# Patient Record
Sex: Female | Born: 1950
Health system: Southern US, Community
[De-identification: ages and names within clinical notes are randomized; demographics above are authoritative.]

## PROBLEM LIST (undated history)

## (undated) ENCOUNTER — Inpatient Hospital Stay (AMBULATORY_SURGERY_CENTER): Payer: Medicare Other | Admitting: Podiatry

## (undated) DIAGNOSIS — M1990 Unspecified osteoarthritis, unspecified site: Secondary | ICD-10-CM

## (undated) DIAGNOSIS — I499 Cardiac arrhythmia, unspecified: Secondary | ICD-10-CM

## (undated) DIAGNOSIS — G4700 Insomnia, unspecified: Secondary | ICD-10-CM

## (undated) DIAGNOSIS — F32A Depression, unspecified: Secondary | ICD-10-CM

## (undated) DIAGNOSIS — F419 Anxiety disorder, unspecified: Secondary | ICD-10-CM

## (undated) DIAGNOSIS — E039 Hypothyroidism, unspecified: Secondary | ICD-10-CM

## (undated) DIAGNOSIS — R51 Headache: Secondary | ICD-10-CM

## (undated) DIAGNOSIS — F329 Major depressive disorder, single episode, unspecified: Secondary | ICD-10-CM

## (undated) DIAGNOSIS — M549 Dorsalgia, unspecified: Secondary | ICD-10-CM

## (undated) DIAGNOSIS — R519 Headache, unspecified: Secondary | ICD-10-CM

## (undated) HISTORY — PX: TONSILLECTOMY: SUR1361

## (undated) HISTORY — DX: Headache: R51

## (undated) HISTORY — PX: KNEE ARTHROSCOPY: SUR90

## (undated) HISTORY — DX: Major depressive disorder, single episode, unspecified: F32.9

## (undated) HISTORY — DX: Headache, unspecified: R51.9

## (undated) HISTORY — PX: BUNIONECTOMY: SHX129

## (undated) HISTORY — DX: Dorsalgia, unspecified: M54.9

## (undated) HISTORY — DX: Hypothyroidism, unspecified: E03.9

## (undated) HISTORY — PX: CATARACT EXTRACTION, BILATERAL: SHX1313

## (undated) HISTORY — PX: TOE SURGERY: SHX1073

## (undated) HISTORY — DX: Depression, unspecified: F32.A

## (undated) HISTORY — PX: BLADDER SURGERY: SHX569

## (undated) HISTORY — DX: Anxiety disorder, unspecified: F41.9

## (undated) HISTORY — DX: Insomnia, unspecified: G47.00

---

## 2006-03-11 HISTORY — PX: REPLACEMENT TOTAL KNEE: SUR1224

## 2010-03-11 HISTORY — PX: REPLACEMENT TOTAL KNEE: SUR1224

## 2015-07-20 ENCOUNTER — Ambulatory Visit (INDEPENDENT_AMBULATORY_CARE_PROVIDER_SITE_OTHER)
Admission: RE | Admit: 2015-07-20 | Discharge: 2015-07-20 | Disposition: A | Discharge status: Home / Self Care | Payer: BLUE CROSS/BLUE SHIELD | Source: Ambulatory Visit | Attending: Pulmonary Disease | Admitting: Pulmonary Disease

## 2015-07-20 ENCOUNTER — Encounter: Payer: Self-pay | Admitting: Pulmonary Disease

## 2015-07-20 ENCOUNTER — Ambulatory Visit (INDEPENDENT_AMBULATORY_CARE_PROVIDER_SITE_OTHER): Payer: BLUE CROSS/BLUE SHIELD | Admitting: Pulmonary Disease

## 2015-07-20 VITALS — BP 118/72 | HR 90 | Ht 62.0 in | Wt 178.0 lb

## 2015-07-20 DIAGNOSIS — R0600 Dyspnea, unspecified: Secondary | ICD-10-CM

## 2015-07-20 DIAGNOSIS — R05 Cough: Secondary | ICD-10-CM | POA: Diagnosis not present

## 2015-07-20 DIAGNOSIS — R0609 Other forms of dyspnea: Secondary | ICD-10-CM | POA: Diagnosis not present

## 2015-07-20 DIAGNOSIS — R059 Cough, unspecified: Secondary | ICD-10-CM

## 2015-07-20 MED ORDER — RANITIDINE HCL 150 MG PO TABS: 150.0000 mg | ORAL_TABLET | Freq: Two times a day (BID) | ORAL | Status: DC

## 2015-07-20 MED ORDER — FLUTICASONE PROPIONATE 50 MCG/ACT NA SUSP: 2.0000 | Freq: Every day | NASAL | Status: DC

## 2015-07-20 MED ORDER — MONTELUKAST SODIUM 10 MG PO TABS: 10.0000 mg | ORAL_TABLET | Freq: Every day | ORAL | Status: DC

## 2015-07-20 NOTE — Progress Notes (Signed)
Subjective:    Patient ID: Debbie FlakeKathryn Berkowitz, female    DOB: 11/14/1950, 65 y.o.   MRN: [id number]  HPI   This is the case of Debbie Schultz, 65 y.o. Female, who was referred by Dr. Christia Readingwight Bates in consultation regarding chronic cough.   As you very well know, patient has had the cough since 2009.  She was fine until cough slowly crept up on her. Over all, cough has been the same since 2009. What's different is the cough the last 1-1.5 yrs sometimes causes her to have HA which gets better when she lays down.  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 Last 1.5 yrs, cough causes her to have HA. Lying down helps with HA.  She does not know exact cause or trigger for cough. Sometimes associated with nasal congestion/drip, but not all the time.  Not necessarily worse cough at night. She was treated with flonase for yrs -- not really sure if cough got better. Was also treated for GERD with PPI for yrs -- not really better.   [place] [facility] [place] and drove cross country, going to [place] and [place] then finally making it here in [place] in Dec 2016. She has had cats forever, even before the cough.  She is now staying with her sister. House is old, relatively clean house, has carpets. (-) Molds.  Cough is NOT worse here compared to [facility], not worse at her current house compared [place] [facility].   Saw ENT and was given neurontin  100 mg TID.  Neurontin mad her really sleepy.   She was in a lot of stress at work last year but it is better now.  Cough was not worse with stress.   Non smoker. Denies asthma or copd.   Review of Systems  Constitutional: Negative.  Negative for fever and unexpected weight change.       Gained weight x 20 lbs x 1.5 yrs.   HENT: Negative for congestion, dental problem, ear pain, nosebleeds, postnasal drip, rhinorrhea, sinus pressure, sneezing, sore throat and trouble swallowing.   Eyes: Negative.  Negative for redness and itching.   Respiratory: Positive for cough. Negative for chest tightness, shortness of breath and wheezing.   Cardiovascular: Negative.  Negative for palpitations and leg swelling.  Gastrointestinal: Negative.  Negative for nausea and vomiting.  Endocrine: Negative.   Genitourinary: Negative.  Negative for dysuria.  Musculoskeletal: Positive for joint swelling and arthralgias.  Skin: Negative.  Negative for rash.  Allergic/Immunologic: Negative.   Neurological: Positive for headaches.  Hematological: Negative.  Does not bruise/bleed easily.  Psychiatric/Behavioral: Negative.  Negative for dysphoric mood. The patient is not nervous/anxious.   All other systems reviewed and are negative.      No past medical history on file.  (-) CA, DVT. Has depression/anxiety.  GERD Arthritis Hypothyroidism  No family history on file.  Mother has osteoporosis. Sister is healthy. Has asthma.   No past surgical history on file.  Status post cataract surgery, bilateral knee surgery with replacement, tonsillectomy, wisdom tooth extraction.  Social History   Social History  . Marital Status: Unknown    Spouse Name: N/A  . Number of Children: N/A  . Years of Education: N/A   Occupational History  . Not on file.   Social History Main Topics  . Smoking status: Never Smoker   . Smokeless tobacco: Not on file  . Alcohol Use: Not on file  .  Drug Use: Not on file  . Sexual Activity: Not on file   Other Topics Concern  . Not on file   Social History Narrative  . No narrative on file     Allergies  Allergen Reactions  . Bupropion Rash  . Sulfamethoxazole Rash   Single, no children. 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 Lived in [place] from 1970s until 1996. She went to [place] in [phone]. Was in [place], [place] from [place] until 2016.  No outpatient prescriptions prior to visit.   No facility-administered medications prior to visit.   Meds ordered this encounter  Medications  .  busPIRone (BUSPAR) 5 MG tablet    Sig: Take 1 tablet by mouth 2 (two) times daily.    Refill:  0  . gabapentin (NEURONTIN) 100 MG capsule    Sig: Take 1 capsule by mouth 3 (three) times daily.    Refill:  0  . temazepam (RESTORIL) 15 MG capsule    Sig: Take 15 mg by mouth at bedtime.    Refill:  0  . traZODone (DESYREL) 100 MG tablet    Sig: Take 300 mg by mouth at bedtime.    Refill:  0  . venlafaxine XR (EFFEXOR-XR) 150 MG 24 hr capsule    Sig: Take 450 mg by mouth at bedtime.    Refill:  0  . Omega-3 Fatty Acids (OMEGA 3 PO)    Sig: Take by mouth.  . Zinc Sulfate (ZINC 15 PO)    Sig: Take by mouth.  Marland Kitchen PROPRANOLOL HCL PO    Sig: Take 10 mg by mouth 3 (three) times daily.  . fluticasone (FLONASE) 50 MCG/ACT nasal spray    Sig: Place 2 sprays into both nostrils daily.    Dispense:  16 g    Refill:  5  . montelukast (SINGULAIR) 10 MG tablet    Sig: Take 1 tablet (10 mg total) by mouth at bedtime.    Dispense:  30 tablet    Refill:  5  . ranitidine (ZANTAC) 150 MG tablet    Sig: Take 1 tablet (150 mg total) by mouth 2 (two) times daily.    Dispense:  60 tablet    Refill:  5       Objective:   Physical Exam  Vitals:  Filed Vitals:   07/20/15 1347  BP: 118/72  Pulse: 90  Height: 5\' 2"  (1.575 m)  Weight: 178 lb (80.74 kg)  SpO2: 97%    Constitutional/General:  Pleasant, well-nourished, well-developed, not in any distress,  Comfortably seating.  Well kempt  Body mass index is 32.55 kg/(m^2). Wt Readings from Last 3 Encounters:  07/20/15 178 lb (80.74 kg)    HEENT: Pupils equal and reactive to light and accommodation. Anicteric sclerae. Normal nasal mucosa.   No oral  lesions,  mouth clear,  oropharynx clear, no postnasal drip. (-) Oral thrush. No dental caries.  Airway - Mallampati class III  Neck: No masses. Midline trachea. No JVD, (-) LAD. (-) bruits appreciated.  Respiratory/Chest: Grossly normal chest. (-) deformity. (-) Accessory muscle use.    Symmetric expansion. (-) Tenderness on palpation.  Resonant on percussion.  Diminished BS on both lower lung zones. (-) wheezing, crackles, rhonchi (-) egophony  Cardiovascular: Regular rate and  rhythm, heart sounds normal, no murmur or gallops, no peripheral edema  Gastrointestinal:  Normal bowel sounds. Soft, non-tender. No hepatosplenomegaly.  (-) masses.   Musculoskeletal:  Normal muscle tone. Normal gait.   Extremities: Grossly  normal. (-) clubbing, cyanosis.  (-) edema  Skin: (-) rash,lesions seen.   Neurological/Psychiatric : alert, oriented to time, place, person. Normal mood and affect            Assessment & Plan:  Cough Pt is being seen for cough. Cough since 2009, not affected by environment. No meds implicated. Moved from [place] to [place], moved houses, cough still the same. Was in  A lot of stress last yr > cough the same.  Has occasional sinus issues/throat clearing.  Drinking carbonated drinks makes her cough better.  Cough, etiology: Likely still multifactorial 1. UACS. Had frequent throat clearing during interview. Re start flonase 2 sq at hs. Start singulair 10 mg hs. Start Delsym 10 mls BID.  2. GERD. Try zantac 150 BID. Zantac will aslo work on allergies.  Diet changes.  3. Possible hyperreactive airway dse. May try symbicort or spiriva if not better.   Plan to hold off on neurontin as it makes her sleepy.  Plan for CXR.   Exertional dyspnea Recent SOB likely 2/2 obesity. Check CXR. May need PFT/ABG.      Thank you very much for letting me participate in this patient's care. Please do not hesitate to give me a call if you have any questions or concerns regarding the treatment plan.   Patient will follow up with me in 2-3 nus    J. Alexis Frock, MD 07/20/2015   10:14 PM Pulmonary and Critical Care Medicine [facility] Pager: [phone] Office: [phone], Fax: [phone]

## 2015-07-20 NOTE — Assessment & Plan Note (Signed)
Recent SOB likely 2/2 obesity. Check CXR. May need PFT/ABG.

## 2015-07-20 NOTE — Patient Instructions (Signed)
1. Start Flonase, 2 squirts per nostril at bedtime. 2. Start Singulair, 10 mg per tablet, 1 tablet at bedtime. 3. Start Zantac, 150 mg per tablet, 1 tablet twice a day. 4. Start Delsym, 10 ML's, twice a day. 5. We will get a chest x-ray today.  Return to clinic in 6 weeks.

## 2015-07-20 NOTE — Assessment & Plan Note (Signed)
Pt is being seen for cough. Cough since 2009, not affected by environment. No meds implicated. Moved from [place] to [place], moved houses, cough still the same. Was in  A lot of stress last yr > cough the same.  Has occasional sinus issues/throat clearing.  Drinking carbonated drinks makes her cough better.  Cough, etiology: Likely still multifactorial 1. UACS. Had frequent throat clearing during interview. Re start flonase 2 sq at hs. Start singulair 10 mg hs. Start Delsym 10 mls BID.  2. GERD. Try zantac 150 BID. Zantac will aslo work on allergies.  Diet changes.  3. Possible hyperreactive airway dse. May try symbicort or spiriva if not better.   Plan to hold off on neurontin as it makes her sleepy.  Plan for CXR.

## 2015-07-25 ENCOUNTER — Telehealth: Payer: Self-pay | Admitting: Pulmonary Disease

## 2015-07-25 NOTE — Telephone Encounter (Signed)
Spoke with pt and gave cxr results. Nothing further needed.  

## 2015-08-21 ENCOUNTER — Ambulatory Visit: Payer: BLUE CROSS/BLUE SHIELD | Admitting: Pulmonary Disease

## 2015-08-24 ENCOUNTER — Encounter (INDEPENDENT_AMBULATORY_CARE_PROVIDER_SITE_OTHER): Payer: Self-pay

## 2015-08-24 ENCOUNTER — Encounter: Payer: Self-pay | Admitting: Acute Care

## 2015-08-24 ENCOUNTER — Ambulatory Visit (INDEPENDENT_AMBULATORY_CARE_PROVIDER_SITE_OTHER): Payer: BLUE CROSS/BLUE SHIELD | Admitting: Acute Care

## 2015-08-24 DIAGNOSIS — R059 Cough, unspecified: Secondary | ICD-10-CM

## 2015-08-24 DIAGNOSIS — R05 Cough: Secondary | ICD-10-CM

## 2015-08-24 LAB — NITRIC OXIDE: Nitric Oxide: 13

## 2015-08-24 MED ORDER — PREDNISONE 10 MG PO TABS: 10.0000 mg | ORAL_TABLET | Freq: Every day | ORAL | Status: DC

## 2015-08-24 NOTE — Patient Instructions (Addendum)
We will prescribe a Prednisone taper; 10 mg tablets: 4 tabs x 2 days, 3 tabs x 2 days, 2 tabs x 2 days 1 tab x 2 days then stop. We will do a Nitrous Oxide Test in the office today. This was normal. Please use sugar free jolly rancher hard candy to soothe your throat. Avoid mint menthol and chocolate. See if cough is better off Fish oil. Use sips of water instead of throat clearing. Continue the zantac, Flonase,and Singulair. Follow up in 3 weeks  Please contact office for sooner follow up if symptoms do not improve or worsen or seek emergency care

## 2015-08-24 NOTE — Assessment & Plan Note (Signed)
Cough since 2009 FeNO today was 13 Compliant with Flonase and Singulair and Zantac Non-compliant with Delsym No Improvement in symptoms with this plan. Plan: Prednisone taper; 10 mg tablets: 4 tabs x 2 days, 3 tabs x 2 days, 2 tabs x 2 days 1 tab x 2 days then stop. Please use sugar free jolly rancher hard candy to soothe your throat. Avoid mint menthol and chocolate. See if cough is better off Fish oil. Use sips of water instead of throat clearing. Continue the zantac, Flonase,and Singulair. Follow up in 3 weeks  Please contact office for sooner follow up if symptoms do not improve or worsen or seek emergency care   Consider adding Symbicort after trial if improvement on pred. Consider PFT's

## 2015-08-24 NOTE — Progress Notes (Signed)
History of Present Illness Debbie FlakeKathryn Revell is a 65 y.o. female with chronic cough since 2009. She is followed by Dr. Christene Slatese Dios.    6/15/2017Follow up Dr. Christene Slatese Dios 07/20/2015.   In May Dr. Christene Slatese Dios prescribed Zantac and Singulair and Flonase for patient's chronic cough..He had her stop the Neurontin she was taking at that time.Patient states that she has had no relief or improvement in her cough.She was sleepy using the zantac, but has started taking it at night and this has been better. She has been compliant with the Flonase and Singulair. She is not using the Delsym because it made her too sleepy.She is currently taking fish oil and commented on using Eucalypts oil. She denies fever, chest pain, productive cough, leg or calf pain.She has been throat clearing throughout the visit.  Tests Nitrous Oxide : 13  CXR done 07/20/2015:  No active cardiopulmonary disease.   Past medical hx No past medical history on file.   Past surgical hx, Family hx, Social hx all reviewed.  Current Outpatient Prescriptions on File Prior to Visit  Medication Sig  . busPIRone (BUSPAR) 5 MG tablet Take 1 tablet by mouth 2 (two) times daily.  . fluticasone (FLONASE) 50 MCG/ACT nasal spray Place 2 sprays into both nostrils daily.  . montelukast (SINGULAIR) 10 MG tablet Take 1 tablet (10 mg total) by mouth at bedtime.  . Omega-3 Fatty Acids (OMEGA 3 PO) Take by mouth.  Marland Kitchen. PROPRANOLOL HCL PO Take 10 mg by mouth 3 (three) times daily.  . temazepam (RESTORIL) 15 MG capsule Take 15 mg by mouth at bedtime.  . traZODone (DESYREL) 100 MG tablet Take 300 mg by mouth at bedtime.  Marland Kitchen. venlafaxine XR (EFFEXOR-XR) 150 MG 24 hr capsule Take 450 mg by mouth at bedtime.  . Zinc Sulfate (ZINC 15 PO) Take by mouth.   No current facility-administered medications on file prior to visit.     Allergies  Allergen Reactions  . Bupropion Rash  . Sulfamethoxazole Rash    Review Of Systems:  Constitutional:   No  weight loss,  night sweats,  Fevers, chills, fatigue, or  lassitude.  HEENT:   +headaches,  No Difficulty swallowing,  Tooth/dental problems, or  Sore throat,                No sneezing, itching, ear ache, nasal congestion, +post nasal drip,   CV:  No chest pain,  Orthopnea, PND, swelling in lower extremities, anasarca, dizziness, palpitations, syncope.   GI  No heartburn, indigestion, abdominal pain, nausea, vomiting, diarrhea, change in bowel habits, loss of appetite, bloody stools.   Resp: No shortness of breath with exertion or at rest.  No excess mucus, no productive cough,  + non-productive cough,  No coughing up of blood.  No change in color of mucus.  No wheezing.  No chest wall deformity  Skin: no rash or lesions.  GU: no dysuria, change in color of urine, no urgency or frequency.  No flank pain, no hematuria   MS:  No joint pain or swelling.  No decreased range of motion.  No back pain.  Psych:  No change in mood or affect. No depression or anxiety.  No memory loss.   Vital Signs BP 106/70 mmHg  Pulse 79  Temp(Src) 98.8 F (37.1 C) (Oral)  Ht 5\' 2"  (1.575 m)  Wt 174 lb 6.4 oz (79.107 kg)  BMI 31.89 kg/m2  SpO2 96%   Physical Exam:  General- No distress,  A&Ox3, pleasant ENT: No sinus tenderness, TM clear, pale nasal mucosa, no oral exudate,no post nasal drip, no LAN Cardiac: S1, S2, regular rate and rhythm, no murmur Chest: No wheeze/ rales/ dullness; no accessory muscle use, no nasal flaring, no sternal retractions Abd.: Soft Non-tender Ext: No clubbing cyanosis, edema Neuro:  normal strength Skin: No rashes, warm and dry Psych: normal mood and behavior   Assessment/Plan  Cough Cough since 2009 FeNO today was 13 Compliant with Flonase and Singulair and Zantac Non-compliant with Delsym No Improvement in symptoms with this plan. Plan: Prednisone taper; 10 mg tablets: 4 tabs x 2 days, 3 tabs x 2 days, 2 tabs x 2 days 1 tab x 2 days then stop. Please use sugar free  jolly rancher hard candy to soothe your throat. Avoid mint menthol and chocolate. See if cough is better off Fish oil. Use sips of water instead of throat clearing. Continue the zantac, Flonase,and Singulair. Follow up in 3 weeks  Please contact office for sooner follow up if symptoms do not improve or worsen or seek emergency care   Consider adding Symbicort after trial if improvement on pred. Consider PFT's     Bevelyn Ngo, NP 08/24/2015  10:14 AM

## 2015-08-31 ENCOUNTER — Ambulatory Visit: Payer: BLUE CROSS/BLUE SHIELD | Admitting: Acute Care

## 2015-09-14 ENCOUNTER — Encounter: Payer: Self-pay | Admitting: Acute Care

## 2015-09-14 ENCOUNTER — Ambulatory Visit (INDEPENDENT_AMBULATORY_CARE_PROVIDER_SITE_OTHER): Payer: BLUE CROSS/BLUE SHIELD | Admitting: Acute Care

## 2015-09-14 VITALS — BP 118/86 | HR 92 | Ht 62.0 in | Wt 172.6 lb

## 2015-09-14 DIAGNOSIS — R05 Cough: Secondary | ICD-10-CM | POA: Diagnosis not present

## 2015-09-14 DIAGNOSIS — R059 Cough, unspecified: Secondary | ICD-10-CM

## 2015-09-14 MED ORDER — BUDESONIDE-FORMOTEROL FUMARATE 80-4.5 MCG/ACT IN AERO: 2.0000 | INHALATION_SPRAY | Freq: Two times a day (BID) | RESPIRATORY_TRACT | Status: DC

## 2015-09-14 NOTE — Assessment & Plan Note (Signed)
   Complete resolution of cough of with prednisone taper. Plan: We will add Symbicort 2 puffs twice daily. as maintenance treatment for your cough as it responded so well to prednisone. We will schedule you for Pulmonary Function Tests to check for asthma. Add Culturette Probiotics once daily. Please use sugar free jolly rancher hard candy to soothe your throat. Avoid mint menthol and chocolate. Continue to avoid fish oil if possible. Use sips of water instead of throat clearing. Continue the zantac, Flonase,and Singulair. Follow up in 3 months after PFT's Please contact office for sooner follow up if symptoms do not improve or worsen or seek emergency care

## 2015-09-14 NOTE — Patient Instructions (Addendum)
It is great to see you today.  I am so glad you are feeling better. We will add Symbicort 2 puffs twice daily. as maintenance treatment for your cough as it responded so well to prednisone. We will schedule you for Pulmonary Function Tests to check for asthma. Add Culturette Probiotics once daily. Please use sugar free jolly rancher hard candy to soothe your throat. Avoid mint menthol and chocolate. Continue to avoid fish oil if possible. Use sips of water instead of throat clearing. Continue the zantac, Flonase,and Singulair. Follow up in 3 months after PFT's Please contact office for sooner follow up if symptoms do not improve or worsen or seek emergency care

## 2015-09-14 NOTE — Progress Notes (Signed)
History of Present Illness Debbie Schultz is a 65 y.o. female with chronic cough since 2009. She is followed by Dr. Christene Slatese Dios.   7/6/2017Follow up appointment for cough Pt. Presents to the office today with complete resolution of cough after treatment with prednisone. She states she rarely coughs. Headache is completely resolved. She has continued treatment with Zantac, Singulair and Flonase.She is implementing sips of water instead of throat clearing, she is avoiding mint , chocolate and menthol, and stopped fish oil. She is interested in starting Symbicort inhalers as the cough was so responsive to prednisone.She denies fever, chest pain, orthopnea or hemoptysis.  Tests CXR 07/20/2015  FINDINGS: No active infiltrate or effusion is seen. Mediastinal and hilar contours are unremarkable. The heart is within normal limits in size. There are degenerative changes throughout the thoracic spine.  IMPRESSION: No active cardiopulmonary disease  Past medical hx No past medical history on file.   Past surgical hx, Family hx, Social hx all reviewed.  Current Outpatient Prescriptions on File Prior to Visit  Medication Sig  . busPIRone (BUSPAR) 5 MG tablet Take 1 tablet by mouth 2 (two) times daily.  . fluticasone (FLONASE) 50 MCG/ACT nasal spray Place 2 sprays into both nostrils daily.  . montelukast (SINGULAIR) 10 MG tablet Take 1 tablet (10 mg total) by mouth at bedtime.  . Multiple Vitamin (MULTIVITAMIN) capsule Take 1 capsule by mouth daily.  . Omega-3 Fatty Acids (OMEGA 3 PO) Take by mouth.  Marland Kitchen. PROPRANOLOL HCL PO Take 10 mg by mouth 3 (three) times daily.  . ranitidine (ZANTAC) 150 MG tablet Take 150 mg by mouth at bedtime.  . temazepam (RESTORIL) 15 MG capsule Take 15 mg by mouth at bedtime.  . traZODone (DESYREL) 100 MG tablet Take 300 mg by mouth at bedtime.  Marland Kitchen. venlafaxine XR (EFFEXOR-XR) 150 MG 24 hr capsule Take 450 mg by mouth at bedtime.  . Zinc Sulfate (ZINC 15 PO) Take by  mouth.   No current facility-administered medications on file prior to visit.     Allergies  Allergen Reactions  . Bupropion Rash  . Sulfamethoxazole Rash    Review Of Systems:  Constitutional:   No  weight loss, night sweats,  Fevers, chills, fatigue, or  lassitude.  HEENT:   No headaches,  Difficulty swallowing,  Tooth/dental problems, or  Sore throat,                No sneezing, itching, ear ache, nasal congestion, post nasal drip,   CV:  No chest pain,  Orthopnea, PND, swelling in lower extremities, anasarca, dizziness, palpitations, syncope.   GI  No heartburn, indigestion, abdominal pain, nausea, vomiting, diarrhea, change in bowel habits, loss of appetite, bloody stools.   Resp: No shortness of breath with exertion or at rest.  No excess mucus, no productive cough,  No non-productive cough,  No coughing up of blood.  No change in color of mucus.  No wheezing.  No chest wall deformity  Skin: no rash or lesions.  GU: no dysuria, change in color of urine, no urgency or frequency.  No flank pain, no hematuria   MS:  No joint pain or swelling.  No decreased range of motion.  No back pain.  Psych:  No change in mood or affect. No depression or anxiety.  No memory loss.   Vital Signs BP 118/86 mmHg  Pulse 92  Ht 5\' 2"  (1.575 m)  Wt 172 lb 9.6 oz (78.291 kg)  BMI 31.56 kg/m2  SpO2 95%   Physical Exam:  General- No distress,  A&Ox3, pleasant ENT: No sinus tenderness, TM clear, pale nasal mucosa, no oral exudate,no post nasal drip, no LAN Cardiac: S1, S2, regular rate and rhythm, no murmur Chest: No wheeze/ rales/ dullness; no accessory muscle use, no nasal flaring, no sternal retractions Abd.: Soft Non-tender Ext: No clubbing cyanosis, edema Neuro:  normal strength Skin: No rashes, warm and dry Psych: normal mood and behavior   Assessment/Plan  Cough   Complete resolution of cough of with prednisone taper. Plan: We will add Symbicort 2 puffs twice daily. as  maintenance treatment for your cough as it responded so well to prednisone. We will schedule you for Pulmonary Function Tests to check for asthma. Add Culturette Probiotics once daily. Please use sugar free jolly rancher hard candy to soothe your throat. Avoid mint menthol and chocolate. Continue to avoid fish oil if possible. Use sips of water instead of throat clearing. Continue the zantac, Flonase,and Singulair. Follow up in 3 months after PFT's Please contact office for sooner follow up if symptoms do not improve or worsen or seek emergency care       Debbie NgoSarah F Groce, NP 09/14/2015  10:12 AM

## 2015-09-14 NOTE — Addendum Note (Signed)
Addended by: Maisie FusGREEN, Jaryd Drew M on: 09/14/2015 10:23 AM   Modules accepted: Orders

## 2015-09-14 NOTE — Addendum Note (Signed)
Addended by: Maisie FusGREEN, Hilman Kissling M on: 09/14/2015 10:22 AM   Modules accepted: Orders, Medications

## 2015-09-22 ENCOUNTER — Telehealth: Payer: Self-pay | Admitting: Acute Care

## 2015-09-22 MED ORDER — FLUTICASONE-SALMETEROL 115-21 MCG/ACT IN AERO: 2.0000 | INHALATION_SPRAY | Freq: Two times a day (BID) | RESPIRATORY_TRACT | Status: DC

## 2015-09-22 NOTE — Telephone Encounter (Signed)
Spoke with pt, states that symbicort is making her nauseous and worsening her cough.  I've advised pt to stop taking medication.  Pt is requesting alternative inhaler to be sent to below verified pharmacy.    SG please advise.  Thanks!

## 2015-09-22 NOTE — Telephone Encounter (Signed)
Pt aware of recs.  rx sent to pharmacy.  Nothing further needed.  

## 2015-09-22 NOTE — Telephone Encounter (Signed)
Please send in a prescription for Advair HFA 115/21 with instructions to inhale 2 puffs twice daily. Please tell her to let us know if this problem persists on the new inhaler so that we can see her in the office if necessary. Thanks

## 2015-09-28 ENCOUNTER — Telehealth: Payer: Self-pay | Admitting: Acute Care

## 2015-09-28 NOTE — Telephone Encounter (Signed)
Spoke with pt  She states cough has been worse since last visit  She coughs until she gets severe HA's  OV with MW for 4:30 pm tomorrow with all meds in hand

## 2015-09-29 ENCOUNTER — Ambulatory Visit (INDEPENDENT_AMBULATORY_CARE_PROVIDER_SITE_OTHER): Payer: BLUE CROSS/BLUE SHIELD | Admitting: Internal Medicine

## 2015-09-29 ENCOUNTER — Encounter: Payer: Self-pay | Admitting: Internal Medicine

## 2015-09-29 VITALS — BP 130/76 | HR 101 | Ht 62.0 in | Wt 160.0 lb

## 2015-09-29 DIAGNOSIS — R05 Cough: Secondary | ICD-10-CM

## 2015-09-29 DIAGNOSIS — R059 Cough, unspecified: Secondary | ICD-10-CM

## 2015-09-29 MED ORDER — PANTOPRAZOLE SODIUM 40 MG PO TBEC: 40.0000 mg | DELAYED_RELEASE_TABLET | Freq: Every day | ORAL | Status: DC

## 2015-09-29 MED ORDER — PREDNISONE 10 MG PO TABS: ORAL_TABLET | ORAL | Status: DC

## 2015-09-29 NOTE — Progress Notes (Signed)
Subjective:    Patient ID: Debbie Schultz, female    DOB: 1950-12-28, 65 y.o.   MRN: [id number]  HPI   This is the case of Debbie Schultz, 65 y.o. Female, who was referred by Dr. Christia Reading in consultation regarding chronic cough.   patient has had the cough since 2009.  She was fine until cough slowly crept up on her. Over all, cough has been the same since 2009. What's different is the cough the last 1-1.5 yrs sometimes causes her to have HA which gets better when she lies down.  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 Last 1.5 yrs, cough causes her to have HA. Lying down helps with HA.  She does not know exact cause or trigger for cough. Sometimes associated with nasal congestion/drip, but not all the time.  Not necessarily worse cough at night. She was treated with flonase for yrs -- not really sure if cough got better. Was also treated for GERD with PPI for yrs -- not really better.   She left [facility] in Oct 2016 and drove cross country, going to [place] and [place] then finally making it to [place] in Dec 2016. She has had cats forever, even before the cough.  She is now staying with her sister. House is old, relatively clean house, has carpets. (-) Molds.  Cough is NOT worse here compared to [facility], not worse at her current house compared to house in [facility].   Saw ENT and was given neurontin  100 mg TID.  Neurontin made her really sleepy.  07/20/15 recs 1. Start Flonase, 2 squirts per nostril at bedtime. 2. Start Singulair, 10 mg per tablet, 1 tablet at bedtime. 3. Start Zantac, 150 mg per tablet, 1 tablet twice a day. 4. Start Delsym, 10 ML's, twice a day.  08/24/15 recs   Prednisone taper; 10 mg tablets: 4 tabs x 2 days, 3 tabs x 2 days, 2 tabs x 2 days 1 tab x 2 days then stop.   Nitrous Oxide Test in the office  was normal. Please use sugar free jolly rancher hard candy to soothe your throat. Avoid mint menthol and chocolate. See if cough is  better off Fish oil. Use sips of water instead of throat clearing. Continue the zantac, Flonase,and Singulair.   09/14/15 rec  We will add Symbicort 2 puffs twice daily. as maintenance treatment for your cough as it responded so well to prednisone. We will schedule you for Pulmonary Function Tests to check for asthma. Add Culturette Probiotics once daily. Please use sugar free jolly rancher hard candy to soothe your throat. Avoid mint menthol and chocolate. Continue to avoid fish oil if possible. Use sips of water instead of throat clearing. Continue the zantac, Flonase,and Singulair. Follow up in 3 months after PFT's     09/29/2015 acute extended ov/Katherene Dinino re:  Chronic cough  X  8 years transiently better on pred but not symbicort or advair Chief Complaint  Patient presents with  . Acute Visit    Pt c/o cough for the past few wks. Cough is non prod "deep and hacky". She coughs until she gets a severe HA.   started propranol for high heart rate x 2 y prior to OV   Cough is day and noct = and triggered by using voice   No obvious day to day or daytime variability or assoc sob or excess/ purulent sputum or mucus plugs or hemoptysis or cp or chest  tightness, subjective wheeze or overt sinus or hb symptoms. No unusual exp hx or h/o childhood pna/ asthma or knowledge of premature birth.  Sleeping ok without nocturnal  or early am exacerbation  of respiratory  c/o's or need for noct saba. Also denies any obvious fluctuation of symptoms with weather or environmental changes or other aggravating or alleviating factors except as outlined above   Current Medications, Allergies, Complete Past Medical History, Past Surgical History, Family History, and Social History were reviewed in [facility] electronic medical record.  ROS  The following are not active complaints unless bolded sore throat, dysphagia, dental problems, itching, sneezing,  nasal congestion or excess/ purulent secretions, ear  ache,   fever, chills, sweats, unintended wt loss, classically pleuritic or exertional cp,  orthopnea pnd or leg swelling, presyncope, palpitations, abdominal pain, anorexia, nausea, vomiting, diarrhea  or change in bowel or bladder habits, change in stools or urine, dysuria,hematuria,  rash, arthralgias, visual complaints, headache, numbness, weakness or ataxia or problems with walking or coordination,  change in mood/affect or memory.                     No past medical history on file.  (-) CA, DVT. Has depression/anxiety.  GERD Arthritis Hypothyroidism  No family history on file.  Mother has osteoporosis. Sister is healthy. Has asthma.   No past surgical history on file.  Status post cataract surgery, bilateral knee surgery with replacement, tonsillectomy, wisdom tooth extraction.     Objective:   Physical Exam   amb wf very hoarse / nad freq throat clearing   Wt Readings from Last 3 Encounters:  09/29/15 160 lb (72.576 kg)  09/14/15 172 lb 9.6 oz (78.291 kg)  08/24/15 174 lb 6.4 oz (79.107 kg)    Vital signs reviewed    Constitutional/General:  Pleasant, well-nourished, well-developed, not in any distress,  Comfortably seating.  Well kempt  HEENT: Pupils equal and reactive to light and accommodation. Anicteric sclerae. Normal nasal mucosa.   No oral  lesions,  mouth clear,  oropharynx clear, no postnasal drip. (-) Oral thrush. No dental caries.  Airway - Mallampati class III  Neck: No masses. Midline trachea. No JVD, (-) LAD. (-) bruits appreciated.  Respiratory/Chest: Grossly normal chest. (-) deformity. (-) Accessory muscle use.  Clear to A and P with cough tending to occur early on insp  Cardiovascular: Regular rate and  rhythm, heart sounds normal, no murmur or gallops, no peripheral edema  Gastrointestinal:  Normal bowel sounds. Soft, non-tender. No hepatosplenomegaly.  (-) masses.   Musculoskeletal:  Normal muscle tone. Normal gait.    Extremities: Grossly normal. (-) clubbing, cyanosis.  (-) edema  Skin: (-) rash,lesions seen.   Neurological/Psychiatric : alert, oriented to time, place, person. Normal mood and affect       I personally reviewed images and agree with radiology impression as follows:  CXR:  07/20/15  No active infiltrate or effusion is seen. Mediastinal and hilar contours are unremarkable. The heart is within normal limits in size. There are degenerative changes throughout the thoracic spine.     Assessment & Plan:

## 2015-09-29 NOTE — Patient Instructions (Addendum)
Prednisone 10 mg take  4 each am x 2 days,   2 each am x 2 days,  1 each am x 2 days and stop   Pantoprazole (protonix) 40 mg   Take  30-60 min before first meal of the day and  Zantac 150 @  bedtime until return to office - this is the best way to tell whether stomach acid is contributing to your problem.    GERD (REFLUX)  is an extremely common cause of respiratory symptoms just like yours , many times with no obvious heartburn at all.    It can be treated with medication, but also with lifestyle changes including elevation of the head of your bed (ideally with 6 inch  bed blocks),  Smoking cessation, avoidance of late meals, excessive alcohol, and avoid fatty foods, chocolate, peppermint, colas, red wine, and acidic juices such as orange juice.  NO MINT OR MENTHOL PRODUCTS SO NO COUGH DROPS  USE SUGARLESS CANDY INSTEAD (Jolley ranchers or Stover's or Life Savers) or even ice chips will also do - the key is to swallow to prevent all throat clearing. NO OIL BASED VITAMINS - use powdered substitutes.  For drainage / throat tickle try take CHLORPHENIRAMINE  4 mg - take one every 4 hours as needed - available over the counter- may cause drowsiness so start with just a bedtime dose or two and see how you tolerate it before trying in daytime     Stop inhalers  - may need to stop propranol (highest incidence of asthma of all the beta blockers)  Keep follow up

## 2015-10-01 ENCOUNTER — Encounter: Payer: Self-pay | Admitting: Internal Medicine

## 2015-10-01 NOTE — Assessment & Plan Note (Addendum)
Reported improvement in cough on pred but not maintained on symbicort 80 2bid with low baseline FENO  The most common causes of chronic cough in immunocompetent adults include the following: upper airway cough syndrome (UACS), previously referred to as postnasal drip syndrome (PNDS), which is caused by variety of rhinosinus conditions; (2) asthma; (3) GERD; (4) chronic bronchitis from cigarette smoking or other inhaled environmental irritants; (5) nonasthmatic eosinophilic bronchitis; and (6) bronchiectasis.   These conditions, singly or in combination, have accounted for up to 94% of the causes of chronic cough in prospective studies.   Other conditions have constituted no >6% of the causes in prospective studies These have included bronchogenic carcinoma, chronic interstitial pneumonia, sarcoidosis, left ventricular failure, ACEI-induced cough, and aspiration from a condition associated with pharyngeal dysfunction.    Chronic cough is often simultaneously caused by more than one condition. A single cause has been found from 38 to 82% of the time, multiple causes from 18 to 62%. Multiply caused cough has been the result of three diseases up to 42% of the time.   Lack of cough resolution on a verified empirical regimen could mean an alternative diagnosis (not cough variant asthma, though would need methacholine challenge to compltely r/o) persistence of the disease state (eg sinusitis or bronchiectasis) , or inadequacy of currently available therapy (eg no medical rx available for non-acid gerd)   Despite pred response and chornic exp to propranolol most likely this is not asthma but  Upper airway cough syndrome, so named because it's frequently impossible to sort out how much is  CR/sinusitis with freq throat clearing (which can be related to primary GERD)   vs  causing  secondary (" extra esophageal")  GERD from wide swings in gastric pressure that occur with throat clearing, often  promoting self use  of mint and menthol lozenges that reduce the lower esophageal sphincter tone and exacerbate the problem further in a cyclical fashion.   These are the same pts (now being labeled as having "irritable larynx syndrome" by some cough centers) who not infrequently have a history of having failed to tolerate ace inhibitors,  dry powder inhalers (and sometimes even low doses of ICS) or biphosphonates or report having atypical reflux symptoms that don't respond to standard doses of PPI , and are easily confused as having aecopd or asthma flares by even experienced allergists/ pulmonologists.    rec rechallenge with prednisone but this time no symbicort (which may aggravate uacs) and max rx for gerd and pnds with 1st gen antihistamines per protocol  I had an extended discussion with the patient reviewing all relevant studies completed to date and  lasting  25 minutes of a acute visit focusing on recurrent severe refractory symptom control.  The standardized cough guidelines published in Chest by [place] in 2006 are still the best available and consist of a multiple step process (up to 12!) , not a single office visit,  and are intended  to address this problem logically,  with an alogrithm dependent on response to empiric treatment at  each progressive step  to determine a specific diagnosis with  minimal addtional testing needed. Therefore if adherence is an issue or can't be accurately verified,  it's very unlikely the standard evaluation and treatment will be successful here.    Furthermore, response to therapy (other than acute cough suppression, which should only be used short term with avoidance of narcotic containing cough syrups if possible), can be a gradual process for which  the patient is not likely to  perceive immediate benefit.  Unlike going to an eye doctor where the best perscription is almost always the first one and is immediately effective, this is almost never the case in the  management of chronic cough syndromes. Therefore the patient needs to commit up front to consistently adhere to recommendations  for up to 6 weeks of therapy directed at the likely underlying problem(s) before the response can be reasonably evaluated.   Each maintenance medication was reviewed in detail including most importantly the difference between maintenance and as needed and under what circumstances the prns are to be used.  Please see instructions for details which were reviewed in writing and the patient given a copy.

## 2015-10-09 ENCOUNTER — Other Ambulatory Visit: Payer: Self-pay | Admitting: Family

## 2015-10-09 DIAGNOSIS — G4483 Primary cough headache: Secondary | ICD-10-CM

## 2015-10-27 ENCOUNTER — Other Ambulatory Visit: Payer: BLUE CROSS/BLUE SHIELD

## 2015-11-07 ENCOUNTER — Ambulatory Visit
Admission: RE | Admit: 2015-11-07 | Discharge: 2015-11-07 | Disposition: A | Discharge status: Home / Self Care | Payer: Medicare Other | Source: Ambulatory Visit | Attending: Family | Admitting: Family

## 2015-11-07 DIAGNOSIS — G4483 Primary cough headache: Secondary | ICD-10-CM

## 2015-11-29 ENCOUNTER — Encounter (INDEPENDENT_AMBULATORY_CARE_PROVIDER_SITE_OTHER): Payer: Medicare Other | Admitting: Pulmonary Disease

## 2015-11-29 ENCOUNTER — Encounter: Payer: Self-pay | Admitting: Pulmonary Disease

## 2015-11-29 ENCOUNTER — Ambulatory Visit (INDEPENDENT_AMBULATORY_CARE_PROVIDER_SITE_OTHER): Payer: Medicare Other | Admitting: Pulmonary Disease

## 2015-11-29 ENCOUNTER — Other Ambulatory Visit (INDEPENDENT_AMBULATORY_CARE_PROVIDER_SITE_OTHER): Payer: Medicare Other

## 2015-11-29 VITALS — BP 122/72 | HR 90 | Ht 62.0 in | Wt 171.0 lb

## 2015-11-29 DIAGNOSIS — R059 Cough, unspecified: Secondary | ICD-10-CM

## 2015-11-29 DIAGNOSIS — R05 Cough: Secondary | ICD-10-CM

## 2015-11-29 DIAGNOSIS — Z23 Encounter for immunization: Secondary | ICD-10-CM | POA: Diagnosis not present

## 2015-11-29 DIAGNOSIS — E669 Obesity, unspecified: Secondary | ICD-10-CM

## 2015-11-29 DIAGNOSIS — R0600 Dyspnea, unspecified: Secondary | ICD-10-CM | POA: Diagnosis not present

## 2015-11-29 DIAGNOSIS — R0609 Other forms of dyspnea: Secondary | ICD-10-CM

## 2015-11-29 LAB — CBC WITH DIFFERENTIAL/PLATELET
Basophils Absolute: 0 10*3/uL (ref 0.0–0.1)
Basophils Relative: 0.4 % (ref 0.0–3.0)
EOS ABS: 0.2 10*3/uL (ref 0.0–0.7)
Eosinophils Relative: 2.4 % (ref 0.0–5.0)
HCT: 39.9 % (ref 36.0–46.0)
HEMOGLOBIN: 13.9 g/dL (ref 12.0–15.0)
LYMPHS ABS: 2 10*3/uL (ref 0.7–4.0)
Lymphocytes Relative: 27.1 % (ref 12.0–46.0)
MCHC: 34.7 g/dL (ref 30.0–36.0)
MCV: 91.2 fl (ref 78.0–100.0)
MONO ABS: 0.5 10*3/uL (ref 0.1–1.0)
Monocytes Relative: 7.1 % (ref 3.0–12.0)
NEUTROS PCT: 63 % (ref 43.0–77.0)
Neutro Abs: 4.6 10*3/uL (ref 1.4–7.7)
Platelets: 292 10*3/uL (ref 150.0–400.0)
RBC: 4.38 Mil/uL (ref 3.87–5.11)
RDW: 12.7 % (ref 11.5–15.5)
WBC: 7.3 10*3/uL (ref 4.0–10.5)

## 2015-11-29 LAB — PULMONARY FUNCTION TEST
DL/VA % PRED: 95 %
DL/VA: 4.35 ml/min/mmHg/L
DLCO COR % PRED: 81 %
DLCO COR: 17.5 ml/min/mmHg
DLCO UNC % PRED: 85 %
DLCO unc: 18.35 ml/min/mmHg
FEF 25-75 PRE: 2.85 L/s
FEF 25-75 Post: 2.61 L/sec
FEF2575-%CHANGE-POST: -8 %
FEF2575-%PRED-POST: 130 %
FEF2575-%PRED-PRE: 142 %
FEV1-%CHANGE-POST: -1 %
FEV1-%Pred-Post: 97 %
FEV1-%Pred-Pre: 99 %
FEV1-Post: 2.17 L
FEV1-Pre: 2.21 L
FEV1FVC-%Change-Post: 2 %
FEV1FVC-%PRED-PRE: 109 %
FEV6-%CHANGE-POST: -4 %
FEV6-%PRED-POST: 89 %
FEV6-%Pred-Pre: 93 %
FEV6-Post: 2.51 L
FEV6-Pre: 2.62 L
FEV6FVC-%Pred-Post: 104 %
FEV6FVC-%Pred-Pre: 104 %
FVC-%Change-Post: -4 %
FVC-%Pred-Post: 86 %
FVC-%Pred-Pre: 89 %
FVC-Post: 2.51 L
FVC-Pre: 2.62 L
POST FEV1/FVC RATIO: 87 %
Post FEV6/FVC ratio: 100 %
Pre FEV1/FVC ratio: 84 %
Pre FEV6/FVC Ratio: 100 %
RV % pred: 81 %
RV: 1.63 L
TLC % pred: 89 %
TLC: 4.24 L

## 2015-11-29 LAB — COMPREHENSIVE METABOLIC PANEL
ALBUMIN: 3.9 g/dL (ref 3.5–5.2)
ALK PHOS: 70 U/L (ref 39–117)
ALT: 34 U/L (ref 0–35)
AST: 17 U/L (ref 0–37)
BILIRUBIN TOTAL: 0.3 mg/dL (ref 0.2–1.2)
BUN: 23 mg/dL (ref 6–23)
CO2: 31 mEq/L (ref 19–32)
CREATININE: 0.91 mg/dL (ref 0.40–1.20)
Calcium: 9.1 mg/dL (ref 8.4–10.5)
Chloride: 105 mEq/L (ref 96–112)
GFR: 65.92 mL/min (ref >60.00)
GLUCOSE: 95 mg/dL (ref 70–99)
Potassium: 3.9 mEq/L (ref 3.5–5.1)
Sodium: 140 mEq/L (ref 135–145)
Total Protein: 6.6 g/dL (ref 6.0–8.3)

## 2015-11-29 NOTE — Patient Instructions (Signed)
It was a pleasure taking care of you today!  Your cough is most likely multifactorial. Likely related to the following: A. Upper Airway Cough Syndrome  Cont Flonase 2 squirts per nostril at HS  Cont Singulair 10 mg/tab, 1 tab at HS   B. GERD  Cont Protonix in am, Zantac at bedtime  Advised on dietary changes. Avoid food that will make your reflux worse.   Try to keep head of bed 30 degrees elevated.  Wait 2 hours at least after your last meal before you lie down   We will get a chest CT scan as well as a neck CT scan. We will get blood work today.  Return to clinic in 2 months

## 2015-11-29 NOTE — Assessment & Plan Note (Signed)
Weight reduction 

## 2015-11-29 NOTE — Assessment & Plan Note (Signed)
Recent SOB likely 2/2 obesity. Chest x-ray and PFT unremarkable. Observe. 

## 2015-11-29 NOTE — Assessment & Plan Note (Addendum)
Pt is being seen for cough. Cough since 2009, not affected by environment. No meds implicated. [place]oved from [place], moved houses, cough still the same. Was in  A lot of stress last yr > cough the same.  Has occasional sinus issues/throat clearing.  Drinking carbonated drinks makes her cough better.  CXR 07/20/15 No abn FENO 08/24/15 = 13 PFT 11/29/15 No obstruction. No significant bronchodilator response. Normal diffusion capacity  Cough, etiology: Likely still multifactorial 1. UACS. Still had frequent throat clearing during interview. Improved with PO prednisone. Not better with MDIs. MRI of the brain (10/2015) with some sinusitis. Cont flonase 2 sq at hs. cont singulair 10 mg hs. ENT saw the pt before I did.  I am not sure if she has neck issues/LAD causing reflex cough. Plan for chest ct scan with neck ct scan to R/O obstuction or LAD. Check respiratory allergy panel which can potentially trigger cough.  Hold off on Symbicort or other MDIs pending tests.   2. GERD. Cont PPI in am and Zantac at HS.   3. Less likely to have hyperreactive airway disease. She improved with prednisone but felt nauseated with Symbicort. PFT (11/2015) N. Holding off on further meds pending workup.  4. Patient is scheduled to see a neurologist. In the past, she would have headaches with her cough.

## 2015-11-29 NOTE — Progress Notes (Signed)
Subjective:    Patient ID: Debbie Schultz, female    DOB: 02-16-1951, 65 y.o.   MRN: [id number]  HPI   This is the case of Debbie Schultz, 65 y.o. Female, who was referred by Dr. Christia Reading in consultation regarding chronic cough.   As you very well know, patient has had the cough since 2009.  She was fine until cough slowly crept up on her. Over all, cough has been the same since 2009. What's different is the cough the last 1-1.5 yrs sometimes causes her to have HA which gets better when she lays down.  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 Last 1.5 yrs, cough causes her to have HA. Lying down helps with HA.  She does not know exact cause or trigger for cough. Sometimes associated with nasal congestion/drip, but not all the time.  Not necessarily worse cough at night. She was treated with flonase for yrs -- not really sure if cough got better. Was also treated for GERD with PPI for yrs -- not really better.   She left [facility] in Oct 2016 and drove cross country, going to [place] and [place] then finally making it here in [place] in Dec 2016. She has had cats forever, even before the cough.  She is now staying with her sister. House is old, relatively clean house, has carpets. (-) Molds.  Cough is NOT worse here compared to [facility], not worse at her current house compared to house in [facility].   Saw ENT and was given neurontin  100 mg TID.  Neurontin mad her really sleepy.   She was in a lot of stress at work last year but it is better now.  Cough was not worse with stress.   Non smoker. Denies asthma or copd.   ROV 11/29/2015  Patient returns to the office as follow-up on her cough. Since seeing her in May, she has seen Dr. Sherene Sires a couple of times. Her Nitric oxide test was negative. She was placed on prednisone taper which made the cough go away.  She was tried on Symbicort which did not help the cough. Symbicort made her nauseated. She Is back with her  baseline cough although HA is gone.    Review of Systems  Constitutional: Negative.  Negative for fever and unexpected weight change.       Gained weight x 20 lbs x 1.5 yrs.   HENT: Negative for congestion, dental problem, ear pain, nosebleeds, postnasal drip, rhinorrhea, sinus pressure, sneezing, sore throat and trouble swallowing.   Eyes: Negative.  Negative for redness and itching.  Respiratory: Positive for cough. Negative for chest tightness, shortness of breath and wheezing.   Cardiovascular: Negative.  Negative for palpitations and leg swelling.  Gastrointestinal: Negative.  Negative for nausea and vomiting.  Endocrine: Negative.   Genitourinary: Negative.  Negative for dysuria.  Musculoskeletal: Positive for arthralgias and joint swelling.  Skin: Negative.  Negative for rash.  Allergic/Immunologic: Negative.   Hematological: Negative.  Does not bruise/bleed easily.  Psychiatric/Behavioral: Negative.  Negative for dysphoric mood. The patient is not nervous/anxious.   All other systems reviewed and are negative.  No past medical history on file.   No family history on file.   No past surgical history on file.  Social History   Social History  . Marital status: Unknown    Spouse name: N/A  . Number of children: N/A  . Years of education: N/A   Occupational History  .  Not on file.   Social History Main Topics  . Smoking status: Never Smoker  . Smokeless tobacco: Not on file  . Alcohol use Not on file  . Drug use: Unknown  . Sexual activity: Not on file   Other Topics Concern  . Not on file   Social History Narrative  . No narrative on file     Allergies  Allergen Reactions  . Symbicort [Budesonide-Formoterol Fumarate] Nausea Only  . Bupropion Rash  . Sulfamethoxazole Rash     Outpatient Medications Prior to Visit  Medication Sig Dispense Refill  . busPIRone (BUSPAR) 5 MG tablet Take 1 tablet by mouth 2 (two) times daily.  0  . fluticasone (FLONASE) 50  MCG/ACT nasal spray Place 2 sprays into both nostrils daily. 16 g 5  . LIOTHYRONINE SODIUM PO Take 1 tablet by mouth 2 (two) times daily.    . montelukast (SINGULAIR) 10 MG tablet Take 1 tablet (10 mg total) by mouth at bedtime. 30 tablet 5  . Multiple Vitamin (MULTIVITAMIN) capsule Take 1 capsule by mouth daily.    . pantoprazole (PROTONIX) 40 MG tablet Take 1 tablet (40 mg total) by mouth daily. Take 30-60 min before first meal of the day 30 tablet 2  . PROPRANOLOL HCL PO Take 10 mg by mouth 3 (three) times daily.    . ranitidine (ZANTAC) 150 MG tablet Take 150 mg by mouth at bedtime.    . temazepam (RESTORIL) 15 MG capsule Take 15 mg by mouth at bedtime.  0  . traZODone (DESYREL) 100 MG tablet Take 300 mg by mouth at bedtime.  0  . venlafaxine XR (EFFEXOR-XR) 150 MG 24 hr capsule Take 450 mg by mouth at bedtime.  0  . Zinc Sulfate (ZINC 15 PO) Take by mouth.    . predniSONE (DELTASONE) 10 MG tablet Take  4 each am x 2 days,   2 each am x 2 days,  1 each am x 2 days and stop 14 tablet 0   No facility-administered medications prior to visit.    Meds ordered this encounter  Medications  . indomethacin (INDOCIN SR) 75 MG CR capsule    Sig: Take 2 capsules by mouth daily.    Refill:  0         Objective:   Physical Exam  Vitals:  Vitals:   11/29/15 1346  BP: 122/72  Pulse: 90  SpO2: 95%  Weight: 171 lb (77.6 kg)  Height: 5\' 2"  (1.575 m)    Constitutional/General:  Pleasant, well-nourished, well-developed, not in any distress,  Comfortably seating.  Well kempt  Body mass index is 31.28 kg/m. Wt Readings from Last 3 Encounters:  11/29/15 171 lb (77.6 kg)  09/29/15 160 lb (72.6 kg)  09/14/15 172 lb 9.6 oz (78.3 kg)    HEENT: Pupils equal and reactive to light and accommodation. Anicteric sclerae. Normal nasal mucosa.   No oral  lesions,  mouth clear,  oropharynx clear, no postnasal drip. (-) Oral thrush. No dental caries.  Airway - Mallampati class III  Neck: No  masses. Midline trachea. No JVD, (-) LAD. (-) bruits appreciated.  Respiratory/Chest: Grossly normal chest. (-) deformity. (-) Accessory muscle use.  Symmetric expansion. (-) Tenderness on palpation.  Resonant on percussion.  Diminished BS on both lower lung zones. (-) wheezing, crackles, rhonchi (-) egophony  Cardiovascular: Regular rate and  rhythm, heart sounds normal, no murmur or gallops, no peripheral edema  Gastrointestinal:  Normal bowel sounds. Soft, non-tender. No hepatosplenomegaly.  (-)  masses.   Musculoskeletal:  Normal muscle tone. Normal gait.   Extremities: Grossly normal. (-) clubbing, cyanosis.  (-) edema  Skin: (-) rash,lesions seen.   Neurological/Psychiatric : alert, oriented to time, place, person. Normal mood and affect            Assessment & Plan:  Cough Pt is being seen for cough. Cough since 2009, not affected by environment. No meds implicated. [place], [place] houses, cough still the same. Was in  A lot of stress last yr > cough the same.  Has occasional sinus issues/throat clearing.  Drinking carbonated drinks makes her cough better.  CXR 07/20/15 No abn FENO 08/24/15 = 13 PFT 11/29/15 No obstruction. No significant bronchodilator response. Normal diffusion capacity  Cough, etiology: Likely still multifactorial 1. UACS. Still had frequent throat clearing during interview. Improved with PO prednisone. Not better with MDIs. MRI of the brain (10/2015) with some sinusitis. Cont flonase 2 sq at hs. cont singulair 10 mg hs. ENT saw the pt before I did.  I am not sure if she has neck issues/LAD causing reflex cough. Plan for chest ct scan with neck ct scan to R/O obstuction or LAD. Check respiratory allergy panel which can potentially trigger cough.  Hold off on Symbicort or other MDIs pending tests.   2. GERD. Cont PPI in am and Zantac at HS.   3. Less likely to have hyperreactive airway disease. She improved with prednisone but felt  nauseated with Symbicort. PFT (11/2015) N. Holding off on further meds pending workup.  4. Patient is scheduled to see a neurologist. In the past, she would have headaches with her cough.    Exertional dyspnea Recent SOB likely 2/2 obesity. Chest x-ray and PFT unremarkable. Observe.  Obesity Weight reduction    Patient will follow up with me in 3-4 mos.     Pollie MeyerJ. Angelo A. de Dios, MD 11/29/2015   5:41 PM Pulmonary and Critical Care Medicine [facility] Pager: [phone] Office: [phone], Fax: [phone]

## 2015-11-30 LAB — RESPIRATORY ALLERGY PROFILE REGION II ~~LOC~~
ALLERGEN, COMM SILVER BIRCH, T3: 0.37 kU/L — AB
ALLERGEN, COTTONWOOD, T14: 0.23 kU/L — AB
Allergen, A. alternata, m6: <0.1 kU/L
Allergen, C. Herbarum, M2: <0.1 kU/L
Allergen, D pternoyssinus,d7: <0.1 kU/L
Allergen, Oak,t7: 0.42 kU/L — ABNORMAL HIGH
Aspergillus fumigatus, m3: <0.1 kU/L
BERMUDA GRASS: 0.37 kU/L — AB
BOX ELDER: 0.37 kU/L — AB
COMMON RAGWEED: 0.35 kU/L — AB
Cat Dander: <0.1 kU/L
D. farinae: <0.1 kU/L
Dog Dander: <0.1 kU/L
ELM IGE: 0.5 kU/L — AB
IGE (IMMUNOGLOBULIN E), SERUM: 34 kU/L (ref <115)
Johnson Grass: <0.1 kU/L
Pecan/Hickory Tree IgE: 1.59 kU/L — ABNORMAL HIGH
Rough Pigweed  IgE: 0.4 kU/L — ABNORMAL HIGH
SHEEP SORREL IGE: 0.36 kU/L — AB
Timothy Grass: <0.1 kU/L

## 2015-12-04 ENCOUNTER — Other Ambulatory Visit: Payer: Self-pay | Admitting: Pulmonary Disease

## 2015-12-04 DIAGNOSIS — J302 Other seasonal allergic rhinitis: Secondary | ICD-10-CM

## 2015-12-05 ENCOUNTER — Ambulatory Visit (INDEPENDENT_AMBULATORY_CARE_PROVIDER_SITE_OTHER)
Admission: RE | Admit: 2015-12-05 | Discharge: 2015-12-05 | Disposition: A | Discharge status: Home / Self Care | Payer: Medicare Other | Source: Ambulatory Visit | Attending: Pulmonary Disease | Admitting: Pulmonary Disease

## 2015-12-05 DIAGNOSIS — R0600 Dyspnea, unspecified: Secondary | ICD-10-CM | POA: Diagnosis not present

## 2015-12-19 ENCOUNTER — Other Ambulatory Visit: Payer: Self-pay | Admitting: Pulmonary Disease

## 2015-12-19 DIAGNOSIS — R911 Solitary pulmonary nodule: Secondary | ICD-10-CM

## 2016-01-03 ENCOUNTER — Other Ambulatory Visit: Payer: Self-pay | Admitting: Internal Medicine

## 2016-01-10 ENCOUNTER — Ambulatory Visit (INDEPENDENT_AMBULATORY_CARE_PROVIDER_SITE_OTHER): Payer: Medicare Other | Admitting: Neurology

## 2016-01-10 ENCOUNTER — Encounter: Payer: Self-pay | Admitting: Neurology

## 2016-01-10 VITALS — BP 123/76 | HR 92 | Ht 62.0 in | Wt 170.6 lb

## 2016-01-10 DIAGNOSIS — G4453 Primary thunderclap headache: Secondary | ICD-10-CM | POA: Diagnosis not present

## 2016-01-10 DIAGNOSIS — G4483 Primary cough headache: Secondary | ICD-10-CM | POA: Diagnosis not present

## 2016-01-10 MED ORDER — TOPIRAMATE 25 MG PO TABS: 50.0000 mg | ORAL_TABLET | Freq: Every day | ORAL | 6 refills | Status: DC

## 2016-01-10 NOTE — Progress Notes (Signed)
[facility]    Provider:  Dr Lucia Gaskins Referring Provider: [name], NP Primary Care Physician:  [name], NP  CC:  Headache with cough  HPI:  Debbie Schultz is a 65 y.o. female here as a referral from Dr. Manson Passey for headache with cough. Past medical history of hypertension, depression, insomnia, hypothyroidism, cough, osteopenia. She has had this cough has been chronic for over 10 years, headache started 2 years ago without inciting event or head trauma but she was under a lot of stress. Headache started during a very stressful situation while working, she fainted, she doesn't remember hitting her head but she lost consciousness and she was very dizzy when she "came to" with nausea, an ambulance was called, she vomited, it started after that. She continued to have a lot of stress and another episode of panic attack after that and the headaches started at that time with cough. She retired and moved here. Whenever she coughs she has pain explosion in the frontal areas, lasts up to 30 minutes, getting less severe over time, headaches have mostly gone away. The headaches are several times a day, they can still be severe, worse later in the day, sneezing is the worst.  No headache with exertion only with cough/valsalva.   Reviewed notes, labs and imaging from outside physicians, which showed:    She is tried indomethacin, she is on naproxen and propranolol. Past medical history of depression, chronic cough, headaches. Patient has headache with cough, sneeze or any increase in intrathoracic pressure. She was started on indomethacin, which did not help and not covered by insurance. Symptoms have been ongoing for years, headache occurs if she coughs, sneezes or strains with BM, lasting several minutes to an hour. No associated photophobia, phonophobia, dizziness, changes in vision or hearing, paresthesias, weakness of arms or legs, or nausea. She exercises regularly and cough  does not occur with exercise. Chronic cough and follows with pulmonology, improved with prednisone taper the headache is stabbing, frontal.   BUN 23, creatinine 0.91 John 11/29/2015, TSH 2.93 06/02/2015  Personally reviewed MRI images of the brain August 2017 and agree with the following:  FINDINGS: There is no evidence of acute infarct, intracranial hemorrhage, mass, midline shift, or extra-axial fluid collection. The ventricles and sulci are normal. Scattered punctate foci of T2 hyperintensity in the cerebral white matter are nonspecific and not greater than expected for patient's age.  Prior bilateral cataract extraction is noted. There is mild right maxillary sinus mucosal thickening. Trace bilateral mastoid effusions are present. Major intracranial vascular flow voids are preserved. Mild upper cervical spondylosis is partially visualized.  IMPRESSION: Unremarkable appearance of the brain for age.   Review of Systems: Patient complains of symptoms per HPI as well as the following symptoms: headache, insomnia, tremor, depression, anxiety, incontinence. Pertinent negatives per HPI. All others negative.   Social History   Social History  . Marital status: Unknown    Spouse name: N/A  . Number of children: 0  . Years of education: Masters   Occupational History  . Retired    Social History Main Topics  . Smoking status: Never Smoker  . Smokeless tobacco: Never Used  . Alcohol use 0.0 oz/week     Comment: [place]  . Drug use: No  . Sexual activity: Not on file   Other Topics Concern  . Not on file   Social History Narrative   Lives with mother.   Caffeine use: 1 cup coffee every morning   Tea  rare    Family History  Problem Relation Age of Onset  . Headache Neg Hx     Past Medical History:  Diagnosis Date  . Depression     Past Surgical History:  Procedure Laterality Date  . REPLACEMENT TOTAL KNEE Right 2008  . REPLACEMENT TOTAL KNEE Left 2012    Current  Outpatient Prescriptions  Medication Sig Dispense Refill  . ARIPiprazole (ABILIFY) 5 MG tablet Take 5 mg by mouth every morning.  0  . budesonide-formoterol (SYMBICORT) 80-4.5 MCG/ACT inhaler Inhale 2 puffs into the lungs 2 (two) times daily.    . busPIRone (BUSPAR) 5 MG tablet Take 1 tablet by mouth 2 (two) times daily.  0  . fluticasone (FLONASE) 50 MCG/ACT nasal spray Place 2 sprays into both nostrils daily. 16 g 5  . LIOTHYRONINE SODIUM PO Take 1 tablet by mouth 2 (two) times daily.    . montelukast (SINGULAIR) 10 MG tablet Take 1 tablet (10 mg total) by mouth at bedtime. 30 tablet 5  . Multiple Vitamin (MULTIVITAMIN) capsule Take 1 capsule by mouth daily.    . naproxen (NAPROSYN) 250 MG tablet Take 250 mg by mouth 2 (two) times daily.  1  . pantoprazole (PROTONIX) 40 MG tablet take 1 tablet by mouth once daily take 30 to 60 MINUTES BEFORE THE FIRST MEAL OF THE DAY 30 tablet 2  . PROPRANOLOL HCL PO Take 10 mg by mouth 3 (three) times daily.    . ranitidine (ZANTAC) 150 MG tablet Take 150 mg by mouth at bedtime.    . temazepam (RESTORIL) 15 MG capsule Take 15 mg by mouth at bedtime.  0  . traZODone (DESYREL) 100 MG tablet Take 300 mg by mouth at bedtime.  0  . venlafaxine XR (EFFEXOR-XR) 150 MG 24 hr capsule Take 150 mg by mouth at bedtime.   0  . Zinc Sulfate (ZINC 15 PO) Take by mouth.    . topiramate (TOPAMAX) 25 MG tablet Take 2 tablets (50 mg total) by mouth at bedtime. Start with one pill at night beofre bed for one week then increase to two pills at bedtime 60 tablet 6   No current facility-administered medications for this visit.     Allergies as of 01/10/2016 - Review Complete 01/10/2016  Allergen Reaction Noted  . Symbicort [budesonide-formoterol fumarate] Nausea Only 09/22/2015  . Bupropion Rash 07/20/2015  . Sulfamethoxazole Rash 07/20/2015    Vitals: BP 123/76 (BP Location: Right Arm, Patient Position: Sitting, Cuff Size: Normal)   Pulse 92   Ht 5\' 2"  (1.575 m)   Wt  170 lb 9.6 oz (77.4 kg)   BMI 31.20 kg/m  Last Weight:  Wt Readings from Last 1 Encounters:  01/10/16 170 lb 9.6 oz (77.4 kg)   Last Height:   Ht Readings from Last 1 Encounters:  01/10/16 5\' 2"  (1.575 m)    Physical exam: Exam: Gen: NAD, conversant, well nourised, obese, well groomed                     CV: RRR, no MRG. No Carotid Bruits. No peripheral edema, warm, nontender Eyes: Conjunctivae clear without exudates or hemorrhage  Neuro: Detailed Neurologic Exam  Speech:    Speech is normal; fluent and spontaneous with normal comprehension.  Cognition:    The patient is oriented to person, place, and time;     recent and remote memory intact;     language fluent;     normal attention, concentration,  fund of knowledge Cranial Nerves:    The pupils are equal, round, and reactive to light. The fundi are normal and spontaneous venous pulsations are present. Visual fields are full to finger confrontation. Extraocular movements are intact. Trigeminal sensation is intact and the muscles of mastication are normal. The face is symmetric. The palate elevates in the midline. Hearing intact. Voice is normal. Shoulder shrug is normal. The tongue has normal motion without fasciculations.   Coordination:    Normal finger to nose and heel to shin. Normal rapid alternating movements.   Gait:    Heel-toe and tandem gait are normal.   Motor Observation:    No asymmetry, no atrophy, and no involuntary movements noted. Tone:    Normal muscle tone.    Posture:    Posture is normal. normal erect    Strength:    Strength is V/V in the upper and lower limbs.      Sensation: intact to LT     Reflex Exam:  DTR's:    Deep tendon reflexes in the upper and lower extremities are normal bilaterally.   Toes:    The toes are downgoing bilaterally.   Clonus:    Clonus is absent.      Assessment/Plan:  Patient here with explosive severe thunderclap headache with cough/sneeze/valsalva.  MRI of the brain unremarkable.  Also need to rule out unruptured aneurysm or carotid stenosis/blockage with MRA of the head and neck to rul eout other causes. If MRAs negative then this is likely primary cough/valsalva headache. She has tried multiple medications(primary care with an excellent job of trying indomethacin, naproxen, propranolol). Will trial Topiramate which also used in this condition.    Remember to drink plenty of fluid, eat healthy meals and do not skip any meals. Try to eat protein with a every meal and eat a healthy snack such as fruit or nuts in between meals. Try to keep a regular sleep-wake schedule and try to exercise daily, particularly in the form of walking, 20-30 minutes a day, if you can.   As far as your medications are concerned, I would like to suggest: topiramate, start with 25mg (1 pill) at night for one week and then increase to 50mg  at night (2 pills).  As far as diagnostic testing: Imaging of the blood vessels of the head/neck, lab  I would like to see you back in 4 months, sooner if we need to. Please call us with any interim questions, concerns, problems, updates or refill requests.   Discussed: To prevent or relieve headaches, try the following: Cool Compress. Lie down and place a cool compress on your head.  Avoid headache triggers. If certain foods or odors seem to have triggered your migraines in the past, avoid them. A headache diary might help you identify triggers.  Include physical activity in your daily routine. Try a daily walk or other moderate aerobic exercise.  Manage stress. Find healthy ways to cope with the stressors, such as delegating tasks on your to-do list.  Practice relaxation techniques. Try deep breathing, yoga, massage and visualization.  Eat regularly. Eating regularly scheduled meals and maintaining a healthy diet might help prevent headaches. Also, drink plenty of fluids.  Follow a regular sleep schedule. Sleep deprivation might  contribute to headaches Consider biofeedback. With this mind-body technique, you learn to control certain bodily functions - such as muscle tension, heart rate and blood pressure - to prevent headaches or reduce headache pain.    Proceed to emergency room if you  experience new or worsening symptoms or symptoms do not resolve, if you have new neurologic symptoms or if headache is severe, or for any concerning symptom.   Cc: Gayla DossJennifer Brown    Kollin Udell, MD  [facility] [address] [place], [place] [id number]  Phone [phone] Fax [phone]

## 2016-01-10 NOTE — Patient Instructions (Addendum)
Remember to drink plenty of fluid, eat healthy meals and do not skip any meals. Try to eat protein with a every meal and eat a healthy snack such as fruit or nuts in between meals. Try to keep a regular sleep-wake schedule and try to exercise daily, particularly in the form of walking, 20-30 minutes a day, if you can.   As far as your medications are concerned, I would like to suggest: topiramate, start with 25mg (1 pill) at night for one week and then increase to 50mg  at night (2 pills).  As far as diagnostic testing: Imaging of the blood vessels of the head/neck, lab  I would like to see you back in 4 months, sooner if we need to. Please call us with any interim questions, concerns, problems, updates or refill requests.   Our phone number is [phone]. We also have an after hours call service for urgent matters and there is a physician on-call for urgent questions. For any emergencies you know to call 911 or go to the nearest emergency room  Topiramate tablets What is this medicine? TOPIRAMATE (toe PYRE a mate) is used to treat seizures in adults or children with epilepsy. It is also used for the prevention of migraine headaches. This medicine may be used for other purposes; ask your health care provider or pharmacist if you have questions. What should I tell my health care provider before I take this medicine? They need to know if you have any of these conditions: -bleeding disorders -cirrhosis of the liver or liver disease -diarrhea -glaucoma -kidney stones or kidney disease -low blood counts, like low white cell, platelet, or red cell counts -lung disease like asthma, obstructive pulmonary disease, emphysema -metabolic acidosis -on a ketogenic diet -schedule for surgery or a procedure -suicidal thoughts, plans, or attempt; a previous suicide attempt by you or a family member -an unusual or allergic reaction to topiramate, other medicines, foods, dyes, or preservatives -pregnant or  trying to get pregnant -breast-feeding How should I use this medicine? Take this medicine by mouth with a glass of water. Follow the directions on the prescription label. Do not crush or chew. You may take this medicine with meals. Take your medicine at regular intervals. Do not take it more often than directed. Talk to your pediatrician regarding the use of this medicine in children. Special care may be needed. While this drug may be prescribed for children as young as 562 years of age for selected conditions, precautions do apply. Overdosage: If you think you have taken too much of this medicine contact a poison control center or emergency room at once. NOTE: This medicine is only for you. Do not share this medicine with others. What if I miss a dose? If you miss a dose, take it as soon as you can. If your next dose is to be taken in less than 6 hours, then do not take the missed dose. Take the next dose at your regular time. Do not take double or extra doses. What may interact with this medicine? Do not take this medicine with any of the following medications: -probenecid This medicine may also interact with the following medications: -acetazolamide -alcohol -amitriptyline -aspirin and aspirin-like medicines -birth control pills -certain medicines for depression -certain medicines for seizures -certain medicines that treat or prevent blood clots like warfarin, enoxaparin, dalteparin, apixaban, dabigatran, and rivaroxaban -digoxin -hydrochlorothiazide -lithium -medicines for pain, sleep, or muscle relaxation -metformin -methazolamide -NSAIDS, medicines for pain and inflammation, like ibuprofen or naproxen -pioglitazone -  risperidone This list may not describe all possible interactions. Give your health care provider a list of all the medicines, herbs, non-prescription drugs, or dietary supplements you use. Also tell them if you smoke, drink alcohol, or use illegal drugs. Some items may  interact with your medicine. What should I watch for while using this medicine? Visit your doctor or health care professional for regular checks on your progress. Do not stop taking this medicine suddenly. This increases the risk of seizures if you are using this medicine to control epilepsy. Wear a medical identification bracelet or chain to say you have epilepsy or seizures, and carry a card that lists all your medicines. This medicine can decrease sweating and increase your body temperature. Watch for signs of deceased sweating or fever, especially in children. Avoid extreme heat, hot baths, and saunas. Be careful about exercising, especially in hot weather. Contact your health care provider right away if you notice a fever or decrease in sweating. You should drink plenty of fluids while taking this medicine. If you have had kidney stones in the past, this will help to reduce your chances of forming kidney stones. If you have stomach pain, with nausea or vomiting and yellowing of your eyes or skin, call your doctor immediately. You may get drowsy, dizzy, or have blurred vision. Do not drive, use machinery, or do anything that needs mental alertness until you know how this medicine affects you. To reduce dizziness, do not sit or stand up quickly, especially if you are an older patient. Alcohol can increase drowsiness and dizziness. Avoid alcoholic drinks. If you notice blurred vision, eye pain, or other eye problems, seek medical attention at once for an eye exam. The use of this medicine may increase the chance of suicidal thoughts or actions. Pay special attention to how you are responding while on this medicine. Any worsening of mood, or thoughts of suicide or dying should be reported to your health care professional right away. This medicine may increase the chance of developing metabolic acidosis. If left untreated, this can cause kidney stones, bone disease, or slowed growth in children. Symptoms  include breathing fast, fatigue, loss of appetite, irregular heartbeat, or loss of consciousness. Call your doctor immediately if you experience any of these side effects. Also, tell your doctor about any surgery you plan on having while taking this medicine since this may increase your risk for metabolic acidosis. Birth control pills may not work properly while you are taking this medicine. Talk to your doctor about using an extra method of birth control. Women who become pregnant while using this medicine may enroll in the [place] American Antiepileptic Drug Pregnancy Registry by calling [phone]. This registry collects information about the safety of antiepileptic drug use during pregnancy. What side effects may I notice from receiving this medicine? Side effects that you should report to your doctor or health care professional as soon as possible: -allergic reactions like skin rash, itching or hives, swelling of the face, lips, or tongue -decreased sweating and/or rise in body temperature -depression -difficulty breathing, fast or irregular breathing patterns -difficulty speaking -difficulty walking or controlling muscle movements -hearing impairment -redness, blistering, peeling or loosening of the skin, including inside the mouth -tingling, pain or numbness in the hands or feet -unusual bleeding or bruising -unusually weak or tired -worsening of mood, thoughts or actions of suicide or dying Side effects that usually do not require medical attention (report to your doctor or health care professional if they continue or  are bothersome): -altered taste -back pain, joint or muscle aches and pains -diarrhea, or constipation -headache -loss of appetite -nausea -stomach upset, indigestion -tremors This list may not describe all possible side effects. Call your doctor for medical advice about side effects. You may report side effects to FDA at 1-800-FDA-1088. Where should I keep my  medicine? Keep out of the reach of children. Store at room temperature between 15 and 30 degrees C (59 and 86 degrees F) in a tightly closed container. Protect from moisture. Throw away any unused medicine after the expiration date. NOTE: This sheet is a summary. It may not cover all possible information. If you have questions about this medicine, talk to your doctor, pharmacist, or health care provider.    2016, [employer]vier/[employer] Standard. (2013-03-01 23:17:57)

## 2016-01-11 ENCOUNTER — Encounter: Payer: Self-pay | Admitting: Neurology

## 2016-01-16 ENCOUNTER — Ambulatory Visit (INDEPENDENT_AMBULATORY_CARE_PROVIDER_SITE_OTHER): Payer: Medicare Other | Admitting: Allergy and Immunology

## 2016-01-16 ENCOUNTER — Encounter (INDEPENDENT_AMBULATORY_CARE_PROVIDER_SITE_OTHER): Payer: Self-pay

## 2016-01-16 ENCOUNTER — Encounter: Payer: Self-pay | Admitting: Allergy and Immunology

## 2016-01-16 VITALS — BP 118/72 | HR 76 | Temp 98.2°F | Resp 16 | Ht 61.85 in | Wt 171.4 lb

## 2016-01-16 DIAGNOSIS — K219 Gastro-esophageal reflux disease without esophagitis: Secondary | ICD-10-CM

## 2016-01-16 DIAGNOSIS — J3089 Other allergic rhinitis: Secondary | ICD-10-CM | POA: Diagnosis not present

## 2016-01-16 MED ORDER — RANITIDINE HCL 300 MG PO TABS: 300.0000 mg | ORAL_TABLET | Freq: Every day | ORAL | 5 refills | Status: DC

## 2016-01-16 MED ORDER — PANTOPRAZOLE SODIUM 40 MG PO TBEC: 40.0000 mg | DELAYED_RELEASE_TABLET | Freq: Two times a day (BID) | ORAL | 5 refills | Status: DC

## 2016-01-16 MED ORDER — BECLOMETHASONE DIPROPIONATE 80 MCG/ACT NA AERS: INHALATION_SPRAY | NASAL | 5 refills | Status: DC

## 2016-01-16 NOTE — Patient Instructions (Addendum)
  1. Allergen avoidance measures  2. Treat and prevent inflammation:   A. Qnasl 80 - 2 puffs each nostril one time per day  B. montelukast 10 mg - one tablet one time per day  3. Treat and prevent reflux:   A. slowly taper off all forms of caffeine and chocolate  B. Protonix 40 mg tablet twice a day  C. ranitidine 300 mg tablet in the evening  4. Can add OTC Mucinex DM 2 tablets twice a day if needed  5. Return to clinic in 4 weeks or earlier if problem

## 2016-01-16 NOTE — Progress Notes (Signed)
Dear Debbie LucksJennifer Schultz,  Thank you for referring Debbie FlakeKathryn Holdman to the [facility] [place] [place] on 01/16/2016.   Below is a summation of this patient's evaluation and recommendations.  Thank you for your referral. I will keep you informed about this patient's response to treatment.   If you have any questions please do not hesitate to contact me.   Sincerely,  Jessica PriestEric J. Charle Clear, MD  [facility]   ______________________________________________________________________    NEW PATIENT NOTE  Referring Provider: Boneta LucksBrown, Jennifer, NP Primary Provider: Boneta LucksJennifer Brown, NP Date of office visit: 01/16/2016    Subjective:   Chief Complaint:  Debbie Schultz (DOB: 01/06/1951) is a 65 y.o. female who presents to the clinic on 01/16/2016 with a chief complaint of Cough .     HPI: Debbie Schultz presents to this clinic in evaluation of a systemic steroid responsive cough that has been in existence for greater than 10 years. Associated symptoms include throat clearing and a glob stuck in her throat. She coughs so hard she almost vomits. She has no associated chest tightness or shortness of breath. She has no significant nasal symptoms and does not have any anosmia or history of ugly nasal discharge. She does appear to have cough-induced headaches and she is under very evaluation and treatment with a neurologist for this issue.  There is no obvious provoking factor giving rise to her cough. Environmental changes do not appear to precipitate or help the issue with her cough.  She has had multiple evaluations with an ENT doctor and pulmonologist and has had multiple therapies administered including treatment directed at lower airway inflammation, upper airway inflammation, and reflux all of which has not resulted in a good response regarding her cough. The only therapy that results in a very good response regarding her cough is the  administration of systemic steroids.  Debbie Schultz consumes coffee daily, tea three times per week, and chocolate almost daily  Past Medical History:  Diagnosis Date  . Anxiety   . Depression   . Headache   . Hypothyroid   . Insomnia     Past Surgical History:  Procedure Laterality Date  . BLADDER SURGERY    . KNEE ARTHROSCOPY    . REPLACEMENT TOTAL KNEE Right 2008  . REPLACEMENT TOTAL KNEE Left 2012  . TONSILLECTOMY        Medication List      ARIPiprazole 5 MG tablet Commonly known as:  ABILIFY Take 5 mg by mouth every morning.   busPIRone 5 MG tablet Commonly known as:  BUSPAR Take 1 tablet by mouth 2 (two) times daily.   fluticasone 50 MCG/ACT nasal spray Commonly known as:  FLONASE Place 2 sprays into both nostrils daily.   LIOTHYRONINE SODIUM PO Take 1 tablet by mouth 2 (two) times daily.   montelukast 10 MG tablet Commonly known as:  SINGULAIR Take 1 tablet (10 mg total) by mouth at bedtime.   multivitamin capsule Take 1 capsule by mouth daily.   naproxen 250 MG tablet Commonly known as:  NAPROSYN Take 250 mg by mouth 2 (two) times daily.   pantoprazole 40 MG tablet Commonly known as:  PROTONIX take 1 tablet by mouth once daily take 30 to 60 MINUTES BEFORE THE FIRST MEAL OF THE DAY   ranitidine 150 MG tablet Commonly known as:  ZANTAC Take 150 mg by mouth at bedtime.   temazepam 15 MG capsule Commonly known as:  RESTORIL  Take 15 mg by mouth at bedtime.   topiramate 25 MG tablet Commonly known as:  TOPAMAX Take 2 tablets (50 mg total) by mouth at bedtime. Start with one pill at night beofre bed for one week then increase to two pills at bedtime   traZODone 100 MG tablet Commonly known as:  DESYREL Take 300 mg by mouth at bedtime.   venlafaxine XR 150 MG 24 hr capsule Commonly known as:  EFFEXOR-XR Take 150 mg by mouth at bedtime.       Allergies  Allergen Reactions  . Symbicort [Budesonide-Formoterol Fumarate] Nausea Only  .  Bupropion Rash  . Sulfamethoxazole Rash    Review of systems negative except as noted in HPI / PMHx or noted below:  Review of Systems  Constitutional: Negative.   HENT: Negative.   Eyes: Negative.   Respiratory: Negative.   Cardiovascular: Negative.   Gastrointestinal: Negative.   Genitourinary: Negative.   Musculoskeletal: Negative.   Skin: Negative.   Neurological: Negative.   Endo/Heme/Allergies: Negative.   Psychiatric/Behavioral: Negative.     Family History  Problem Relation Age of Onset  . Heart attack Father   . Diabetes Maternal Aunt   . Stroke Paternal Grandmother   . Headache Neg Hx     Social History   Social History  . Marital status: Unknown    Spouse name: N/A  . Number of children: 0  . Years of education: Masters   Occupational History  . Retired    Social History Main Topics  . Smoking status: Never Smoker  . Smokeless tobacco: Never Used  . Alcohol use 0.0 oz/week     Comment: [place]  . Drug use: No  . Sexual activity: Not on file   Other Topics Concern  . Not on file   Social History Narrative   Lives with mother.   Caffeine use: 1 cup coffee every morning   Tea rare    Environmental and Social history  Lives in a house with a dry environment, a cat located inside the household, carpeting in the bedroom, plastic on the bed but not the pillow, and no smoking ongoing with inside the household. 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  Slightly raspy voice  HENT:  Head: Normocephalic. Head is without right periorbital erythema and without left periorbital erythema.  Right Ear: External ear and ear canal normal.  Left Ear:  External ear and ear canal normal. Tympanic membrane is scarred.  Nose: Nose normal. No mucosal edema or rhinorrhea.  Mouth/Throat: Oropharynx is clear and moist and mucous membranes are normal. No oropharyngeal exudate.  Eyes: Conjunctivae and lids are normal. Pupils are equal, round, and reactive to light.  Neck: Trachea normal. No tracheal deviation present. No thyromegaly present.  Cardiovascular: Normal rate, regular rhythm, S1 normal, S2 normal and normal heart sounds.   No murmur heard. Pulmonary/Chest: Effort normal. No stridor. No tachypnea. No respiratory distress. She has no wheezes. She has no rales. She exhibits no tenderness.  Abdominal: Soft. She exhibits no distension and no mass. There is no hepatosplenomegaly. There is no tenderness. There is no rebound and no guarding.  Musculoskeletal:  She exhibits no edema or tenderness.  Lymphadenopathy:       Head (right side): No tonsillar adenopathy present.       Head (left side): No tonsillar adenopathy present.    She has no cervical adenopathy.    She has no axillary adenopathy.  Neurological: She is alert. Gait normal.  Skin: No rash noted. She is not diaphoretic. No erythema. No pallor. Nails show no clubbing.  Psychiatric: Mood and affect normal.    Diagnostics: Allergy skin tests were performed. She demonstrated hypersensitivity to various pollens including grasses, weeds, trees, and also demonstrated hypersensitivity to mold.  Spirometry was performed and demonstrated an FEV1 of 2.14 @ 97 % of predicted.  Results of an MRI of her brain completed 11/07/2015 identified the following:  There is no evidence of acute infarct, intracranial hemorrhage, mass, midline shift, or extra-axial fluid collection. The ventricles and sulci are normal. Scattered punctate foci of T2 hyperintensity in the cerebral white matter are nonspecific and not greater than expected for patient's age.  Prior bilateral cataract extraction is noted.  There is mild right maxillary sinus mucosal thickening. Trace bilateral mastoid effusions are present. Major intracranial vascular flow voids are preserved. Mild upper cervical spondylosis is partially visualized  Results of a chest CT scan without contrast completed 12/05/2015 identified the following:  1. No acute cardiopulmonary abnormalities. 2. Mild bronchial wall thickening. 3. Right upper lobe pulmonary nodule measures 2 mm.   Assessment and Plan:    1. LPRD (laryngopharyngeal reflux disease)   2. Other allergic rhinitis     1. Allergen avoidance measures  2. Treat and prevent inflammation:   A. Qnasl 80 - 2 puffs each nostril one time per day  B. montelukast 10 mg - one tablet one time per day  3. Treat and prevent reflux:   A. slowly taper off all forms of caffeine and chocolate  B. Protonix 40 mg tablet twice a day  C. ranitidine 300 mg tablet in the evening  4. Can add OTC Mucinex DM 2 tablets twice a day if needed  5. Return to clinic in 4 weeks or earlier if problem  Samara DeistKathryn most likely has an inflamed and irritated respiratory tract secondary to her reflux but there may also be a contribution from some of her atopic disease. I'm going to have her focus on very aggressive therapy directed against reflux which would mean that she have to come off all her forms of caffeine and chocolate which I think is going to be a very difficult endeavor for her to perform. As well, we'll increase her dose of proton pump inhibitor and an H2 receptor blocker. She will use anti-inflammatory medications for her upper respiratory tract disease and we will withhold the use of any inhaled steroids at this point in time assuming that her cough is a manifestation of reflux and upper airway inflammation at this point. I will regroup with her in approximately 4 weeks or earlier if there is a problem.  Jessica PriestEric J. Trinnity Breunig, MD [facility]

## 2016-01-18 ENCOUNTER — Encounter: Payer: Self-pay | Admitting: *Deleted

## 2016-01-27 ENCOUNTER — Other Ambulatory Visit: Payer: Self-pay | Admitting: Allergy and Immunology

## 2016-02-06 ENCOUNTER — Ambulatory Visit (INDEPENDENT_AMBULATORY_CARE_PROVIDER_SITE_OTHER): Payer: Medicare Other | Admitting: Pulmonary Disease

## 2016-02-06 ENCOUNTER — Encounter: Payer: Self-pay | Admitting: Pulmonary Disease

## 2016-02-06 DIAGNOSIS — R0609 Other forms of dyspnea: Secondary | ICD-10-CM

## 2016-02-06 DIAGNOSIS — R05 Cough: Secondary | ICD-10-CM | POA: Diagnosis not present

## 2016-02-06 DIAGNOSIS — R0600 Dyspnea, unspecified: Secondary | ICD-10-CM

## 2016-02-06 DIAGNOSIS — E669 Obesity, unspecified: Secondary | ICD-10-CM | POA: Diagnosis not present

## 2016-02-06 DIAGNOSIS — R059 Cough, unspecified: Secondary | ICD-10-CM

## 2016-02-06 NOTE — Progress Notes (Signed)
Subjective:    Patient ID: Debbie Schultz, female    DOB: 09/26/1950, 65 y.o.   MRN: [id number]  HPI   This is the case of Debbie Schultz, 65 y.o. Female, who was referred by Dr. Christia Readingwight Bates in consultation regarding chronic cough.   As you very well know, patient has had the cough since 2009.  She was fine until cough slowly crept up on her. Over all, cough has been the same since 2009. What's different is the cough the last 1-1.5 yrs sometimes causes her to have HA which gets better when she lays down.  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 Last 1.5 yrs, cough causes her to have HA. Lying down helps with HA.  She does not know exact cause or trigger for cough. Sometimes associated with nasal congestion/drip, but not all the time.  Not necessarily worse cough at night. She was treated with flonase for yrs -- not really sure if cough got better. Was also treated for GERD with PPI for yrs -- not really better.   [facility] [place] in [place] and drove cross country, going [place] then [place] making it here in [place] in Dec 2016. She has had cats forever, even before the cough.  She is now staying with her sister. House is old, relatively clean house, has carpets. (-) Molds.  Cough is NOT worse here [facility] to [facility], not worse at her current house compared to [place] in [facility] [place].   Saw ENT and was given neurontin  100 mg TID.  Neurontin mad her really sleepy.   She was in a lot of stress at work last year but it is better now.  Cough was not worse with stress.   Non smoker. Denies asthma or copd.   ROV 11/29/2015  Patient returns to the office as follow-up on her cough. Since seeing her in May, she has seen Dr. Sherene SiresWert a couple of times. Her Nitric oxide test was negative. She was placed on prednisone taper which made the cough go away.  She was tried on Symbicort which did not help the cough. Symbicort made her nauseated. She Is back with her  baseline cough although HA is gone.   ROV  02/05/16  Pt is back for f/u on her cough.  Saw Allergy MD and was told was (-) for allergies. Neuro is sein her. No f/u with GI.    Review of Systems  Constitutional: Negative.  Negative for fever and unexpected weight change.       Gained weight x 20 lbs x 1.5 yrs.   HENT: Negative for congestion, dental problem, ear pain, nosebleeds, postnasal drip, rhinorrhea, sinus pressure, sneezing, sore throat and trouble swallowing.   Eyes: Negative.  Negative for redness and itching.  Respiratory: Positive for cough. Negative for chest tightness, shortness of breath and wheezing.   Cardiovascular: Negative.  Negative for palpitations and leg swelling.  Gastrointestinal: Negative.  Negative for nausea and vomiting.  Endocrine: Negative.   Genitourinary: Negative.  Negative for dysuria.  Musculoskeletal: Positive for arthralgias and joint swelling.  Skin: Negative.  Negative for rash.  Allergic/Immunologic: Negative.   Hematological: Negative.  Does not bruise/bleed easily.  Psychiatric/Behavioral: Negative.  Negative for dysphoric mood. The patient is not nervous/anxious.   All other systems reviewed and are negative.    Objective:   Physical Exam  Vitals:  Vitals:   02/06/16 1210  BP: 104/70  Pulse: 69  SpO2: 96%  Weight: 167 lb 3.2 oz (75.8 kg)  Height: 5\' 1"  (1.549 m)    Constitutional/General:  Pleasant, well-nourished, well-developed, not in any distress,  Comfortably seating.  Well kempt  Body mass index is 31.59 kg/m. Wt Readings from Last 3 Encounters:  02/06/16 167 lb 3.2 oz (75.8 kg)  01/16/16 171 lb 6.4 oz (77.7 kg)  01/10/16 170 lb 9.6 oz (77.4 kg)    HEENT: Pupils equal and reactive to light and accommodation. Anicteric sclerae. Normal nasal mucosa.   No oral  lesions,  mouth clear,  oropharynx clear, no postnasal drip. (-) Oral thrush. No dental caries.  Airway - Mallampati class III  Neck: No masses. Midline  trachea. No JVD, (-) LAD. (-) bruits appreciated.  Respiratory/Chest: Grossly normal chest. (-) deformity. (-) Accessory muscle use.  Symmetric expansion. (-) Tenderness on palpation.  Resonant on percussion.  Diminished BS on both lower lung zones. (-) wheezing, crackles, rhonchi (-) egophony  Cardiovascular: Regular rate and  rhythm, heart sounds normal, no murmur or gallops, no peripheral edema  Gastrointestinal:  Normal bowel sounds. Soft, non-tender. No hepatosplenomegaly.  (-) masses.   Musculoskeletal:  Normal muscle tone. Normal gait.   Extremities: Grossly normal. (-) clubbing, cyanosis.  (-) edema  Skin: (-) rash,lesions seen.   Neurological/Psychiatric : alert, oriented to time, place, person. Normal mood and affect            Assessment & Plan:  Cough Pt is being seen for cough. Cough since 2009, not affected by environment. No meds implicated. [place], [place] houses, cough still the same. Was in  A lot of stress last yr > cough the same.  Has occasional sinus issues/throat clearing.  Drinking carbonated drinks makes her cough better.  CXR 07/20/15 No abn FENO 08/24/15 = 13 PFT 11/29/15 No obstruction. No significant bronchodilator response. Normal diffusion capacity N Neck ct scan 11/2015  Chronic maxillary sinusitis Chest ct scan 11/2015 2 mm RUL nodule RAST : cottonwood and oak were elevated.   Cough, etiology: Likely still multifactorial 1. UACS. Still had frequent throat clearing during interview. Improved with PO prednisone. Not better with MDIs. MRI of the brain (10/2015) with some sinusitis. Cont flonase 2 sq at hs. cont singulair 10 mg hs. ENT saw the pt before I did.  No ENT follow up. Try benadryl 12.5 mg TID and inc zantact to BID.  May try detromethorphan or delsym (lower dose) on f/u. Hold off on MDI.  Not sure again if she tried spiriva > will consider on f/u.   2. GERD. Cont PPI BID. Inc zantac to BID.   3. Less likely to have  hyperreactive airway disease. She improved with prednisone but felt nauseated with Symbicort. PFT (11/2015) N. Holding off on further MDIs for now.      Exertional dyspnea Recent SOB likely 2/2 obesity. Chest x-ray and PFT unremarkable. Observe.  Obesity Weight reduction    Patient will follow up with me in 2 mos.     Pollie MeyerJ. Angelo A. de Dios, MD 02/06/2016   1:54 PM Pulmonary and Critical Care Medicine [facility] Pager: [phone] Office: [phone], Fax: [phone]

## 2016-02-06 NOTE — Assessment & Plan Note (Signed)
Recent SOB likely 2/2 obesity. Chest x-ray and PFT unremarkable. Observe.

## 2016-02-06 NOTE — Patient Instructions (Signed)
It was a pleasure taking care of you today!  Your cough is most likely multifactorial.   Start benadryl 12.5 mg 3x/day. Increase Zantac 150 mg 2x/day.    We will try this plan and see if your cough gets better. Please call the office if your cough worsens while on treatment.   Return to clinic in 6-8 weeks with Dr. Christene Slatese Dios

## 2016-02-06 NOTE — Assessment & Plan Note (Signed)
Pt is being seen for cough. Cough since 2009, not affected by environment. No meds implicated. [place]oved [place], moved houses, cough still the same. Was in  A lot of stress last yr > cough the same.  Has occasional sinus issues/throat clearing.  Drinking carbonated drinks makes her cough better.  CXR 07/20/15 No abn FENO 08/24/15 = 13 PFT 11/29/15 No obstruction. No significant bronchodilator response. Normal diffusion capacity N Neck ct scan 11/2015  Chronic maxillary sinusitis Chest ct scan 11/2015 2 mm RUL nodule RAST : cottonwood and oak were elevated.   Cough, etiology: Likely still multifactorial 1. UACS. Still had frequent throat clearing during interview. Improved with PO prednisone. Not better with MDIs. MRI of the brain (10/2015) with some sinusitis. Cont flonase 2 sq at hs. cont singulair 10 mg hs. ENT saw the pt before I did.  No ENT follow up. Try benadryl 12.5 mg TID and inc zantact to BID.  May try detromethorphan or delsym (lower dose) on f/u. Hold off on MDI.  Not sure again if she tried spiriva > will consider on f/u.   2. GERD. Cont PPI BID. Inc zantac to BID.   3. Less likely to have hyperreactive airway disease. She improved with prednisone but felt nauseated with Symbicort. PFT (11/2015) N. Holding off on further MDIs for now.

## 2016-02-06 NOTE — Assessment & Plan Note (Signed)
Weight reduction 

## 2016-02-08 ENCOUNTER — Other Ambulatory Visit: Payer: Self-pay | Admitting: Neurology

## 2016-02-12 ENCOUNTER — Ambulatory Visit
Admission: RE | Admit: 2016-02-12 | Discharge: 2016-02-12 | Disposition: A | Discharge status: Home / Self Care | Payer: Medicare Other | Source: Ambulatory Visit | Attending: Neurology | Admitting: Neurology

## 2016-02-12 DIAGNOSIS — G4483 Primary cough headache: Secondary | ICD-10-CM

## 2016-02-12 DIAGNOSIS — G4453 Primary thunderclap headache: Secondary | ICD-10-CM

## 2016-02-12 DIAGNOSIS — I6501 Occlusion and stenosis of right vertebral artery: Secondary | ICD-10-CM | POA: Diagnosis not present

## 2016-02-12 DIAGNOSIS — I6523 Occlusion and stenosis of bilateral carotid arteries: Secondary | ICD-10-CM | POA: Diagnosis not present

## 2016-02-12 MED ORDER — GADOBENATE DIMEGLUMINE 529 MG/ML IV SOLN
15.0000 mL | Freq: Once | INTRAVENOUS | Status: AC | PRN
  Administered 2016-02-12: 15 mL via INTRAVENOUS

## 2016-02-19 ENCOUNTER — Telehealth: Payer: Self-pay | Admitting: Neurology

## 2016-02-19 NOTE — Telephone Encounter (Signed)
I called and left a message for patient about her imaging which I reviewed in detail myself and also reviewed each image with neuroradiology. There is some proximal and distal stenosis of the vertebral arteries however I am not sure these are symptomatic, they would not cause her exertional headaches but I would like to make sure she is not having any symptoms consistent with these stenoses like dizziness or vision changes. If she calls, let me know so I can call her back. Would probably recommend a CTA of the neck to further investigate thanks.

## 2016-02-20 ENCOUNTER — Ambulatory Visit: Payer: Medicare Other | Admitting: Allergy and Immunology

## 2016-02-21 ENCOUNTER — Other Ambulatory Visit: Payer: Self-pay | Admitting: Neurology

## 2016-02-21 MED ORDER — TOPIRAMATE 25 MG PO TABS: 100.0000 mg | ORAL_TABLET | Freq: Every day | ORAL | 6 refills | Status: DC

## 2016-02-21 NOTE — Telephone Encounter (Signed)
The headaches are improved. Topiramate is working. She denies any dizziness with head movement, Discussed imaging with patient. Patient not experiencing dizziness with head movement, no vertigo, no acute vision changes or other symptoms that we would associated with symptomatic vertebral stenosis. Will increase topiramate.

## 2016-02-21 NOTE — Telephone Encounter (Signed)
Patient is returning a call. °

## 2016-02-21 NOTE — Telephone Encounter (Signed)
Dr Lucia GaskinsAhern- Pt returned your call, please call

## 2016-03-14 DIAGNOSIS — Z23 Encounter for immunization: Secondary | ICD-10-CM | POA: Diagnosis not present

## 2016-04-01 ENCOUNTER — Ambulatory Visit (INDEPENDENT_AMBULATORY_CARE_PROVIDER_SITE_OTHER): Payer: Medicare Other | Admitting: Pulmonary Disease

## 2016-04-01 ENCOUNTER — Encounter: Payer: Self-pay | Admitting: Pulmonary Disease

## 2016-04-01 VITALS — BP 110/68 | HR 64 | Ht 61.0 in | Wt 164.0 lb

## 2016-04-01 DIAGNOSIS — E669 Obesity, unspecified: Secondary | ICD-10-CM

## 2016-04-01 DIAGNOSIS — K219 Gastro-esophageal reflux disease without esophagitis: Secondary | ICD-10-CM

## 2016-04-01 DIAGNOSIS — G4710 Hypersomnia, unspecified: Secondary | ICD-10-CM | POA: Diagnosis not present

## 2016-04-01 DIAGNOSIS — G4700 Insomnia, unspecified: Secondary | ICD-10-CM

## 2016-04-01 DIAGNOSIS — R059 Cough, unspecified: Secondary | ICD-10-CM

## 2016-04-01 DIAGNOSIS — R05 Cough: Secondary | ICD-10-CM

## 2016-04-01 NOTE — Assessment & Plan Note (Addendum)
Pt is being seen for cough. Cough since 2009, not affected by environment. No meds implicated. [place], [place] houses, cough still the same. Was in  a lot of stress with the move.  Has occasional sinus issues/throat clearing.  Drinking carbonated drinks maked  her cough better.  CXR 07/20/15 No abn FENO 08/24/15 = 13 PFT 11/29/15 No obstruction. No significant bronchodilator response. Normal diffusion capacity N Neck ct scan 11/2015  Chronic maxillary sinusitis Chest ct scan 11/2015 2 mm RUL nodule RAST : cottonwood and oak were elevated.   Cough, etiology: Likely still multifactorial. BUT is is significantly better now, 90% better (03/2016)   1. UACS. - Improved with PO prednisone and this has been weaned off.   - Not better with MDIs. MDIs made her feel worse.  - Allergy MD taking care of the cough as well.  - She is better with qnasl but it  is not covered by insurance. Prior to that, she was on flonase. Letting pt decide on either qnasl or flonase. If qnasl, may need prior auth.  - Cont benadryl 12.5 mg TID prn and zantact daily - BID.   - Detromethorphan or delsym made her worse. May try lower dose on f/u if needed   2. GERD. Cont PPI BID. Cont Zantac at daily - BID. Dietary changes.

## 2016-04-01 NOTE — Patient Instructions (Signed)
It was a pleasure taking care of you today!  We will schedule you to have a sleep study to determine if you have sleep apnea.   We will get a lab sleep study.  You will be scheduled to have a lab sleep study in 4-6 weeks.  Someone from the sleep lab will call you in 2-3 days to schedule the study with you.  They usually have cancellations every night so most likely, they will have openings for a lab sleep study next week or so.  We encourage you to do your sleep study then if possible. Please give us a call in a week is no one from the sleep lab calls you in 2-3 days.   If the sleep study is positive, we will order you a CPAP  machine.  Please call the office if you do NOT receive your machine in the next 1-2 weeks.   Please make sure you use your CPAP device everytime you sleep.  We will monitor the usage of your machine per your insurance requirement.  Your insurance company may take the machine from you if you are not using it regularly.   Please clean the mask, tubings, filter, water reservoir with soapy water every week.  Please use distilled water for the water reservoir.   Please call the office or your machine provider (DME company) if you are having issues with the device.   Continue other medicines. Try to take trazodone in half, or 50 mg at bedtime.  5 Rules of Good Sleep Hygiene: 1. Go to bed only when sleepy. 2. Try to wake up at the same time everyday.  3. If you are not able to fall asleep within the hour, get up and do something boring and monotonous.  Continue doing this until you get sleepy and you fall asleep.  4. Avoid naps. 5. The bed should just be used for sleep and intimacy.    Return to clinic in 3 months with Dr. Christene Slatese Dios

## 2016-04-01 NOTE — Progress Notes (Signed)
Subjective:    Patient ID: Debbie Schultz, female    DOB: [birth year], 66 y.o.   MRN: [id number]  v   This is the case of Debbie Schultz, 66 y.o. Female, who was referred by Dr. Christia Reading in consultation regarding chronic cough.   As you very well know, patient has had the cough since 2009.  She was fine until cough slowly crept up on her. Over all, cough has been the same since 2009. What's different is the cough the last 1-1.5 yrs sometimes causes her to have HA which gets better when she lays down.  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 Last 1.5 yrs, cough causes her to have HA. Lying down helps with HA.  She does not know exact cause or trigger for cough. Sometimes associated with nasal congestion/drip, but not all the time.  Not necessarily worse cough at night. She was treated with flonase for yrs -- not really sure if cough got better. Was also treated for GERD with PPI for yrs -- not really better.   She left [facility] in Oct 2016 and drove cross country, going to [place] and [place] then finally making it here in [place] in Dec 2016. She has had cats forever, even before the cough.  She is now staying with her sister. House is old, relatively clean house, has carpets. (-) Molds.  Cough is NOT worse here compared to [facility], not worse at her current house compared to house in [facility].   Saw ENT and was given neurontin  100 mg TID.  Neurontin made  her really sleepy.   She was in a lot of stress at work last year but it is better now.  Cough was not worse with stress.   Non smoker. Denies asthma or copd.   ROV 11/29/2015  Patient returns to the office as follow-up on her cough. Since seeing her in May, she has seen Dr. Sherene Sires a couple of times. Her Nitric oxide test was negative. She was placed on prednisone taper which made the cough go away.  She was tried on Symbicort which did not help the cough. Symbicort made her nauseated. She Is back with her  baseline cough although HA is gone.   ROV  02/05/16  Pt is back for f/u on her cough.  Saw Allergy MD and was told was (-) for allergies. Neuro is seing  her. No f/u with GI.   ROV 04/02/15 Pt is here for f/u on her cough.  Cough is 90% better.  Not an issue now. Since last seen, allergy MD switched flonase to qnasal.  Qnasl helped her significantly. She uses kids benadryl 12.5 mg prn for cough. Takes zantac 300 mg daily. Takes protonix BID. Her main issue now is she feels like a "zombie". She is tired all the time, does not have energy. I have a sense this has been going on a while but she has not really noticed it because of the cough. Since the cough is better, it has become a main issue for her.   Review of Systems  Constitutional: Negative.  Negative for fever and unexpected weight change.       Gained weight x 20 lbs x 1.5 yrs.   HENT: Negative for congestion, dental problem, ear pain, nosebleeds, postnasal drip, rhinorrhea, sinus pressure, sneezing, sore throat and trouble swallowing.   Eyes: Negative.  Negative for redness and itching.  Respiratory: Positive for cough. Negative for chest  tightness, shortness of breath and wheezing.   Cardiovascular: Negative.  Negative for palpitations and leg swelling.  Gastrointestinal: Negative.  Negative for nausea and vomiting.  Endocrine: Negative.   Genitourinary: Negative.  Negative for dysuria.  Musculoskeletal: Positive for arthralgias and joint swelling.  Skin: Negative.  Negative for rash.  Allergic/Immunologic: Negative.   Hematological: Negative.  Does not bruise/bleed easily.  Psychiatric/Behavioral: Negative.  Negative for dysphoric mood. The patient is not nervous/anxious.   All other systems reviewed and are negative.    Objective:   Physical Exam  Vitals:  Vitals:   04/01/16 1521  BP: 110/68  Pulse: 64  SpO2: 98%  Weight: 164 lb (74.4 kg)  Height: 5\' 1"  (1.549 m)    Constitutional/General:  Pleasant, well-nourished,  well-developed, not in any distress,  Comfortably seating.  Well kempt Barely coughed during this encounter.  Body mass index is 30.99 kg/m. Wt Readings from Last 3 Encounters:  04/01/16 164 lb (74.4 kg)  02/06/16 167 lb 3.2 oz (75.8 kg)  01/16/16 171 lb 6.4 oz (77.7 kg)    HEENT: Pupils equal and reactive to light and accommodation. Anicteric sclerae. Normal nasal mucosa.   No oral  lesions,  mouth clear,  oropharynx clear, no postnasal drip. (-) Oral thrush. No dental caries.  Airway - Mallampati class III  Neck: No masses. Midline trachea. No JVD, (-) LAD. (-) bruits appreciated.  Respiratory/Chest: Grossly normal chest. (-) deformity. (-) Accessory muscle use.  Symmetric expansion. (-) Tenderness on palpation.  Resonant on percussion.  Diminished BS on both lower lung zones. (-) wheezing, crackles, rhonchi (-) egophony  Cardiovascular: Regular rate and  rhythm, heart sounds normal, no murmur or gallops, no peripheral edema  Gastrointestinal:  Normal bowel sounds. Soft, non-tender. No hepatosplenomegaly.  (-) masses.   Musculoskeletal:  Normal muscle tone. Normal gait.   Extremities: Grossly normal. (-) clubbing, cyanosis.  (-) edema  Skin: (-) rash,lesions seen.   Neurological/Psychiatric : alert, oriented to time, place, person. Normal mood and affect            Assessment & Plan:  Cough Pt is being seen for cough. Cough since 2009, not affected by environment. No meds implicated. [place]oved from [place] to [place], moved houses, cough still the same. Was in  a lot of stress with the move.  Has occasional sinus issues/throat clearing.  Drinking carbonated drinks maked  her cough better.  CXR 07/20/15 No abn FENO 08/24/15 = 13 PFT 11/29/15 No obstruction. No significant bronchodilator response. Normal diffusion capacity N Neck ct scan 11/2015  Chronic maxillary sinusitis Chest ct scan 11/2015 2 mm RUL nodule RAST : cottonwood and oak were elevated.   Cough, etiology:  Likely still multifactorial. BUT is is significantly better now, 90% better (03/2016)   1. UACS. - Improved with PO prednisone and this has been weaned off.   - Not better with MDIs. MDIs made her feel worse.  - Allergy MD taking care of the cough as well.  - She is better with qnasl but it  is not covered by insurance. Prior to that, she was on flonase. Letting pt decide on either qnasl or flonase. If qnasl, may need prior auth.  - Cont benadryl 12.5 mg TID prn and zantact daily - BID.   - Detromethorphan or delsym made her worse. May try lower dose on f/u if needed   2. GERD. Cont PPI BID. Cont Zantac at daily - BID. Dietary changes.     GERD (gastroesophageal reflux disease)  Cont PPI BID. Zantac daily - BID Dietary changes.  As she has been losing weight, her reflux is better and her cough is better.  Obesity Weight reduction  Hypersomnia Patient feels tired all the time. I think this is a chronic issue but was on the back burner has to cough was her main concern. Since the cough is better, she is starting to notice tiredness and fatigue all the time. She feels like a "zombie".   She has snoring, occasional gasping and choking, frequent awakenings, daily naps, hypersomnia, tiredness. Hypersomnia affects her functionality.  Her sleep schedule is all over the place. She takes naps anytime. Her sleep time is variable too. She is like a teenager who goes to bed anytime but does not have to wake up early because she does not have to work.  She also takes sedating medicines.  Hypersomnia differentials : 1. 2/2 sedating meds. She takes trazodone, Remeron, H2 and H1 blockers.  2. Poor sleep hygiene 3. Possible OSA  Plan : 1. I extensively discussed with her my differentials. 2. Regarding sedating meds, she states she has been on trazodone and Remeron for many many years. I suggested decreasing trazodone dose to 50 mg at bedtime. Remeron was also increased recently. I suggested maybe  cutting down dose to 25 mg at bedtime from 30 mg. 3. More importantly, we discussed about good sleep habits and hygiene. I discussed about the importance of going to bed when sleepy, waking up at the same time everyday, avoiding naps. We also discussed the 20 minute room. We also discussed stimulus control. She does read in bed and lays in bed for hours if she is not able to fall sleep. I suggested, bed she just be use for sleep and sleep. If she is not able to sleep within 20 minutes, she should get out of the bed and do something boring monotonous. I also discussed about winding down prior to going to bed. She should prepare herself prior to going to bed and sleep. I told her she should only go to bed when she is sleepy. 4. If this becomes a persistent problem, she may need to follow up with psychiatry. I'm not sure she'll need cognitive behavioral therapy. 5. Plan for a sleep study. We will plan on getting a split-night sleep study. If it's negative, plan to get a home sleep study.  Insomnia As discussed in hypersomnia. We discussed about stimulus control, good sleep hygiene. Need to R/O OSA.  Plan to cut  Down trazodone dose from 100 mg to 50 mg/hs.  Plan to cut down remeron dose from 30 mg to 25 mg.     Patient will follow up with me in 3 mos.     Pollie Meyer, MD 04/02/2016   9:48 AM Pulmonary and Critical Care Medicine  HealthCare Pager: [phone] Office: [phone], Fax: [phone]

## 2016-04-02 DIAGNOSIS — K219 Gastro-esophageal reflux disease without esophagitis: Secondary | ICD-10-CM | POA: Insufficient documentation

## 2016-04-02 DIAGNOSIS — G4710 Hypersomnia, unspecified: Secondary | ICD-10-CM | POA: Insufficient documentation

## 2016-04-02 DIAGNOSIS — G4700 Insomnia, unspecified: Secondary | ICD-10-CM | POA: Insufficient documentation

## 2016-04-02 NOTE — Assessment & Plan Note (Signed)
Weight reduction 

## 2016-04-02 NOTE — Assessment & Plan Note (Signed)
As discussed in hypersomnia. We discussed about stimulus control, good sleep hygiene. Need to R/O OSA.  Plan to cut  Down trazodone dose from 100 mg to 50 mg/hs.  Plan to cut down remeron dose from 30 mg to 25 mg.

## 2016-04-02 NOTE — Assessment & Plan Note (Addendum)
Patient feels tired all the time. I think this is a chronic issue but was on the back burner has to cough was her main concern. Since the cough is better, she is starting to notice tiredness and fatigue all the time. She feels like a "zombie".   She has snoring, occasional gasping and choking, frequent awakenings, daily naps, hypersomnia, tiredness. Hypersomnia affects her functionality.  Her sleep schedule is all over the place. She takes naps anytime. Her sleep time is variable too. She is like a teenager who goes to bed anytime but does not have to wake up early because she does not have to work.  She also takes sedating medicines.  Hypersomnia differentials : 1. 2/2 sedating meds. She takes trazodone, Remeron, H2 and H1 blockers.  2. Poor sleep hygiene 3. Possible OSA  Plan : 1. I extensively discussed with her my differentials. 2. Regarding sedating meds, she states she has been on trazodone and Remeron for many many years. I suggested decreasing trazodone dose to 50 mg at bedtime. Remeron was also increased recently. I suggested maybe cutting down dose to 25 mg at bedtime from 30 mg. 3. More importantly, we discussed about good sleep habits and hygiene. I discussed about the importance of going to bed when sleepy, waking up at the same time everyday, avoiding naps. We also discussed the 20 minute room. We also discussed stimulus control. She does read in bed and lays in bed for hours if she is not able to fall sleep. I suggested, bed she just be use for sleep and sleep. If she is not able to sleep within 20 minutes, she should get out of the bed and do something boring monotonous. I also discussed about winding down prior to going to bed. She should prepare herself prior to going to bed and sleep. I told her she should only go to bed when she is sleepy. 4. If this becomes a persistent problem, she may need to follow up with psychiatry. I'm not sure she'll need cognitive behavioral therapy. 5.  Plan for a sleep study. We will plan on getting a split-night sleep study. If it's negative, plan to get a home sleep study.

## 2016-04-02 NOTE — Assessment & Plan Note (Signed)
Cont PPI BID. Zantac daily - BID Dietary changes.  As she has been losing weight, her reflux is better and her cough is better.

## 2016-04-10 DIAGNOSIS — Z5181 Encounter for therapeutic drug level monitoring: Secondary | ICD-10-CM | POA: Diagnosis not present

## 2016-04-11 NOTE — Telephone Encounter (Signed)
If she is feeling better she can cancel thanks

## 2016-04-11 NOTE — Telephone Encounter (Signed)
Dr Lucia GaskinsAhern- she wants to know if she needs to keep her f/u or can see cancel this?

## 2016-04-11 NOTE — Telephone Encounter (Signed)
Pt called says she doesn't want to take topiramate anymore. Says it has made her loose her appetite that she doesn't want to eat at all. Pt said she is not having any HA's and the coughing is under control also. Please call at [phone]

## 2016-04-11 NOTE — Telephone Encounter (Signed)
Called patient back. She said she is wanting to stop topiramatel. She has lost appetite and having Diarrhea that comes and goes. She has had this since December. She is not sure what the cause of this is.  She stopped coughing and no longer having headache. Therefore she would like to stop topiramate if ok with Dr Lucia GaskinsAhern. She does not feel she needs this anymore. She cut back to one tablet daily.   She also is wondering if she needs to keep f/u on 05/09/16 if she stops the medication. Advised I will send to AA,MD and call back to advise tomorrow at the latest. AA,MD still seeing patient's for the day. She verbalized understanding.

## 2016-04-11 NOTE — Telephone Encounter (Signed)
Tell her to stop it and keep us posted on how she does. thanks

## 2016-04-12 NOTE — Telephone Encounter (Signed)
Called and spoke to pt. Advised she can stop topiramate. Patient would like to cancel f/u. She will call back to schedule f/u in future if needed.    Cancelled 05/09/16 f/u with MM,NP

## 2016-04-15 ENCOUNTER — Encounter: Payer: Self-pay | Admitting: Pulmonary Disease

## 2016-04-15 ENCOUNTER — Telehealth: Payer: Self-pay | Admitting: Pulmonary Disease

## 2016-04-15 ENCOUNTER — Ambulatory Visit (INDEPENDENT_AMBULATORY_CARE_PROVIDER_SITE_OTHER): Payer: Medicare Other | Admitting: Pulmonary Disease

## 2016-04-15 DIAGNOSIS — R197 Diarrhea, unspecified: Secondary | ICD-10-CM

## 2016-04-15 DIAGNOSIS — G4710 Hypersomnia, unspecified: Secondary | ICD-10-CM | POA: Diagnosis not present

## 2016-04-15 NOTE — Assessment & Plan Note (Signed)
Hypersomnia better with avoiding naps. Pt to have sleep study in 05/2016.

## 2016-04-15 NOTE — Telephone Encounter (Signed)
Spoke with pt. She is having diarrhea. Feels this is coming from her Pantoprazole and Ranitidine. Pt requests to have an appointment. OV has been scheduled for today at 10:45am with AD.

## 2016-04-15 NOTE — Patient Instructions (Signed)
It was a pleasure taking care of you today!  Diarrhea might be related to Protonix or Ranitidine (Zantac)  Stop am dose of Protonix.  If nausea and diarrhea are persistent after 2-3 days, stop the pm dose of Protonix. If nausea and diarrhea are persistent after 2-3 days, stop the pm dose of Ranitidine.  If nausea and diarrhea are persistent after stopping Protonix and Ranitidine, call the office. You will need GI evaluation at that point.   Return to clinic as scheduled.

## 2016-04-15 NOTE — Assessment & Plan Note (Signed)
Patient with nausea, occasional vomiting, loose stools, watery. Started in December 2017. She was started on Protonix twice a day and eventually ranitidine at bedtime for her relentless cough. Cough is better. Because of her GI symptoms, she has anorexia and has lost weight.  I want to believe her GI symptoms are related to her medicines. We plan on stopping her morning dose of Protonix. If the GI sx are persistent, she was instructed to stop her evening dose of protonix. If they're still persistent, she was asked to stop her nighttime ranitidine. If the GI symptoms are persistent despite stopping these meds, I told her to give us a call because at that point, she will need GI evaluation.  If the symptoms are better but the cough is worse, we'll need to give her something for her reflux such as Sucralfate.   Pt will follow up.

## 2016-04-15 NOTE — Progress Notes (Signed)
Subjective:    Patient ID: Debbie Schultz, female    DOB: 09/11/1950, 66 y.o.   MRN: [id number]  [id number]   This is the case of Debbie Schultz, 66 y.o. Female, who was referred by Dr. Christia Readingwight Bates in consultation regarding chronic cough.   As you very well know, patient has had the cough since 2009.  She was fine until cough slowly crept up on her. Over all, cough has been the same since 2009. What's different is the cough the last 1-1.5 yrs sometimes causes her to have HA which gets better when she lays down.  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 Last 1.5 yrs, cough causes her to have HA. Lying down helps with HA.  She does not know exact cause or trigger for cough. Sometimes associated with nasal congestion/drip, but not all the time.  Not necessarily worse cough at night. She was treated with flonase for yrs -- not really sure if cough got better. Was also treated for GERD with PPI for yrs -- not really better.   She left [facility] [place] in Oct 2016 and drove cross country, going to [place] and [place] then finally making it here in [place] in Dec 2016. She has had cats forever, even before the cough.  She is now staying with her sister. House is old, relatively clean house, has carpets. (-) Molds.  Cough is NOT worse here compared to [facility], not worse at her current house compared to [place] in [facility].   Saw ENT and was given neurontin  100 mg TID.  Neurontin made  her really sleepy.   She was in a lot of stress at work last year but it is better now.  Cough was not worse with stress.   Non smoker. Denies asthma or copd.   ROV 11/29/2015  Patient returns to the office as follow-up on her cough. Since seeing her in May, she has seen Dr. Sherene SiresWert a couple of times. Her Nitric oxide test was negative. She was placed on prednisone taper which made the cough go away.  She was tried on Symbicort which did not help the cough. Symbicort made her nauseated. She Is back with her  baseline cough although HA is gone.   ROV  02/05/16  Pt is back for f/u on her cough.  Saw Allergy MD and was told was (-) for allergies. Neuro is seing  her. No f/u with GI.   ROV 04/02/15 Pt is here for f/u on her cough.  Cough is 90% better.  Not an issue now. Since last seen, allergy MD switched flonase to qnasal.  Qnasl helped her significantly. She uses kids benadryl 12.5 mg prn for cough. Takes zantac 300 mg daily. Takes protonix BID. Her main issue now is she feels like a "zombie". She is tired all the time, does not have energy. I have a sense this has been going on a while but she has not really noticed it because of the cough. Since the cough is better, it has become a main issue for her.  ROV 04/15/2016 patient returns to the office urgently as she is having a reaction to one of her medicines. She has been having nausea, poor by mouth intake, soft stools and diarrhea since December 2017. She started Protonix and ranitidine couple months before that. Denies bloody stools. She has anorexia and has lost weight because of this. On a different subject, her sleep is better as she has avoided  naps. She is scheduled to have a sleep study in March. Her cough remains stable as well.  Review of Systems  Constitutional: Negative.  Negative for fever and unexpected weight change.       Gained weight x 20 lbs x 1.5 yrs.   HENT: Negative for congestion, dental problem, ear pain, nosebleeds, postnasal drip, rhinorrhea, sinus pressure, sneezing, sore throat and trouble swallowing.   Eyes: Negative.  Negative for redness and itching.  Respiratory: Positive for cough. Negative for chest tightness, shortness of breath and wheezing.   Cardiovascular: Negative.  Negative for palpitations and leg swelling.  Gastrointestinal: Positive for diarrhea, nausea and vomiting.  Endocrine: Negative.   Genitourinary: Negative.  Negative for dysuria.  Musculoskeletal: Positive for arthralgias and joint swelling.  Skin:  Negative.  Negative for rash.  Allergic/Immunologic: Negative.   Hematological: Negative.  Does not bruise/bleed easily.  Psychiatric/Behavioral: Negative.  Negative for dysphoric mood. The patient is not nervous/anxious.   All other systems reviewed and are negative.    Objective:   Physical Exam  Vitals:  Vitals:   04/15/16 1104  BP: 102/70  Pulse: 93  SpO2: 96%  Weight: 155 lb (70.3 kg)  Height: 5\' 1"  (1.549 m)    Constitutional/General:  Pleasant, well-nourished, well-developed, not in any distress,  Comfortably seating.  Well kempt Barely coughed during this encounter.  Body mass index is 29.29 kg/m. Wt Readings from Last 3 Encounters:  04/15/16 155 lb (70.3 kg)  04/01/16 164 lb (74.4 kg)  02/06/16 167 lb 3.2 oz (75.8 kg)    HEENT: Pupils equal and reactive to light and accommodation. Anicteric sclerae. Normal nasal mucosa.   No oral  lesions,  mouth clear,  oropharynx clear, no postnasal drip. (-) Oral thrush. No dental caries.  Airway - Mallampati class III  Neck: No masses. Midline trachea. No JVD, (-) LAD. (-) bruits appreciated.  Respiratory/Chest: Grossly normal chest. (-) deformity. (-) Accessory muscle use.  Symmetric expansion. (-) Tenderness on palpation.  Resonant on percussion.  Diminished BS on both lower lung zones. (-) wheezing, crackles, rhonchi (-) egophony  Cardiovascular: Regular rate and  rhythm, heart sounds normal, no murmur or gallops, no peripheral edema  Gastrointestinal:  Normal bowel sounds. Soft, non-tender. No hepatosplenomegaly.  (-) masses.   Musculoskeletal:  Normal muscle tone. Normal gait.   Extremities: Grossly normal. (-) clubbing, cyanosis.  (-) edema  Skin: (-) rash,lesions seen.   Neurological/Psychiatric : alert, oriented to time, place, person. Normal mood and affect            Assessment & Plan:  Diarrhea Patient with nausea, occasional vomiting, loose stools, watery. Started in December 2017. She  was started on Protonix twice a day and eventually ranitidine at bedtime for her relentless cough. Cough is better. Because of her GI symptoms, she has anorexia and has lost weight.  I want to believe her GI symptoms are related to her medicines. We plan on stopping her morning dose of Protonix. If the GI sx are persistent, she was instructed to stop her evening dose of protonix. If they're still persistent, she was asked to stop her nighttime ranitidine. If the GI symptoms are persistent despite stopping these meds, I told her to give [place] a call because at that point, she will need GI evaluation.  If the symptoms are better but the cough is worse, we'll need to give her something for her reflux such as Sucralfate.   Pt will follow up.   Hypersomnia Hypersomnia better with  avoiding naps. Pt to have sleep study in 05/2016.     Patient will follow up with me in March after her sleep study.     Pollie Meyer, MD 04/15/2016   11:34 AM Pulmonary and Critical Care Medicine [facility] Pager: [phone] Office: [phone], Fax: [phone]

## 2016-04-25 DIAGNOSIS — F418 Other specified anxiety disorders: Secondary | ICD-10-CM | POA: Diagnosis not present

## 2016-04-25 DIAGNOSIS — R410 Disorientation, unspecified: Secondary | ICD-10-CM | POA: Diagnosis not present

## 2016-04-25 DIAGNOSIS — R197 Diarrhea, unspecified: Secondary | ICD-10-CM | POA: Diagnosis not present

## 2016-04-25 DIAGNOSIS — R634 Abnormal weight loss: Secondary | ICD-10-CM | POA: Diagnosis not present

## 2016-05-09 ENCOUNTER — Ambulatory Visit: Payer: Medicare Other | Admitting: Adult Health

## 2016-05-14 DIAGNOSIS — F418 Other specified anxiety disorders: Secondary | ICD-10-CM | POA: Diagnosis not present

## 2016-05-14 DIAGNOSIS — R197 Diarrhea, unspecified: Secondary | ICD-10-CM | POA: Diagnosis not present

## 2016-05-16 ENCOUNTER — Ambulatory Visit (HOSPITAL_BASED_OUTPATIENT_CLINIC_OR_DEPARTMENT_OTHER): Payer: Medicare Other | Attending: Pulmonary Disease | Admitting: Pulmonary Disease

## 2016-05-16 DIAGNOSIS — G4710 Hypersomnia, unspecified: Secondary | ICD-10-CM | POA: Diagnosis present

## 2016-05-16 DIAGNOSIS — G4733 Obstructive sleep apnea (adult) (pediatric): Secondary | ICD-10-CM | POA: Insufficient documentation

## 2016-05-16 DIAGNOSIS — R197 Diarrhea, unspecified: Secondary | ICD-10-CM | POA: Diagnosis not present

## 2016-05-16 DIAGNOSIS — Z79899 Other long term (current) drug therapy: Secondary | ICD-10-CM | POA: Insufficient documentation

## 2016-05-18 DIAGNOSIS — Z5181 Encounter for therapeutic drug level monitoring: Secondary | ICD-10-CM | POA: Diagnosis not present

## 2016-05-22 ENCOUNTER — Telehealth: Payer: Self-pay | Admitting: Pulmonary Disease

## 2016-05-22 DIAGNOSIS — G4710 Hypersomnia, unspecified: Secondary | ICD-10-CM | POA: Diagnosis not present

## 2016-05-22 NOTE — Telephone Encounter (Signed)
Called and spoke to pt. Informed her of the results and recs per AD. Pt verbalized understanding and denied any further questions or concerns at this time.

## 2016-05-22 NOTE — Procedures (Signed)
    NAME: Debbie FlakeKathryn Schultz DATE OF BIRTH:  05/27/1950 MEDICAL RECORD NUMBER [id number]  LOCATION: [facility]  PHYSICIAN: Daneen SchickJose Angelo A De Dios  DATE OF STUDY: 05/16/2016   CLINICAL INFORMATION  Sleep Study Type: NPSG  Indication for sleep study: Excessive Daytime Sleepiness   Epworth Sleepiness Score: 6   SLEEP STUDY TECHNIQUE  As per the AASM Manual for the Scoring of Sleep and Associated Events v2.3 (April 2016) with a hypopnea requiring 4% desaturations.  The channels recorded and monitored were frontal, central and occipital EEG, electrooculogram (EOG), submentalis EMG (chin), nasal and oral airflow, thoracic and abdominal wall motion, anterior tibialis EMG, snore microphone, electrocardiogram, and pulse oximetry.   MEDICATIONS  Medications self-administered by patient taken the night of the study : PROPRANOLOL, NAPROXEM, MONTELUKAST, TRAZADONE, TEMAZEPAM, LITHIUM. Meds reviewed per chart review.  SLEEP ARCHITECTURE  The study was initiated at 9:44:50 PM and ended at 4:32:42 AM.  Sleep onset time was 15.7 minutes and the sleep efficiency was 93.7%. The total sleep time was 382.1 minutes.  Stage REM latency was 98.5 minutes.  The patient spent 1.05% of the night in stage N1 sleep, 65.85% in stage N2 sleep, 0.00% in stage N3 and 33.10% in REM.  Alpha intrusion was absent.  Supine sleep was 58.91%.   RESPIRATORY PARAMETERS  The overall apnea/hypopnea index (AHI) was 2.8 per hour. There were 3 total apneas, including 3 obstructive, 0 central and 0 mixed apneas. There were 15 hypopneas and 2 RERAs.  The AHI during Stage REM sleep was 7.6 per hour. AHI while supine was 4.8 per hour.  The mean oxygen saturation was 90.76%. The minimum SpO2 during sleep was 80.00%.  Moderate snoring was noted during this study.  CARDIAC DATA  The 2 lead EKG demonstrated sinus rhythm. The mean heart rate was 74.70 beats per minute. Other EKG findings include: None.   LEG  MOVEMENT DATA   The total PLMS were 9 with a resulting PLMS index of 1.41. Associated arousal with   leg movement index was 0.0 .  IMPRESSIONS  1. No significant obstructive sleep apnea occurred during this study (AHI = 2.8/h). Patient had mild REM related OSA.  2. No significant central sleep apnea occurred during this study (CAI = 0.0/h). 3. Moderate oxygen desaturation was noted during this study (Min O2 = 80.00%). 4. The patient snored with Moderate snoring volume. 5. No cardiac abnormalities were noted during this study. 6. Clinically significant periodic limb movements did not occur during sleep. No significant associated arousals. 7.  DIAGNOSIS  1. No evidence for significant OSA based on this study. 2.   Patient had mild REM-related OSA  RECOMMENDATIONS  1. No evidence for significant OSA based on this study. Patient had mild REM related OSA. Clinical correlation regarding underlying hypersomnia. This may be related to medicines or poor sleep hygiene. 2. Avoid alcohol, sedatives and other CNS depressants that may worsen sleep apnea and disrupt normal sleep architecture. 3. Sleep hygiene should be reviewed to assess factors that may improve sleep quality. 4. Weight management and regular exercise should be initiated or continued if appropriate.  Pollie MeyerJ. Angelo A de Dios, MD 05/22/2016, 8:57 AM [facility] Pager [phone] After 3 pm or if no answer, call [phone]

## 2016-05-22 NOTE — Telephone Encounter (Signed)
Patient is returning phone call.  °

## 2016-05-22 NOTE — Telephone Encounter (Signed)
   Pls tell pt the lab study was (-) for significant OSA. Will discuss results further on f/u.  =thanks.   Pollie MeyerJ. Angelo A de Dios, MD 05/22/2016, 8:59 AM [facility] Pager [phone] After 3 pm or if no answer, call [phone]

## 2016-05-22 NOTE — Telephone Encounter (Signed)
[id number] x1 

## 2016-06-06 DIAGNOSIS — R197 Diarrhea, unspecified: Secondary | ICD-10-CM | POA: Diagnosis not present

## 2016-06-17 DIAGNOSIS — F332 Major depressive disorder, recurrent severe without psychotic features: Secondary | ICD-10-CM | POA: Diagnosis not present

## 2016-06-21 DIAGNOSIS — R251 Tremor, unspecified: Secondary | ICD-10-CM | POA: Diagnosis not present

## 2016-06-28 ENCOUNTER — Ambulatory Visit: Payer: Medicare Other | Admitting: Pulmonary Disease

## 2016-07-02 NOTE — Progress Notes (Signed)
Subjective:   Debbie Schultz was seen in consultation in the movement disorder clinic at the request of POLITE,RONALD D, MD.  This patient is accompanied in the office by her sister who supplements the history. The evaluation is for tremor.  Tremor started approximately 4 months ago and involves the bilateral UE.  Tremor is most noticeable when she uses the hands/arms.  The patient believes that tremor is related to a new mattress.  She got a foam top to fit over the mattress and thinks that it helped.  She notes that she wakes up with her distal arms numb.  She got a second foam topper and it helped even more.  There is no family hx of tremor, except maybe her mother who is 60.    Affected by caffeine:  Unknown (quit drinking it 6 months ago) Affected by alcohol:  Unknown (drinks rarely - 1 time a month) Affected by stress:  No. Affected by fatigue:  No. Spills soup if on spoon:  unknown Spills glass of liquid if full:  Yes.   Affects ADL's (tying shoes, brushing teeth, etc):  No.  Current/Previously tried tremor medications: no but on propranolol since 2016 for tachycardia  Current medications that may exacerbate tremor:  lithium (on this medication since february); d/c abilify about 4 months ago; singulair (been on x 6 months)  Outside reports reviewed: lab reports, office notes and referral letter/letters.  Allergies  Allergen Reactions  . Symbicort [Budesonide-Formoterol Fumarate] Nausea Only  . Bupropion Rash  . Sulfamethoxazole Rash    Outpatient Encounter Prescriptions as of 07/03/2016  Medication Sig  . Cholecalciferol (VITAMIN D3) 10000 units TABS Take by mouth.  . desvenlafaxine (PRISTIQ) 50 MG 24 hr tablet Take 50 mg by mouth every morning.  Marland Kitchen LIOTHYRONINE SODIUM PO Take 1 tablet by mouth 2 (two) times daily.  . montelukast (SINGULAIR) 10 MG tablet TAKE 1 TABLET BY MOUTH AT BEDTIME  . Multiple Vitamin (MULTIVITAMIN) capsule Take 1 capsule by mouth daily.  . Multiple  Vitamins-Minerals (ZINC PO) Take by mouth.  . naproxen (NAPROSYN) 250 MG tablet Take 250 mg by mouth 2 (two) times daily.  . OMEGA-3 FATTY ACIDS PO Take by mouth.  . propranolol (INDERAL) 10 MG tablet Take 10 mg by mouth 2 (two) times daily.   . temazepam (RESTORIL) 15 MG capsule Take 15 mg by mouth at bedtime. Pt. Takes  at bedtime  . traZODone (DESYREL) 100 MG tablet Take 300 mg by mouth at bedtime.  . [DISCONTINUED] ARIPiprazole (ABILIFY) 10 MG tablet Take 10 mg by mouth daily.  . [DISCONTINUED] Beclomethasone Dipropionate (QNASL) 80 MCG/ACT AERS Use 2 puffs in each nostril once daily as directed.  . [DISCONTINUED] busPIRone (BUSPAR) 5 MG tablet Take 1 tablet by mouth 2 (two) times daily.  . [DISCONTINUED] pantoprazole (PROTONIX) 40 MG tablet Take 1 tablet (40 mg total) by mouth 2 (two) times daily.  . [DISCONTINUED] ranitidine (ZANTAC) 300 MG tablet Take 1 tablet (300 mg total) by mouth at bedtime.  . [DISCONTINUED] topiramate (TOPAMAX) 25 MG tablet Take 4 tablets (100 mg total) by mouth at bedtime. (Patient not taking: Reported on 04/15/2016)   No facility-administered encounter medications on file as of 07/03/2016.     Past Medical History:  Diagnosis Date  . Anxiety   . Depression   . Headache    resolved  . Hypothyroid   . Insomnia     Past Surgical History:  Procedure Laterality Date  . BLADDER SURGERY    .  CATARACT EXTRACTION, BILATERAL    . KNEE ARTHROSCOPY    . REPLACEMENT TOTAL KNEE Right 2008  . REPLACEMENT TOTAL KNEE Left 2012  . TONSILLECTOMY      Social History   Social History  . Marital status: Unknown    Spouse name: N/A  . Number of children: 0  . Years of education: Masters   Occupational History  . Retired     Runner, broadcasting/film/video   Social History Main Topics  . Smoking status: Never Smoker  . Smokeless tobacco: Never Used  . Alcohol use 0.0 oz/week     Comment: once a month  . Drug use: No  . Sexual activity: Not on file   Other Topics Concern  .  Not on file   Social History Narrative   Lives with mother.   Caffeine use: 1 cup coffee every morning   Tea rare    Family Status  Relation Status  . Mother Alive  . Father Deceased  . Maternal Aunt   . Paternal Grandmother   . Sister Alive  . Brother Alive  . Neg Hx     Review of Systems A complete 10 system ROS was obtained and was negative apart from what is mentioned.   Objective:   VITALS:   Vitals:   07/03/16 0751  BP: 110/68  Pulse: 74  SpO2: 98%  Weight: 144 lb (65.3 kg)  Height:  (1.575 m)   Gen:  Appears stated age and in NAD. HEENT:  Normocephalic, atraumatic. The mucous membranes are moist. The superficial temporal arteries are without ropiness or tenderness. Cardiovascular: Regular rate and rhythm. Lungs: Clear to auscultation bilaterally. Neck: There are no carotid bruits noted bilaterally.  NEUROLOGICAL:  Orientation:  The patient is alert and oriented x 3.  Recent and remote memory are intact.  Attention span and concentration are normal.  Able to name objects and repeat without trouble.  Fund of knowledge is appropriate Cranial nerves: There is good facial symmetry.   There is facial hypomimia.  The pupils are equal round and reactive to light bilaterally. Fundoscopic exam reveals clear disc margins bilaterally. Extraocular muscles are intact and visual fields are full to confrontational testing. Speech is fluent and clear. Soft palate rises symmetrically and there is no tongue deviation. Hearing is intact to conversational tone. Tone: Tone is good throughout. Sensation: Sensation is intact to light touch and pinprick throughout (facial, trunk, extremities). Vibration is intact at the bilateral big toe. There is no extinction with double simultaneous stimulation. There is no sensory dermatomal level identified. Coordination:  The patient has no dysdiadichokinesia or dysmetria. Motor: Strength is 5/5 in the bilateral upper and lower extremities.   Shoulder shrug is equal bilaterally.  There is no pronator drift.  There are no fasciculations noted. DTR's: Deep tendon reflexes are 2+/4 at the bilateral biceps, triceps, brachioradialis,1/4 at the bilateral patella and achilles.  Plantar responses are downgoing bilaterally. Gait and Station: The patient is able to ambulate without difficulty. The patient is able to heel toe walk without any difficulty. The patient is able to ambulate in a tandem fashion. The patient is able to stand in the Romberg position.   MOVEMENT EXAM: Tremor:  There is almost no tremor of the outstretched hands.  No significant increase in tremor with given a weight in the hand bilaterally.  Very minor tremor is noted with Archimedes spirals.  There is no tremor at rest.  The patient is able to pour water from one glass  to another without spilling it but mild tremor is noted.  Labs:  I reviewed labs from her primary care physician.  Her TSH was 2.58 on 04/25/2016.  Other labs done that day demonstrating a sodium of 145, potassium 4.3, chloride 107, CO2 31, BUN 19 and creatinine 0.97.  Glucose was 108.  White blood cells were 10.1, hemoglobin 15.4, hematocrit 45.9 and platelets 314.  Lithium level was 0.6.  Calcium was slightly elevated at 10.9 and this has been followed by her primary care physician.     Assessment/Plan:   1.  .Tremor  -I have explained to the patient that tremor is likely secondary to lithium.  About one third of patients who are on lithium will experience lithium-induced tremor and patients do not have to be toxic on lithium to have tremor.  Explained to the patient that this is a common side effect of lithium.  I also explained to the patient that I generally do not put patients on a medication to combat a side effect of another medication.I also explained that I generally do not recommend that patients discontinue lithium if it is working well, especially in patients who have very mild tremor such as  hers.  However, the patient finds it very bothersome and wants to talk to her prescribing nurse practitioner, Corie Chiquito.  She asked me to send my notes to her and signed a release in that regard.  -pt also has facial hypomimia and that may have been from abilify, which she was on for over a year.  That was stopped 4 months ago.  Parkinsonian sx's can be seen up to 6 months from this med.  No other PD sx's noted.  2.  Hand paresthesias.  -Patient thought that tremor and hand paresthesias were related to a new mattress.  I explained to her that tremor would be unrelated to her mattress.  Told her that hand paresthesias could be from carpal tunnel or even ulnar neuropathy and the symptoms can come out at night.  Offered EMG.  Patient declined.  Talked about trying to wear cockup wrist splints at night.  She will let me know if she changes her mind on EMG testing.  3.  She will follow-up with me on an as-needed basis.  I'm happy to see her if symptoms continue, progress or if new neurologic issues arise.  Much greater than 50% of this visit was spent in counseling and coordinating care.  Total face to face time:  45 min   CC:  Katy Apo, MD

## 2016-07-03 ENCOUNTER — Encounter: Payer: Self-pay | Admitting: Neurology

## 2016-07-03 ENCOUNTER — Ambulatory Visit (INDEPENDENT_AMBULATORY_CARE_PROVIDER_SITE_OTHER): Payer: Medicare Other | Admitting: Neurology

## 2016-07-03 VITALS — BP 110/68 | HR 74 | Ht 62.0 in | Wt 144.0 lb

## 2016-07-03 DIAGNOSIS — G251 Drug-induced tremor: Secondary | ICD-10-CM

## 2016-07-03 DIAGNOSIS — R202 Paresthesia of skin: Secondary | ICD-10-CM | POA: Diagnosis not present

## 2016-07-03 NOTE — Progress Notes (Signed)
Note sent to Corie Chiquito, NP.

## 2016-07-08 DIAGNOSIS — F332 Major depressive disorder, recurrent severe without psychotic features: Secondary | ICD-10-CM | POA: Diagnosis not present

## 2016-07-31 ENCOUNTER — Encounter: Payer: Self-pay | Admitting: Pulmonary Disease

## 2016-07-31 ENCOUNTER — Ambulatory Visit (INDEPENDENT_AMBULATORY_CARE_PROVIDER_SITE_OTHER)
Admission: RE | Admit: 2016-07-31 | Discharge: 2016-07-31 | Disposition: A | Discharge status: Home / Self Care | Payer: Medicare Other | Source: Ambulatory Visit | Attending: Pulmonary Disease | Admitting: Pulmonary Disease

## 2016-07-31 ENCOUNTER — Ambulatory Visit (INDEPENDENT_AMBULATORY_CARE_PROVIDER_SITE_OTHER): Payer: Medicare Other | Admitting: Pulmonary Disease

## 2016-07-31 VITALS — BP 102/70 | HR 88 | Ht 62.0 in | Wt 142.0 lb

## 2016-07-31 DIAGNOSIS — R059 Cough, unspecified: Secondary | ICD-10-CM

## 2016-07-31 DIAGNOSIS — R0602 Shortness of breath: Secondary | ICD-10-CM | POA: Diagnosis not present

## 2016-07-31 DIAGNOSIS — R05 Cough: Secondary | ICD-10-CM

## 2016-07-31 DIAGNOSIS — R911 Solitary pulmonary nodule: Secondary | ICD-10-CM

## 2016-07-31 NOTE — Progress Notes (Signed)
Subjective:    Patient ID: Debbie FlakeKathryn Barreira, female    DOB: 09/11/1950, 66 y.o.   MRN: [id number]  v   This is the case of Debbie Schultz, 66 y.o. Female, who was referred by Dr. Christia Readingwight Bates in consultation regarding chronic cough.   As you very well know, patient has had the cough since 2009.  She was fine until cough slowly crept up on her. Over all, cough has been the same since 2009. What's different is the cough the last 1-1.5 yrs sometimes causes her to have HA which gets better when she lays down.  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 Last 1.5 yrs, cough causes her to have HA. Lying down helps with HA.  She does not know exact cause or trigger for cough. Sometimes associated with nasal congestion/drip, but not all the time.  Not necessarily worse cough at night. She was treated with flonase for yrs -- not really sure if cough got better. Was also treated for GERD with PPI for yrs -- not really better.   She left [facility] [place] in Oct 2016 and drove cross country, going to [place] and [place] then finally making it here in [place] in Dec 2016. She has had cats forever, even before the cough.  She is now staying with her sister. House is old, relatively clean house, has carpets. (-) Molds.  Cough is NOT worse here [facility] to [facility], not worse at her current house [place] [facility] [place].   Saw ENT and was given neurontin  100 mg TID.  Neurontin made  her really sleepy.   She was in a lot of stress at work last year but it is better now.  Cough was not worse with stress.   Non smoker. Denies asthma or copd.   ROV 11/29/2015  Patient returns to the office as follow-up on her cough. Since seeing her in May, she has seen Dr. Sherene SiresWert a couple of times. Her Nitric oxide test was negative. She was placed on prednisone taper which made the cough go away.  She was tried on Symbicort which did not help the cough. Symbicort made her nauseated. She Is back with her  baseline cough although HA is gone.   ROV  02/05/16  Pt is back for f/u on her cough.  Saw Allergy MD and was told was (-) for allergies. Neuro is seing  her. No f/u with GI.   ROV 04/02/15 Pt is here for f/u on her cough.  Cough is 90% better.  Not an issue now. Since last seen, allergy MD switched flonase to qnasal.  Qnasl helped her significantly. She uses kids benadryl 12.5 mg prn for cough. Takes zantac 300 mg daily. Takes protonix BID. Her main issue now is she feels like a "zombie". She is tired all the time, does not have energy. I have a sense this has been going on a while but she has not really noticed it because of the cough. Since the cough is better, it has become a main issue for her.  ROV 04/15/2016 patient returns to the office urgently as she is having a reaction to one of her medicines. She has been having nausea, poor by mouth intake, soft stools and diarrhea since December 2017. She started Protonix and ranitidine couple months before that. Denies bloody stools. She has anorexia and has lost weight because of this. On a different subject, her sleep is better as she has avoided  naps. She is scheduled to have a sleep study in March. Her cough remains stable as well.  ROV 07/31/2016 patient returns to the office as follow-up. When I saw her last, her cough was significantly improved. Today she tells me it's no longer an issue. At the end of the day, the cough improved with getting rid of chocolate and wine. It was most likely related to reflux. She also continues to stay healthy and has significantly lost weight. She had diarrhea at the start of the year which made her lose more weight. Diarrhea is better with getting rid of PPI. Occasional  Dyspnea. Sleep study was (-)  Review of Systems  Constitutional: Negative.  Negative for fever and unexpected weight change.       Has significantly lost weight.   HENT: Negative for congestion, dental problem, ear pain, nosebleeds, postnasal drip,  rhinorrhea, sinus pressure, sneezing, sore throat and trouble swallowing.   Eyes: Negative.  Negative for redness and itching.  Respiratory: Positive for cough. Negative for chest tightness, shortness of breath and wheezing.   Cardiovascular: Negative.  Negative for palpitations and leg swelling.  Gastrointestinal: Positive for diarrhea, nausea and vomiting.  Endocrine: Negative.   Genitourinary: Negative.  Negative for dysuria.  Musculoskeletal: Positive for arthralgias and joint swelling.  Skin: Negative.  Negative for rash.  Allergic/Immunologic: Negative.   Hematological: Negative.  Does not bruise/bleed easily.  Psychiatric/Behavioral: Negative.  Negative for dysphoric mood. The patient is not nervous/anxious.   All other systems reviewed and are negative.   Objective:   Physical Exam  Vitals:  Vitals:   07/31/16 1616  BP: 102/70  Pulse: 88  SpO2: 93%  Weight: 142 lb (64.4 kg)  Height: 5\' 2"  (1.575 m)    Constitutional/General:  Pleasant, well-nourished, well-developed, not in any distress,  Comfortably seating.  Well kempt  Body mass index is 25.97 kg/m. Wt Readings from Last 3 Encounters:  07/31/16 142 lb (64.4 kg)  07/03/16 144 lb (65.3 kg)  05/16/16 150 lb (68 kg)    HEENT: Pupils equal and reactive to light and accommodation. Anicteric sclerae. Normal nasal mucosa.   No oral  lesions,  mouth clear,  oropharynx clear, no postnasal drip. (-) Oral thrush. No dental caries.  Airway - Mallampati class III  Neck: No masses. Midline trachea. No JVD, (-) LAD. (-) bruits appreciated.  Respiratory/Chest: Grossly normal chest. (-) deformity. (-) Accessory muscle use.  Symmetric expansion. (-) Tenderness on palpation.  Resonant on percussion.  Diminished BS on both lower lung zones. (-) wheezing, crackles, rhonchi (-) egophony  Cardiovascular: Regular rate and  rhythm, heart sounds normal, no murmur or gallops, no peripheral edema  Gastrointestinal:  Normal bowel  sounds. Soft, non-tender. No hepatosplenomegaly.  (-) masses.   Musculoskeletal:  Normal muscle tone. Normal gait.   Extremities: Grossly normal. (-) clubbing, cyanosis.  (-) edema  Skin: (-) rash,lesions seen.   Neurological/Psychiatric : alert, oriented to time, place, person. Normal mood and affect            Assessment & Plan:  Cough Pt is being seen for cough. Cough since 2009, not affected by environment. No meds implicated. [place]oved from [place] to [place], moved houses, cough still the same. Was in  a lot of stress with the move.  Has occasional sinus issues/throat clearing.  Drinking carbonated drinks maked  her cough better.  CXR 07/20/15 No abn FENO 08/24/15 = 13 PFT 11/29/15 No obstruction. No significant bronchodilator response. Normal diffusion capacity N Neck ct  scan 11/2015  Chronic maxillary sinusitis Chest ct scan 11/2015 2 mm RUL nodule RAST : cottonwood and oak were elevated.   Cough is now resolved.  I think ultimately it resolved with her getting rid of chocolates and wine. In retrospect, most likely related to reflux disease. She has significantly lost weight to the point that her reflux is better without meds.      Lung nodule Chest ct scan in 11/2015 : 2 mm right upper lung nodule which is probably normal. Nonsmoker. Chest x-ray today. Patient continues to lose weight, although intentional. Plan to get a chest CT scan in September 2018. If nodule looks the same, no further chest CT scan needed.      Return to clinic after chest ct scan is done.      Pollie Meyer, MD 07/31/2016   5:12 PM [facility] Care Medicine [facility] Pager: [phone] Office: [phone], Fax: [phone]

## 2016-07-31 NOTE — Patient Instructions (Signed)
  It was a pleasure taking care of you today!  We will get a chest x-ray today and call you with results.  We will schedule you for a follow-up chest CT scan and of September. We will call you once with results.  Follow-up after chest CT scan in September.

## 2016-07-31 NOTE — Assessment & Plan Note (Addendum)
Chest ct scan in 11/2015 : 2 mm right upper lung nodule which is probably normal. Nonsmoker. Chest x-ray today. Patient continues to lose weight, although intentional. Plan to get a chest CT scan in September 2018. If nodule looks the same, no further chest CT scan needed.

## 2016-07-31 NOTE — Assessment & Plan Note (Signed)
Pt is being seen for cough. Cough since 2009, not affected by environment. No meds implicated. [place]oved [place], moved houses, cough still the same. Was in  a lot of stress with the move.  Has occasional sinus issues/throat clearing.  Drinking carbonated drinks maked  her cough better.  CXR 07/20/15 No abn FENO 08/24/15 = 13 PFT 11/29/15 No obstruction. No significant bronchodilator response. Normal diffusion capacity N Neck ct scan 11/2015  Chronic maxillary sinusitis Chest ct scan 11/2015 2 mm RUL nodule RAST : cottonwood and oak were elevated.   Cough is now resolved.  I think ultimately it resolved with her getting rid of chocolates and wine. In retrospect, most likely related to reflux disease. She has significantly lost weight to the point that her reflux is better without meds.

## 2016-08-01 DIAGNOSIS — J301 Allergic rhinitis due to pollen: Secondary | ICD-10-CM | POA: Diagnosis not present

## 2016-08-01 DIAGNOSIS — H6983 Other specified disorders of Eustachian tube, bilateral: Secondary | ICD-10-CM | POA: Diagnosis not present

## 2016-08-06 DIAGNOSIS — F330 Major depressive disorder, recurrent, mild: Secondary | ICD-10-CM | POA: Diagnosis not present

## 2016-08-09 ENCOUNTER — Other Ambulatory Visit: Payer: Self-pay

## 2016-08-09 MED ORDER — MONTELUKAST SODIUM 10 MG PO TABS: 10.0000 mg | ORAL_TABLET | Freq: Every day | ORAL | 0 refills | Status: DC

## 2016-08-09 NOTE — Telephone Encounter (Signed)
Patient called in regards to a refill for Montelukast. She stated the pharmacy sent over a request. I did not see a request. I sent in 1 refill for montelukast with no extra refills. I informed patient that she needs an office visit and when she comes she can get more refills. Patient is setting up appointment.

## 2016-09-03 ENCOUNTER — Ambulatory Visit (INDEPENDENT_AMBULATORY_CARE_PROVIDER_SITE_OTHER): Payer: Medicare Other | Admitting: Allergy and Immunology

## 2016-09-03 ENCOUNTER — Encounter: Payer: Self-pay | Admitting: Allergy and Immunology

## 2016-09-03 VITALS — BP 102/60 | HR 88 | Resp 20 | Ht 62.0 in | Wt 145.4 lb

## 2016-09-03 DIAGNOSIS — J3089 Other allergic rhinitis: Secondary | ICD-10-CM | POA: Diagnosis not present

## 2016-09-03 DIAGNOSIS — K219 Gastro-esophageal reflux disease without esophagitis: Secondary | ICD-10-CM

## 2016-09-03 DIAGNOSIS — F332 Major depressive disorder, recurrent severe without psychotic features: Secondary | ICD-10-CM | POA: Diagnosis not present

## 2016-09-03 NOTE — Patient Instructions (Addendum)
  1. Continue to perform Allergen avoidance measures  2. Continue Flonase one-2 sprays each nostril 3-7 times per week  3. Discontinue montelukast   4. Continue to remain away from caffeine, chocolate, and alcohol consumption    5. Return to clinic in 1 year or earlier if problem

## 2016-09-03 NOTE — Progress Notes (Signed)
Follow-up Note  Referring Provider: Renford DillsPolite, Ronald, MD Primary Provider: Renford DillsPolite, Ronald, MD Date of Office Visit: 09/03/2016  Subjective:   Debbie FlakeKathryn Schultz (DOB: 03/27/1950) is a 66 y.o. female [facility] in re-evaluation of the following:  HPI: Debbie EvansCatherine returns to this clinic in reevaluation of her cough. I last saw her in this clinic on 01/16/2016 at which point in time we had her eliminate all caffeine and chocolate and alcohol consumption and utilize a collection medical therapy directed against respiratory tract inflammation and reflux.  She has resolved her cough. She has eliminated all caffeine and chocolate and alcohol consumption and is slowly taper off her therapy for reflux over the course of the past several months. She still continues to use Flonase intermittently for her nasal issue and has continued to use montelukast.  Allergies as of 09/03/2016      Reactions   Symbicort [budesonide-formoterol Fumarate] Nausea Only   Bupropion Rash   Sulfamethoxazole Rash      Medication List      desvenlafaxine 50 MG 24 hr tablet Commonly known as:  PRISTIQ Take 50 mg by mouth every morning.   fluticasone 50 MCG/ACT nasal spray Commonly known as:  FLONASE instill 1 spray into each nostril once daily   LIOTHYRONINE SODIUM PO Take 1 tablet by mouth 2 (two) times daily.   montelukast 10 MG tablet Commonly known as:  SINGULAIR Take 1 tablet (10 mg total) by mouth at bedtime.   multivitamin capsule Take 1 capsule by mouth daily.   naproxen 250 MG tablet Commonly known as:  NAPROSYN Take 250 mg by mouth 2 (two) times daily.   OMEGA-3 FATTY ACIDS PO Take by mouth.   propranolol 10 MG tablet Commonly known as:  INDERAL Take 10 mg by mouth 2 (two) times daily.   temazepam 15 MG capsule Commonly known as:  RESTORIL Take 15 mg by mouth at bedtime. Pt. Takes 30mg  at bedtime   traZODone 100 MG tablet Commonly known  as:  DESYREL Take 300 mg by mouth at bedtime.   Vitamin D3 10000 units Tabs Take by mouth.   ZINC PO Take by mouth.       Past Medical History:  Diagnosis Date  . Anxiety   . Depression   . Headache    resolved  . Hypothyroid   . Insomnia     Past Surgical History:  Procedure Laterality Date  . BLADDER SURGERY    . CATARACT EXTRACTION, BILATERAL    . KNEE ARTHROSCOPY    . REPLACEMENT TOTAL KNEE Right 2008  . REPLACEMENT TOTAL KNEE Left 2012  . TONSILLECTOMY      Review of systems negative except as noted in HPI / PMHx or noted below:  Review of Systems  Constitutional: Negative.   HENT: Negative.   Eyes: Negative.   Respiratory: Negative.   Cardiovascular: Negative.   Gastrointestinal: Negative.   Genitourinary: Negative.   Musculoskeletal: Negative.   Skin: Negative.   Neurological: Negative.   Endo/Heme/Allergies: Negative.   Psychiatric/Behavioral: Negative.      Objective:   Vitals:   09/03/16 1128  BP: 102/60  Pulse: 88  Resp: 20   Height: 5\' 2"  (157.5 cm)  Weight: 145 lb 6.4 oz (66 kg)   Physical Exam  Constitutional: She is well-developed, well-nourished, and in no distress.  HENT:  Head: Normocephalic.  Right Ear: Tympanic membrane, external ear and ear canal normal.  Left Ear: Tympanic  membrane, external ear and ear canal normal.  Nose: Nose normal. No mucosal edema or rhinorrhea.  Mouth/Throat: Uvula is midline, oropharynx is clear and moist and mucous membranes are normal. No oropharyngeal exudate.  Eyes: Conjunctivae are normal.  Neck: Trachea normal. No tracheal tenderness present. No tracheal deviation present. No thyromegaly present.  Cardiovascular: Normal rate, regular rhythm, S1 normal, S2 normal and normal heart sounds.   No murmur heard. Pulmonary/Chest: Breath sounds normal. No stridor. No respiratory distress. She has no wheezes. She has no rales.  Musculoskeletal: She exhibits no edema.  Lymphadenopathy:       Head  (right side): No tonsillar adenopathy present.       Head (left side): No tonsillar adenopathy present.    She has no cervical adenopathy.  Neurological: She is alert. Gait normal.  Skin: No rash noted. She is not diaphoretic. No erythema. Nails show no clubbing.  Psychiatric: Mood and affect normal.    Diagnostics: none  Assessment and Plan:   1. Other allergic rhinitis   2. LPRD (laryngopharyngeal reflux disease)     1. Continue to perform Allergen avoidance measures  2. Continue Flonase one-2 sprays each nostril 3-7 times per week  3. Discontinue montelukast   4. Continue to remain away from caffeine, chocolate, and alcohol consumption    5. Return to clinic in 1 year or earlier if problem  Debbie Schultz has resolved her cough of greater than 10 years duration and presently she is only using dietary manipulation with elimination of all caffeine and chocolate and alcohol consumption as her major controller agent for this issue. She does have an issue with allergic rhinoconjunctivitis that also appears to be under good control with Flonase and I have asked her to discontinue her montelukast as this may not be required at this point in time. I will see her back in this clinic in 1 year or earlier if there is a problem. I did remind her to obtain a flu vaccine this fall.  Debbie Schimke, MD Allergy / Immunology [facility]

## 2016-09-05 ENCOUNTER — Telehealth: Payer: Self-pay | Admitting: Adult Health

## 2016-09-05 DIAGNOSIS — R911 Solitary pulmonary nodule: Secondary | ICD-10-CM

## 2016-09-05 NOTE — Telephone Encounter (Signed)
New order placed under TP as patient will follow up with her in 11-2016 after CT chest. Misty StanleyStacey is aware of change to order. Nothing more needed at this time.

## 2016-10-28 ENCOUNTER — Telehealth: Payer: Self-pay | Admitting: *Deleted

## 2016-10-28 NOTE — Telephone Encounter (Signed)
This has already been taken care of Will sign off

## 2016-10-28 NOTE — Telephone Encounter (Signed)
-----   Message from [name] sent at 10/22/2016 11:27 AM EDT ----- This pt's ct needs to be changed from dedios to tammy p because she is coming to see TP 9/13 ct is scheduled for 9/11

## 2016-11-05 DIAGNOSIS — F332 Major depressive disorder, recurrent severe without psychotic features: Secondary | ICD-10-CM | POA: Diagnosis not present

## 2016-11-19 ENCOUNTER — Other Ambulatory Visit: Payer: Medicare Other

## 2016-11-19 ENCOUNTER — Inpatient Hospital Stay: Admission: RE | Admit: 2016-11-19 | Payer: Medicare Other | Source: Ambulatory Visit

## 2016-11-21 ENCOUNTER — Ambulatory Visit: Payer: Medicare Other | Admitting: Adult Health

## 2017-02-25 DIAGNOSIS — F332 Major depressive disorder, recurrent severe without psychotic features: Secondary | ICD-10-CM | POA: Diagnosis not present

## 2017-02-27 DIAGNOSIS — M67432 Ganglion, left wrist: Secondary | ICD-10-CM | POA: Diagnosis not present

## 2017-02-27 DIAGNOSIS — Z23 Encounter for immunization: Secondary | ICD-10-CM | POA: Diagnosis not present

## 2017-02-27 DIAGNOSIS — E039 Hypothyroidism, unspecified: Secondary | ICD-10-CM | POA: Diagnosis not present

## 2017-02-28 DIAGNOSIS — M67432 Ganglion, left wrist: Secondary | ICD-10-CM | POA: Diagnosis not present

## 2017-03-05 DIAGNOSIS — R229 Localized swelling, mass and lump, unspecified: Secondary | ICD-10-CM

## 2017-03-05 DIAGNOSIS — M795 Residual foreign body in soft tissue: Secondary | ICD-10-CM | POA: Diagnosis not present

## 2017-03-05 DIAGNOSIS — IMO0002 Reserved for concepts with insufficient information to code with codable children: Secondary | ICD-10-CM | POA: Insufficient documentation

## 2017-03-12 DIAGNOSIS — R2231 Localized swelling, mass and lump, right upper limb: Secondary | ICD-10-CM | POA: Diagnosis not present

## 2017-03-12 DIAGNOSIS — M7989 Other specified soft tissue disorders: Secondary | ICD-10-CM | POA: Diagnosis not present

## 2017-03-12 DIAGNOSIS — M795 Residual foreign body in soft tissue: Secondary | ICD-10-CM | POA: Diagnosis not present

## 2017-03-14 ENCOUNTER — Other Ambulatory Visit: Payer: Self-pay | Admitting: Orthopedic Surgery

## 2017-03-14 DIAGNOSIS — R229 Localized swelling, mass and lump, unspecified: Secondary | ICD-10-CM | POA: Diagnosis not present

## 2017-03-18 ENCOUNTER — Other Ambulatory Visit: Payer: Self-pay

## 2017-03-18 ENCOUNTER — Ambulatory Visit (HOSPITAL_BASED_OUTPATIENT_CLINIC_OR_DEPARTMENT_OTHER)
Admission: RE | Admit: 2017-03-18 | Discharge: 2017-03-18 | Disposition: A | Discharge status: Home / Self Care | Payer: Medicare Other | Source: Ambulatory Visit | Attending: Orthopedic Surgery | Admitting: Orthopedic Surgery

## 2017-03-18 ENCOUNTER — Ambulatory Visit (HOSPITAL_BASED_OUTPATIENT_CLINIC_OR_DEPARTMENT_OTHER): Payer: Medicare Other | Admitting: Anesthesiology

## 2017-03-18 ENCOUNTER — Encounter (HOSPITAL_BASED_OUTPATIENT_CLINIC_OR_DEPARTMENT_OTHER)
Admission: RE | Disposition: A | Discharge status: Home / Self Care | Payer: Self-pay | Source: Ambulatory Visit | Attending: Orthopedic Surgery

## 2017-03-18 ENCOUNTER — Encounter (HOSPITAL_BASED_OUTPATIENT_CLINIC_OR_DEPARTMENT_OTHER): Payer: Self-pay | Admitting: *Deleted

## 2017-03-18 DIAGNOSIS — G4700 Insomnia, unspecified: Secondary | ICD-10-CM | POA: Diagnosis not present

## 2017-03-18 DIAGNOSIS — M67432 Ganglion, left wrist: Secondary | ICD-10-CM | POA: Insufficient documentation

## 2017-03-18 DIAGNOSIS — F329 Major depressive disorder, single episode, unspecified: Secondary | ICD-10-CM | POA: Diagnosis not present

## 2017-03-18 DIAGNOSIS — M71332 Other bursal cyst, left wrist: Secondary | ICD-10-CM | POA: Insufficient documentation

## 2017-03-18 DIAGNOSIS — Z79899 Other long term (current) drug therapy: Secondary | ICD-10-CM | POA: Diagnosis not present

## 2017-03-18 DIAGNOSIS — E039 Hypothyroidism, unspecified: Secondary | ICD-10-CM | POA: Diagnosis not present

## 2017-03-18 DIAGNOSIS — F419 Anxiety disorder, unspecified: Secondary | ICD-10-CM | POA: Insufficient documentation

## 2017-03-18 DIAGNOSIS — K219 Gastro-esophageal reflux disease without esophagitis: Secondary | ICD-10-CM | POA: Diagnosis not present

## 2017-03-18 DIAGNOSIS — R197 Diarrhea, unspecified: Secondary | ICD-10-CM | POA: Diagnosis not present

## 2017-03-18 HISTORY — DX: Cardiac arrhythmia, unspecified: I49.9

## 2017-03-18 HISTORY — PX: CYST EXCISION: SHX5701

## 2017-03-18 HISTORY — DX: Unspecified osteoarthritis, unspecified site: M19.90

## 2017-03-18 SURGERY — CYST REMOVAL
Anesthesia: Regional | Site: Wrist | Laterality: Left

## 2017-03-18 MED ORDER — BUPIVACAINE HCL (PF) 0.25 % IJ SOLN
INTRAMUSCULAR | Status: DC | PRN
  Administered 2017-03-18: 5 mL

## 2017-03-18 MED ORDER — MIDAZOLAM HCL 2 MG/2ML IJ SOLN
INTRAMUSCULAR | Status: AC
  Filled 2017-03-18: qty 2

## 2017-03-18 MED ORDER — ONDANSETRON HCL 4 MG/2ML IJ SOLN
INTRAMUSCULAR | Status: AC
Start: 2017-03-18 — End: 2017-03-18
  Filled 2017-03-18: qty 2

## 2017-03-18 MED ORDER — FENTANYL CITRATE (PF) 100 MCG/2ML IJ SOLN: 25.0000 ug | INTRAMUSCULAR | Status: DC | PRN

## 2017-03-18 MED ORDER — LACTATED RINGERS IV SOLN
INTRAVENOUS | Status: DC
  Administered 2017-03-18: 12:00:00 via INTRAVENOUS

## 2017-03-18 MED ORDER — METOCLOPRAMIDE HCL 5 MG/ML IJ SOLN: 10.0000 mg | Freq: Once | INTRAMUSCULAR | Status: DC | PRN

## 2017-03-18 MED ORDER — LIDOCAINE HCL (PF) 0.5 % IJ SOLN
INTRAMUSCULAR | Status: DC | PRN
  Administered 2017-03-18: 50 mL via INTRAVENOUS

## 2017-03-18 MED ORDER — MEPERIDINE HCL 25 MG/ML IJ SOLN: 6.2500 mg | INTRAMUSCULAR | Status: DC | PRN

## 2017-03-18 MED ORDER — LIDOCAINE 2% (20 MG/ML) 5 ML SYRINGE
INTRAMUSCULAR | Status: AC
  Filled 2017-03-18: qty 5

## 2017-03-18 MED ORDER — MIDAZOLAM HCL 2 MG/2ML IJ SOLN
1.0000 mg | INTRAMUSCULAR | Status: DC | PRN
  Administered 2017-03-18: 1 mg via INTRAVENOUS

## 2017-03-18 MED ORDER — BUPIVACAINE HCL (PF) 0.25 % IJ SOLN
INTRAMUSCULAR | Status: AC
  Filled 2017-03-18: qty 30

## 2017-03-18 MED ORDER — FENTANYL CITRATE (PF) 100 MCG/2ML IJ SOLN
INTRAMUSCULAR | Status: AC
  Filled 2017-03-18: qty 2

## 2017-03-18 MED ORDER — CEFAZOLIN SODIUM-DEXTROSE 2-4 GM/100ML-% IV SOLN
2.0000 g | INTRAVENOUS | Status: AC
  Administered 2017-03-18: 2 g via INTRAVENOUS

## 2017-03-18 MED ORDER — CEFAZOLIN SODIUM-DEXTROSE 2-4 GM/100ML-% IV SOLN
INTRAVENOUS | Status: AC
Start: 2017-03-18 — End: 2017-03-18
  Filled 2017-03-18: qty 100

## 2017-03-18 MED ORDER — ONDANSETRON HCL 4 MG/2ML IJ SOLN
INTRAMUSCULAR | Status: DC | PRN
  Administered 2017-03-18: 4 mg via INTRAVENOUS

## 2017-03-18 MED ORDER — FENTANYL CITRATE (PF) 100 MCG/2ML IJ SOLN
50.0000 ug | INTRAMUSCULAR | Status: DC | PRN
  Administered 2017-03-18: 50 ug via INTRAVENOUS

## 2017-03-18 MED ORDER — SCOPOLAMINE 1 MG/3DAYS TD PT72: 1.0000 | MEDICATED_PATCH | Freq: Once | TRANSDERMAL | Status: DC | PRN

## 2017-03-18 MED ORDER — PROPOFOL 500 MG/50ML IV EMUL
INTRAVENOUS | Status: DC | PRN
  Administered 2017-03-18: 50 ug/kg/min via INTRAVENOUS

## 2017-03-18 MED ORDER — CHLORHEXIDINE GLUCONATE 4 % EX LIQD: 60.0000 mL | Freq: Once | CUTANEOUS | Status: DC

## 2017-03-18 MED ORDER — HYDROCODONE-ACETAMINOPHEN 5-325 MG PO TABS: 1.0000 | ORAL_TABLET | Freq: Four times a day (QID) | ORAL | 0 refills | Status: DC | PRN

## 2017-03-18 MED ORDER — LACTATED RINGERS IV SOLN: INTRAVENOUS | Status: DC

## 2017-03-18 SURGICAL SUPPLY — 46 items
BANDAGE COBAN STERILE 2 (GAUZE/BANDAGES/DRESSINGS) IMPLANT
BLADE MINI RND TIP GREEN BEAV (BLADE) IMPLANT
BLADE SURG 15 STRL LF DISP TIS (BLADE) ×1 IMPLANT
BLADE SURG 15 STRL SS (BLADE) ×1
BNDG COHESIVE 1X5 TAN STRL LF (GAUZE/BANDAGES/DRESSINGS) IMPLANT
BNDG COHESIVE 3X5 TAN STRL LF (GAUZE/BANDAGES/DRESSINGS) ×2 IMPLANT
BNDG ESMARK 4X9 LF (GAUZE/BANDAGES/DRESSINGS) ×2 IMPLANT
BNDG GAUZE ELAST 4 BULKY (GAUZE/BANDAGES/DRESSINGS) ×2 IMPLANT
CHLORAPREP W/TINT 26ML (MISCELLANEOUS) ×2 IMPLANT
CORD BIPOLAR FORCEPS 12FT (ELECTRODE) ×2 IMPLANT
COVER BACK TABLE 60X90IN (DRAPES) ×2 IMPLANT
COVER MAYO STAND STRL (DRAPES) ×2 IMPLANT
CUFF TOURNIQUET SINGLE 18IN (TOURNIQUET CUFF) ×2 IMPLANT
DECANTER SPIKE VIAL GLASS SM (MISCELLANEOUS) IMPLANT
DRAIN PENROSE 1/2X12 LTX STRL (WOUND CARE) IMPLANT
DRAPE EXTREMITY T 121X128X90 (DRAPE) ×2 IMPLANT
DRAPE SURG 17X23 STRL (DRAPES) ×2 IMPLANT
GAUZE SPONGE 4X4 12PLY STRL (GAUZE/BANDAGES/DRESSINGS) ×2 IMPLANT
GAUZE XEROFORM 1X8 LF (GAUZE/BANDAGES/DRESSINGS) ×2 IMPLANT
GLOVE BIO SURGEON STRL SZ8 (GLOVE) ×2 IMPLANT
GLOVE BIOGEL PI IND STRL 8 (GLOVE) ×1 IMPLANT
GLOVE BIOGEL PI IND STRL 8.5 (GLOVE) ×1 IMPLANT
GLOVE BIOGEL PI INDICATOR 8 (GLOVE) ×1
GLOVE BIOGEL PI INDICATOR 8.5 (GLOVE) ×1
GLOVE SURG ORTHO 8.0 STRL STRW (GLOVE) ×2 IMPLANT
GOWN STRL REUS W/ TWL LRG LVL3 (GOWN DISPOSABLE) ×1 IMPLANT
GOWN STRL REUS W/TWL LRG LVL3 (GOWN DISPOSABLE) ×1
GOWN STRL REUS W/TWL XL LVL3 (GOWN DISPOSABLE) ×2 IMPLANT
NEEDLE PRECISIONGLIDE 27X1.5 (NEEDLE) IMPLANT
NS IRRIG 1000ML POUR BTL (IV SOLUTION) ×2 IMPLANT
PACK BASIN DAY SURGERY FS (CUSTOM PROCEDURE TRAY) ×2 IMPLANT
PAD CAST 3X4 CTTN HI CHSV (CAST SUPPLIES) ×1 IMPLANT
PADDING CAST ABS 3INX4YD NS (CAST SUPPLIES)
PADDING CAST ABS 4INX4YD NS (CAST SUPPLIES) ×1
PADDING CAST ABS COTTON 3X4 (CAST SUPPLIES) IMPLANT
PADDING CAST ABS COTTON 4X4 ST (CAST SUPPLIES) ×1 IMPLANT
PADDING CAST COTTON 3X4 STRL (CAST SUPPLIES) ×1
SPLINT PLASTER CAST XFAST 3X15 (CAST SUPPLIES) ×5 IMPLANT
SPLINT PLASTER XTRA FASTSET 3X (CAST SUPPLIES) ×5
STOCKINETTE 4X48 STRL (DRAPES) ×2 IMPLANT
SUT ETHILON 4 0 PS 2 18 (SUTURE) ×2 IMPLANT
SUT VIC AB 4-0 P2 18 (SUTURE) IMPLANT
SYR BULB 3OZ (MISCELLANEOUS) ×2 IMPLANT
SYR CONTROL 10ML LL (SYRINGE) ×2 IMPLANT
TOWEL OR 17X24 6PK STRL BLUE (TOWEL DISPOSABLE) ×4 IMPLANT
UNDERPAD 30X30 (UNDERPADS AND DIAPERS) ×2 IMPLANT

## 2017-03-18 NOTE — Anesthesia Preprocedure Evaluation (Signed)
Anesthesia Evaluation  Patient identified by MRN, date of birth, ID band Patient awake    Reviewed: Allergy & Precautions, NPO status , Patient's Chart, lab work & pertinent test results  Airway Mallampati: II  TM Distance: >3 FB Neck ROM: Full    Dental no notable dental hx.    Pulmonary neg pulmonary ROS,    Pulmonary exam normal breath sounds clear to auscultation       Cardiovascular negative cardio ROS Normal cardiovascular exam Rhythm:Regular Rate:Normal     Neuro/Psych negative neurological ROS  negative psych ROS   GI/Hepatic negative GI ROS, Neg liver ROS,   Endo/Other  negative endocrine ROSHypothyroidism   Renal/GU negative Renal ROS  negative genitourinary   Musculoskeletal negative musculoskeletal ROS (+)   Abdominal   Peds negative pediatric ROS (+)  Hematology negative hematology ROS (+)   Anesthesia Other Findings   Reproductive/Obstetrics negative OB ROS                             Anesthesia Physical Anesthesia Plan  ASA: II  Anesthesia Plan: Bier Block and Bier Block-LIDOCAINE ONLY   Post-op Pain Management:    Induction: Intravenous  PONV Risk Score and Plan: 2 and Ondansetron, Dexamethasone and Treatment may vary due to age or medical condition  Airway Management Planned: Simple Face Mask  Additional Equipment:   Intra-op Plan:   Post-operative Plan:   Informed Consent: I have reviewed the patients History and Physical, chart, labs and discussed the procedure including the risks, benefits and alternatives for the proposed anesthesia with the patient or authorized representative who has indicated his/her understanding and acceptance.   Dental advisory given  Plan Discussed with: CRNA  Anesthesia Plan Comments:         Anesthesia Quick Evaluation

## 2017-03-18 NOTE — H&P (Signed)
Debbie Schultz is an 67 y.o. female.   Chief Complaint: mass left wrist HPI: Debbie Schultz is a 67 year old right-hand-dominant female referred to Dr. Farris Has for mass on the volar radial aspect of the left wrist. This been present for several months. She has had a smaller mass for a longer period of time. She recalls no history of injury. She states it only causes pain if she hits the area. She states it is annoying when she is working on a keyboard and that rubs the area. She is not complaining of any numbness or tingling. She also has a questionable splinter in her right index finger. This on the radial aspect of the PIP joint which has been present since November. Though there is something there. Not causing any numbness tingling or pain. He has no history of diabetes or gout. She has a history of arthritis and thyroid problems. Family history is positive arthritis negative for diabetes thyroid problems and gout. He has not taken anything for this. She states nothing makes it better or worse.She was referred for ultrasound to rule out a possible foreign body on her opposite hand and to determine the position of the cyst as compared to the vascular structures on her left volar wrist. The cyst itself has been present for months.           Past Medical History:  Diagnosis Date  . Anxiety   . Depression   . Headache    resolved  . Hypothyroid   . Insomnia     Past Surgical History:  Procedure Laterality Date  . BLADDER SURGERY    . CATARACT EXTRACTION, BILATERAL    . KNEE ARTHROSCOPY    . REPLACEMENT TOTAL KNEE Right 2008  . REPLACEMENT TOTAL KNEE Left 2012  . TONSILLECTOMY      Family History  Problem Relation Age of Onset  . Heart disease Mother        PPM  . Heart attack Father   . Diabetes Maternal Aunt   . Stroke Paternal Grandmother   . Headache Neg Hx    Social History:  reports that  has never smoked. she has never used smokeless tobacco. She reports that she drinks  alcohol. She reports that she does not use drugs.  Allergies:  Allergies  Allergen Reactions  . Symbicort [Budesonide-Formoterol Fumarate] Nausea Only  . Bupropion Rash  . Sulfamethoxazole Rash    No medications prior to admission.    No results found for this or any previous visit (from the past 48 hour(s)).  No results found.   Pertinent items are noted in HPI.  There were no vitals taken for this visit.  General appearance: alert, cooperative and appears stated age Head: Normocephalic, without obvious abnormality Neck: no JVD Resp: clear to auscultation bilaterally Cardio: regular rate and rhythm, S1, S2 normal, no murmur, click, rub or gallop GI: soft, non-tender; bowel sounds normal; no masses,  no organomegaly Extremities: mass left wrist Pulses: 2+ and symmetric Skin: Skin color, texture, turgor normal. No rashes or lesions Neurologic: Grossly normal Incision/Wound: na  Assessment/Plan Assessment:  1. Mass    Plan: She would like to have this removed and that it is annoying when she rests her hand flat. We have discussed pre-peri-and postoperative course along with risks and complications. She is aware that there is no guarantee to the surgery the possibility of infection recurrence injury to arteries nerves tendons incomplete relief of symptoms and dystrophy. She is scheduled for excision of volar radial  wrist ganglion left wrist and outpatient under regional anesthesia. Questions are encouraged and answered to her satisfaction. He is aware that there is a 20% recurrence rate on volar radial wrist ganglions.      Edin Kon R 03/18/2017, 9:33 AM

## 2017-03-18 NOTE — Op Note (Signed)
Dictation Number [id number]

## 2017-03-18 NOTE — Op Note (Signed)
NAMElla Bodo:  Levitz, Debbie Schultz           ACCOUNT NO.:  [id number]  MEDICAL RECORD NO.:  [id number]  LOCATION:                                 FACILITY:  PHYSICIAN:  Cindee SaltGary Sherry Blackard, M.D.            DATE OF BIRTH:  DATE OF PROCEDURE:  03/18/2017 DATE OF DISCHARGE:                              OPERATIVE REPORT   PREOPERATIVE DIAGNOSIS:  Volar radial wrist ganglion, left wrist.  POSTOPERATIVE DIAGNOSIS:  Volar radial wrist ganglion, left wrist.  OPERATION:  Excision volar radial wrist ganglion, left wrist.  SURGEON:  Cindee SaltGary Keari Miu, M.D.  ASSISTANT:  None.  ANESTHESIA:  Upper arm IV regional with local infiltration, IV sedation.  [facility]  ANESTHESIOLOGIST:  Zhan GroutPeter Carignan, MD.  HISTORY:  The patient is a 67 year old female with a history of multilobulated mass on the volar radial aspect of her left wrist.  She is desirous having this excised.  Pre, peri, and postoperative course have been discussed along with risks and complications.  She is aware there is no guarantee to the surgery; the possibility of infection; recurrence of injury to arteries, nerves, tendons; incomplete relief of symptoms; dystrophy.  In the preoperative area, the patient was seen, the extremity marked by both the patient and surgeon, antibiotic given.  DESCRIPTION OF PROCEDURE:  The patient was brought to the operating room where an upper arm IV regional anesthetic was carried out without difficulty under the direction of the Anesthesia Department.  She was prepped using ChloraPrep in a supine position with left arm free.  A 3- minute dry time was allowed.  Time-out taken, confirming the patient and procedure.  A longitudinal incision was made over the volar radial left wrist, carried down through subcutaneous tissue.  A multilobulated cyst was immediately encountered along flexor carpi radialis tendon and radial artery.  The superficial branch of the radial artery was identified.   Vena comitans and veins were coagulated as necessary. Artery was left intact over its entire course.  The cyst was identified. This was followed proximally and the stalk followed down into the radiocarpal joint, which was opened.  The area was debrided.  The cyst was sent to Pathology.  The wound was copiously irrigated with saline. The capsule was then closed with figure-of-eight 4-0 Vicryl sutures. The subcutaneous tissue was closed with interrupted 4-0 Vicryl and the skin with interrupted 4-0 nylon sutures.  A local infiltration with 0.25% bupivacaine without epinephrine was given, approximately 8 mL was used.  A sterile compressive dressing and volar splint were applied.  On deflation of the tourniquet, all fingers immediately pinked.  She was taken to the recovery room for observation in satisfactory condition.  [facility] be [facility] [place].          ______________________________ Cindee SaltGary Lorraina Spring, M.D.     GK/MEDQ  D:  03/18/2017  T:  03/18/2017  Job:  [id number]

## 2017-03-18 NOTE — Discharge Instructions (Addendum)

## 2017-03-18 NOTE — Brief Op Note (Signed)
03/18/2017  12:56 PM  PATIENT:  Federico FlakeKathryn Whitt  67 y.o. female  PRE-OPERATIVE DIAGNOSIS:  VOLAR GANGLION CYST LEFT WRIST  POST-OPERATIVE DIAGNOSIS:  VOLAR GANGLION CYST LEFT WRIST  PROCEDURE:  Procedure(s): EXCISION OF VOLAR CYST OF LEFT WRIST (Left)  SURGEON:  Surgeon(s) and Role:    Cindee Salt* Yaslene Lindamood, MD - Primary  PHYSICIAN ASSISTANT:   ASSISTANTS: none   ANESTHESIA:   regional and IV sedation local  EBL:  2ml  BLOOD ADMINISTERED:none  DRAINS: none   LOCAL MEDICATIONS USED:  BUPIVICAINE   SPECIMEN:  Excision  DISPOSITION OF SPECIMEN:  PATHOLOGY  COUNTS:  YES  TOURNIQUET:   Total Tourniquet Time Documented: Upper Arm (Left) - 39 minutes Total: Upper Arm (Left) - 39 minutes   DICTATION: .Other Dictation: Dictation Number [id number]  PLAN OF CARE: Discharge to home after PACU  PATIENT DISPOSITION:  PACU - hemodynamically stable.

## 2017-03-18 NOTE — Transfer of Care (Signed)
Immediate Anesthesia Transfer of Care Note  Patient: Debbie FlakeKathryn Schultz  Procedure(s) Performed: EXCISION OF VOLAR CYST OF LEFT WRIST (Left Wrist)  Patient Location: PACU  Anesthesia Type:Bier block  Level of Consciousness: awake, alert  and oriented  Airway & Oxygen Therapy: Patient Spontanous Breathing and Patient connected to face mask oxygen  Post-op Assessment: Report given to RN and Post -op Vital signs reviewed and stable  Post vital signs: Reviewed and stable  Last Vitals:  Vitals:   03/18/17 1131  BP: 113/71  Pulse: 69  Resp: 16  Temp: 36.8 C  SpO2: 96%    Last Pain:  Vitals:   03/18/17 1131  TempSrc: Oral      Patients Stated Pain Goal: 0 (03/18/17 1131)  Complications: No apparent anesthesia complications

## 2017-03-19 NOTE — Addendum Note (Signed)
Addendum  created 03/19/17 1010 by Lance CoonWebster, [place], CRNA   Charge Capture section accepted

## 2017-03-19 NOTE — Anesthesia Postprocedure Evaluation (Signed)
Anesthesia Post Note  Patient: Federico FlakeKathryn Nordby  Procedure(s) Performed: EXCISION OF VOLAR CYST OF LEFT WRIST (Left Wrist)     Patient location during evaluation: PACU Anesthesia Type: Bier Block Level of consciousness: awake and alert Pain management: pain level controlled Vital Signs Assessment: post-procedure vital signs reviewed and stable Respiratory status: spontaneous breathing, nonlabored ventilation, respiratory function stable and patient connected to nasal cannula oxygen Cardiovascular status: stable and blood pressure returned to baseline Postop Assessment: no apparent nausea or vomiting Anesthetic complications: no    Last Vitals:  Vitals:   03/18/17 1330 03/18/17 1400  BP: 104/72 119/73  Pulse: 66 67  Resp: 11 18  Temp:  (!) 36.4 C  SpO2: 96% 97%    Last Pain:  Vitals:   03/18/17 1400  TempSrc:   PainSc: 0-No pain                 Southard Groutarignan, Mavin Dyke

## 2017-03-20 ENCOUNTER — Encounter (HOSPITAL_BASED_OUTPATIENT_CLINIC_OR_DEPARTMENT_OTHER): Payer: Self-pay | Admitting: Orthopedic Surgery

## 2017-03-25 DIAGNOSIS — F418 Other specified anxiety disorders: Secondary | ICD-10-CM | POA: Diagnosis not present

## 2017-03-25 DIAGNOSIS — E039 Hypothyroidism, unspecified: Secondary | ICD-10-CM | POA: Diagnosis not present

## 2017-03-25 DIAGNOSIS — Z1389 Encounter for screening for other disorder: Secondary | ICD-10-CM | POA: Diagnosis not present

## 2017-03-25 DIAGNOSIS — Z0000 Encounter for general adult medical examination without abnormal findings: Secondary | ICD-10-CM | POA: Diagnosis not present

## 2017-03-26 DIAGNOSIS — R739 Hyperglycemia, unspecified: Secondary | ICD-10-CM | POA: Diagnosis not present

## 2017-03-26 DIAGNOSIS — G894 Chronic pain syndrome: Secondary | ICD-10-CM | POA: Diagnosis not present

## 2017-04-24 ENCOUNTER — Other Ambulatory Visit (HOSPITAL_COMMUNITY)
Admission: RE | Admit: 2017-04-24 | Discharge: 2017-04-24 | Disposition: A | Discharge status: Home / Self Care | Payer: Medicare Other | Source: Ambulatory Visit | Attending: Nurse Practitioner | Admitting: Nurse Practitioner

## 2017-04-24 ENCOUNTER — Other Ambulatory Visit: Payer: Self-pay | Admitting: Nurse Practitioner

## 2017-04-24 DIAGNOSIS — M8588 Other specified disorders of bone density and structure, other site: Secondary | ICD-10-CM | POA: Diagnosis not present

## 2017-04-24 DIAGNOSIS — Z01419 Encounter for gynecological examination (general) (routine) without abnormal findings: Secondary | ICD-10-CM | POA: Diagnosis not present

## 2017-04-25 LAB — CYTOLOGY - PAP
DIAGNOSIS: NEGATIVE
HPV: NOT DETECTED

## 2017-06-17 DIAGNOSIS — F332 Major depressive disorder, recurrent severe without psychotic features: Secondary | ICD-10-CM | POA: Diagnosis not present

## 2017-06-19 ENCOUNTER — Ambulatory Visit (INDEPENDENT_AMBULATORY_CARE_PROVIDER_SITE_OTHER): Payer: Medicare Other

## 2017-06-19 ENCOUNTER — Ambulatory Visit: Payer: Medicare Other | Admitting: Podiatry

## 2017-06-19 DIAGNOSIS — M2011 Hallux valgus (acquired), right foot: Secondary | ICD-10-CM

## 2017-06-19 DIAGNOSIS — M21611 Bunion of right foot: Secondary | ICD-10-CM

## 2017-06-19 NOTE — Patient Instructions (Signed)
Pre-Operative Instructions  Congratulations, you have decided to take an important step towards improving your quality of life.  You can be assured that the doctors and staff at [facility] will be with you every step of the way.  Here are some important things you should know:  1. Plan to be at the surgery center/hospital at least 1 (one) hour prior to your scheduled time, unless otherwise directed by the surgical center/hospital staff.  You must have a responsible adult accompany you, remain during the surgery and drive you home.  Make sure you have directions to the surgical center/hospital to ensure you arrive on time. 2. If you are having surgery at [facility] or [facility] hospitals, you will need a copy of your medical history and physical form from your family physician within one month prior to the date of surgery. We will give you a form for your primary physician to complete.  3. We make every effort to accommodate the date you request for surgery.  However, there are times where surgery dates or times have to be moved.  We will contact you as soon as possible if a change in schedule is required.   4. No aspirin/ibuprofen for one week before surgery.  If you are on aspirin, any non-steroidal anti-inflammatory medications (Mobic, Aleve, Ibuprofen) should not be taken seven (7) days prior to your surgery.  You make take Tylenol for pain prior to surgery.  5. Medications - If you are taking daily heart and blood pressure medications, seizure, reflux, allergy, asthma, anxiety, pain or diabetes medications, make sure you notify the surgery center/hospital before the day of surgery so they can tell you which medications you should take or avoid the day of surgery. 6. No food or drink after midnight the night before surgery unless directed otherwise by surgical center/hospital staff. 7. No alcoholic beverages 24-hours prior to surgery.  No smoking 24-hours prior or 24-hours after  surgery. 8. Wear loose pants or shorts. They should be loose enough to fit over bandages, boots, and casts. 9. Don't wear slip-on shoes. Sneakers are preferred. 10. Bring your boot with you to the surgery center/hospital.  Also bring crutches or a walker if your physician has prescribed it for you.  If you do not have this equipment, it will be provided for you after surgery. 11. If you have not been contacted by the surgery center/hospital by the day before your surgery, call to confirm the date and time of your surgery. 12. Leave-time from work may vary depending on the type of surgery you have.  Appropriate arrangements should be made prior to surgery with your employer. 13. Prescriptions will be provided immediately following surgery by your doctor.  Fill these as soon as possible after surgery and take the medication as directed. Pain medications will not be refilled on weekends and must be approved by the doctor. 14. Remove nail polish on the operative foot and avoid getting pedicures prior to surgery. 15. Wash the night before surgery.  The night before surgery wash the foot and leg well with water and the antibacterial soap provided. Be sure to pay special attention to beneath the toenails and in between the toes.  Wash for at least three (3) minutes. Rinse thoroughly with water and dry well with a towel.  Perform this wash unless told not to do so by your physician.  Enclosed: 1 Ice pack (please put in freezer the night before surgery)   1 Hibiclens skin cleaner     Pre-op instructions  If you have any questions regarding the instructions, please do not hesitate to call our office.  [place]: [address], , [place] [address] -- [phone]  [place]: [address] [place], [place] [address] -- [phone]  [place]: [address]  [place], [place] [address] -- [phone]  [place]: [address], [address], [place],  [address] -- [phone]  Website:  [email or web address] 

## 2017-07-08 NOTE — Progress Notes (Signed)
Subjective:  Patient ID: Debbie Schultz, female    DOB: February 04, 1951,  MRN: [id number]  Chief Complaint  Patient presents with  . Bunions    right foot, painful at times   67 y.o. female presents with the above complaint.  Reports right foot painful bunion deformity.  States that she has pain with all shoe gear.  Denies prior treatments.  Past Medical History:  Diagnosis Date  . Anxiety   . Arthritis    bilateral knees  . Depression   . Dysrhythmia    "high heart rate".  seen last Dec 2018  . Headache    resolved  . Hypothyroid   . Insomnia    Past Surgical History:  Procedure Laterality Date  . BLADDER SURGERY    . CATARACT EXTRACTION, BILATERAL    . CYST EXCISION Left 03/18/2017   Procedure: EXCISION OF VOLAR CYST OF LEFT WRIST;  Surgeon: Cindee Salt, MD;  Location: [facility];  Service: Orthopedics;  Laterality: Left;  . KNEE ARTHROSCOPY    . REPLACEMENT TOTAL KNEE Right 2008  . REPLACEMENT TOTAL KNEE Left 2012  . TONSILLECTOMY      Current Outpatient Medications:  .  Cholecalciferol (VITAMIN D3) 10000 units TABS, Take by mouth., Disp: , Rfl:  .  desvenlafaxine (PRISTIQ) 50 MG 24 hr tablet, Take 50 mg by mouth every morning., Disp: , Rfl: 0 .  fluticasone (FLONASE) 50 MCG/ACT nasal spray, instill 1 spray into each nostril once daily, Disp: , Rfl: 0 .  HYDROcodone-acetaminophen (NORCO) 5-325 MG tablet, Take 1 tablet by mouth every 6 (six) hours as needed for moderate pain., Disp: 30 tablet, Rfl: 0 .  L-Methylfolate-Algae (DEPLIN 7.5 PO), Take by mouth., Disp: , Rfl:  .  LIOTHYRONINE SODIUM PO, Take 1 tablet by mouth 2 (two) times daily., Disp: , Rfl:  .  montelukast (SINGULAIR) 10 MG tablet, Take 1 tablet (10 mg total) by mouth at bedtime., Disp: 30 tablet, Rfl: 0 .  Multiple Vitamin (MULTIVITAMIN) capsule, Take 1 capsule by mouth daily., Disp: , Rfl:  .  Multiple Vitamins-Minerals (ZINC PO), Take by mouth., Disp: , Rfl:  .  naproxen (NAPROSYN) 250 MG  tablet, Take 250 mg by mouth 2 (two) times daily., Disp: , Rfl: 1 .  OMEGA-3 FATTY ACIDS PO, Take by mouth., Disp: , Rfl:  .  propranolol (INDERAL) 10 MG tablet, Take 10 mg by mouth 2 (two) times daily. , Disp: , Rfl:  .  temazepam (RESTORIL) 15 MG capsule, Take 15 mg by mouth at bedtime. Pt. Takes  at bedtime, Disp: , Rfl: 0 .  traZODone (DESYREL) 100 MG tablet, Take 300 mg by mouth at bedtime., Disp: , Rfl: 0 .  zinc gluconate 50 MG tablet, Take 50 mg by mouth daily., Disp: , Rfl:   Allergies  Allergen Reactions  . Symbicort [Budesonide-Formoterol Fumarate] Nausea Only  . Bupropion Rash  . Sulfamethoxazole Rash   Review of Systems: Negative except as noted in the HPI. Denies N/V/F/Ch. Objective:  There were no vitals filed for this visit. General AA&O x3. Normal mood and affect.  Vascular Dorsalis pedis and posterior tibial pulses  present 2+ bilaterally  Capillary refill normal to all digits. Pedal hair growth normal.  Neurologic Epicritic sensation grossly present.  Dermatologic No open lesions. Interspaces clear of maceration. Nails well groomed and normal in appearance.  Orthopedic: MMT 5/5 in dorsiflexion, plantarflexion, inversion, and eversion. Normal joint ROM without pain or crepitus. Right foot HAV deformity with pain to palpation  about the medial eminence.  No gross instability   Assessment & Plan:  Patient was evaluated and treated and all questions answered.  HAV deformity right -X-rays taken reviewed structural bunion deformity. -Discussed the patient should benefit from surgical correction of the structural bunion deformity having failed all conservative therapy.  All risk benefits alternatives planned patient no guarantees given.  Patient elects to proceed.  We will proceed with right foot Austin bunionectomy.  Surgical consent reviewed and signed by patient.  Return in about 1 week (around 06/26/2017) for post op Austin bunionectomy right.

## 2017-07-22 ENCOUNTER — Telehealth: Payer: Self-pay | Admitting: *Deleted

## 2017-07-22 NOTE — Telephone Encounter (Signed)
"  I'm in [place], [place].  I'm scheduled for surgery on Wednesday of next week, the 22nd.  I fell and hurt my arm and my big toe on the foot he's going to do surgery on.  I had my foot x-rayed and nothing is broken.  It's swollen and stiff though.  I also have a bug bite on that leg.  Do you think I'll be okay to have surgery or should I come in to see him?"  It may be best to see him.  When will you return home?  "I will not get home until Friday.  I land at 1 pm."  He is not in our [place] office on Monday nor Tuesday, he's in our [place] office.  "Do you think I should reschedule my surgery?"  I will see what Dr. Samuella Cota says and give you a call back.  "It's bedtime here so just leave me a message on my cell."

## 2017-07-23 NOTE — Telephone Encounter (Signed)
Let's push her surgery back a week if she's ok with that so she can come into the office next Thursday or Friday.

## 2017-07-24 NOTE — Telephone Encounter (Signed)
I called and left the patient a message that Dr. Samuella Cota wants to see her prior to having surgery.  He wants to push the surgery back a week or two.  I asked her to call and schedule an appointment for next week.  I also informed her that per her insurance, more conservative treatment is needed anyway.  They are requesting additional clinical notes which we do not have anymore at this time.

## 2017-07-25 DIAGNOSIS — M79603 Pain in arm, unspecified: Secondary | ICD-10-CM | POA: Diagnosis not present

## 2017-07-28 ENCOUNTER — Telehealth: Payer: Self-pay | Admitting: *Deleted

## 2017-07-28 NOTE — Telephone Encounter (Signed)
"  Hey Debbie Schultz, this is [name].  You called earlier and left a message for me.  I just made an appointment to see Dr. Samuella Cota on May 30 at 10:15 in the morning.  I need to reschedule the surgery or whatever you need to do.  If you could give me a call later on today.  I won't be home until maybe 4:30 pm or call me on Monday.

## 2017-07-28 NOTE — Telephone Encounter (Signed)
I am calling to see if you have any additional clinical notes prior to presenting this request for authorization.  This is all we have at the present time.  "So, it's okay for me to submit this?"  Yes, it is fine."

## 2017-07-29 NOTE — Telephone Encounter (Addendum)
I'm returning your call.  How's your foot doing?  "It has gotten better.  It's still a little swollen."  Do you want to reschedule the surgery?  He can do it on May 29 or June 5.  "Let's do it on June 5.  I have an appointment to see him on May 30."  Okay, I will get it rescheduled.  I called and left Aram Beecham a message to reschedule patient's surgery from 07/30/2017 to 08/13/2017.

## 2017-07-29 NOTE — Telephone Encounter (Signed)
I rescheduled pt's post-op appointments to reflect new date of surgery.

## 2017-07-31 DIAGNOSIS — M79674 Pain in right toe(s): Secondary | ICD-10-CM | POA: Diagnosis not present

## 2017-07-31 DIAGNOSIS — S52124A Nondisplaced fracture of head of right radius, initial encounter for closed fracture: Secondary | ICD-10-CM | POA: Diagnosis not present

## 2017-08-01 ENCOUNTER — Other Ambulatory Visit: Payer: Medicare Other

## 2017-08-07 ENCOUNTER — Ambulatory Visit (INDEPENDENT_AMBULATORY_CARE_PROVIDER_SITE_OTHER): Payer: Medicare Other | Admitting: Podiatry

## 2017-08-07 ENCOUNTER — Ambulatory Visit: Payer: Self-pay

## 2017-08-07 DIAGNOSIS — M21611 Bunion of right foot: Secondary | ICD-10-CM

## 2017-08-07 DIAGNOSIS — S90211A Contusion of right great toe with damage to nail, initial encounter: Secondary | ICD-10-CM

## 2017-08-07 DIAGNOSIS — M2011 Hallux valgus (acquired), right foot: Secondary | ICD-10-CM

## 2017-08-07 NOTE — Progress Notes (Signed)
Subjective:  Patient ID: Debbie Schultz, female    DOB: Apr 14, 1950,  MRN: [id number]  No chief complaint on file.  67 y.o. female presents with the above complaint. Larey Seat when she was in [place] approx 2 weeks ago and injured R foot and R elbow including break of her Radial head. States that she was told nothing was broken in her foot.  Past Medical History:  Diagnosis Date  . Anxiety   . Arthritis    bilateral knees  . Depression   . Dysrhythmia    "high heart rate".  seen last Dec 2018  . Headache    resolved  . Hypothyroid   . Insomnia    Past Surgical History:  Procedure Laterality Date  . BLADDER SURGERY    . CATARACT EXTRACTION, BILATERAL    . CYST EXCISION Left 03/18/2017   Procedure: EXCISION OF VOLAR CYST OF LEFT WRIST;  Surgeon: Cindee Salt, MD;  Location: [facility];  Service: Orthopedics;  Laterality: Left;  . KNEE ARTHROSCOPY    . REPLACEMENT TOTAL KNEE Right 2008  . REPLACEMENT TOTAL KNEE Left 2012  . TONSILLECTOMY      Current Outpatient Medications:  .  Cholecalciferol (VITAMIN D3) 10000 units TABS, Take by mouth., Disp: , Rfl:  .  desvenlafaxine (PRISTIQ) 50 MG 24 hr tablet, Take 50 mg by mouth every morning., Disp: , Rfl: 0 .  fluticasone (FLONASE) 50 MCG/ACT nasal spray, instill 1 spray into each nostril once daily, Disp: , Rfl: 0 .  HYDROcodone-acetaminophen (NORCO) 5-325 MG tablet, Take 1 tablet by mouth every 6 (six) hours as needed for moderate pain., Disp: 30 tablet, Rfl: 0 .  L-Methylfolate-Algae (DEPLIN 7.5 PO), Take by mouth., Disp: , Rfl:  .  LIOTHYRONINE SODIUM PO, Take 1 tablet by mouth 2 (two) times daily., Disp: , Rfl:  .  montelukast (SINGULAIR) 10 MG tablet, Take 1 tablet (10 mg total) by mouth at bedtime., Disp: 30 tablet, Rfl: 0 .  Multiple Vitamin (MULTIVITAMIN) capsule, Take 1 capsule by mouth daily., Disp: , Rfl:  .  Multiple Vitamins-Minerals (ZINC PO), Take by mouth., Disp: , Rfl:  .  naproxen (NAPROSYN) 250 MG  tablet, Take 250 mg by mouth 2 (two) times daily., Disp: , Rfl: 1 .  OMEGA-3 FATTY ACIDS PO, Take by mouth., Disp: , Rfl:  .  propranolol (INDERAL) 10 MG tablet, Take 10 mg by mouth 2 (two) times daily. , Disp: , Rfl:  .  temazepam (RESTORIL) 15 MG capsule, Take 15 mg by mouth at bedtime. Pt. Takes  at bedtime, Disp: , Rfl: 0 .  traZODone (DESYREL) 100 MG tablet, Take 300 mg by mouth at bedtime., Disp: , Rfl: 0 .  zinc gluconate 50 MG tablet, Take 50 mg by mouth daily., Disp: , Rfl:   Allergies  Allergen Reactions  . Symbicort [Budesonide-Formoterol Fumarate] Nausea Only  . Bupropion Rash  . Sulfamethoxazole Rash   Review of Systems: Negative except as noted in the HPI. Denies N/V/F/Ch. Objective:  There were no vitals filed for this visit. General AA&O x3. Normal mood and affect.  Vascular Dorsalis pedis and posterior tibial pulses  present 2+ bilaterally  Capillary refill normal to all digits. Pedal hair growth normal.  Neurologic Epicritic sensation grossly present.  Dermatologic No open lesions. Interspaces clear of maceration. R Hallux nail subungual hematoma affecting approx 25% of hte nail proximally.  Orthopedic: MMT 5/5 in dorsiflexion, plantarflexion, inversion, and eversion. Normal joint ROM without pain or crepitus. Right foot HAV  deformity with pain to palpation about the medial eminence.  No gross instability   Assessment & Plan:  Patient was evaluated and treated and all questions answered.  HAV deformity right -Will proceed with surgery 6/29 to allow R elbow to heal further.  Subungual Hematoma R -Advised it will likely grow out. No follow-ups on file.

## 2017-08-15 ENCOUNTER — Other Ambulatory Visit: Payer: Medicare Other

## 2017-08-28 DIAGNOSIS — M25562 Pain in left knee: Secondary | ICD-10-CM | POA: Diagnosis not present

## 2017-08-28 DIAGNOSIS — M25561 Pain in right knee: Secondary | ICD-10-CM | POA: Diagnosis not present

## 2017-08-29 DIAGNOSIS — M25621 Stiffness of right elbow, not elsewhere classified: Secondary | ICD-10-CM | POA: Diagnosis not present

## 2017-08-29 DIAGNOSIS — M25562 Pain in left knee: Secondary | ICD-10-CM | POA: Diagnosis not present

## 2017-08-29 DIAGNOSIS — S52124D Nondisplaced fracture of head of right radius, subsequent encounter for closed fracture with routine healing: Secondary | ICD-10-CM | POA: Diagnosis not present

## 2017-08-29 DIAGNOSIS — M25561 Pain in right knee: Secondary | ICD-10-CM | POA: Diagnosis not present

## 2017-09-03 ENCOUNTER — Other Ambulatory Visit: Payer: Self-pay | Admitting: Podiatry

## 2017-09-03 ENCOUNTER — Encounter: Payer: Self-pay | Admitting: Podiatry

## 2017-09-03 DIAGNOSIS — M2011 Hallux valgus (acquired), right foot: Secondary | ICD-10-CM | POA: Diagnosis not present

## 2017-09-03 DIAGNOSIS — M25571 Pain in right ankle and joints of right foot: Secondary | ICD-10-CM | POA: Diagnosis not present

## 2017-09-03 DIAGNOSIS — M1990 Unspecified osteoarthritis, unspecified site: Secondary | ICD-10-CM | POA: Diagnosis not present

## 2017-09-03 MED ORDER — CEPHALEXIN 500 MG PO CAPS: 500.0000 mg | ORAL_CAPSULE | Freq: Two times a day (BID) | ORAL | 0 refills | Status: DC

## 2017-09-03 MED ORDER — OXYCODONE-ACETAMINOPHEN 10-325 MG PO TABS: 1.0000 | ORAL_TABLET | ORAL | 0 refills | Status: DC | PRN

## 2017-09-03 MED ORDER — ONDANSETRON HCL 4 MG PO TABS: 4.0000 mg | ORAL_TABLET | Freq: Three times a day (TID) | ORAL | 0 refills | Status: DC | PRN

## 2017-09-03 NOTE — Progress Notes (Signed)
Rx sent to pharmacy   

## 2017-09-05 ENCOUNTER — Ambulatory Visit (INDEPENDENT_AMBULATORY_CARE_PROVIDER_SITE_OTHER): Payer: Medicare Other

## 2017-09-05 ENCOUNTER — Ambulatory Visit (INDEPENDENT_AMBULATORY_CARE_PROVIDER_SITE_OTHER): Payer: Self-pay | Admitting: Podiatry

## 2017-09-05 VITALS — Temp 98.1°F

## 2017-09-05 DIAGNOSIS — M2011 Hallux valgus (acquired), right foot: Secondary | ICD-10-CM

## 2017-09-05 DIAGNOSIS — M21611 Bunion of right foot: Secondary | ICD-10-CM | POA: Diagnosis not present

## 2017-09-05 NOTE — Progress Notes (Signed)
  Subjective:  Patient ID: Debbie Schultz, female    DOB: 08/18/1950,  MRN: [id number]  Chief Complaint  Patient presents with  . [facility]     dos 06.26.2019 Debbie Schultz Bunionectomy Rt " my foot is doing well, i am managing my pain well at this time"     DOS: 09/03/17 Procedure: [name] Bunionectomy  67 y.o. female returns for post-op check. Denies N/V/F/Ch. Pain is controlled with current medications.  Objective:   General AA&O x3. Normal mood and affect.  Vascular Foot warm and well perfused.  Neurologic Gross sensation intact.  Dermatologic Skin healing well without signs of infection. Skin edges well coapted without signs of infection.  Orthopedic: Tenderness to palpation noted about the surgical site.    Assessment & Plan:  Patient was evaluated and treated and all questions answered.  S/p Austin Bunionectomy R -Progressing as expected post-operatively. -Sutures: intact. -Medications refilled: none -Foot redressed. -XR taken and reviewed healing well good position noted no evidence of HWF.  Return in about 1 week (around 09/12/2017).

## 2017-09-08 ENCOUNTER — Telehealth: Payer: Self-pay | Admitting: *Deleted

## 2017-09-08 NOTE — Telephone Encounter (Signed)
Patient is concerned about being on a heavy pain medication and becoming addicted to it

## 2017-09-08 NOTE — Telephone Encounter (Signed)
Called pt - discussed with her the level of pain she was experiencing and she did say that it was intense at times. I advised she didn't have to take the pain medication as instructed on her bottle if she felt she didn't need it, that an OTC Tylenol or Ibuprofen may work as well for her. I informed her she should discuss her concerns with Dr. Samuella CotaPrice on Wednesday at her POV appt.

## 2017-09-09 NOTE — Progress Notes (Signed)
DOS  09/03/2017  Austin Bunionectomy right

## 2017-09-10 ENCOUNTER — Ambulatory Visit (INDEPENDENT_AMBULATORY_CARE_PROVIDER_SITE_OTHER): Payer: Medicare Other | Admitting: Podiatry

## 2017-09-10 ENCOUNTER — Encounter: Payer: Self-pay | Admitting: Podiatry

## 2017-09-10 DIAGNOSIS — Z9889 Other specified postprocedural states: Secondary | ICD-10-CM

## 2017-09-18 ENCOUNTER — Ambulatory Visit: Payer: Medicare Other | Admitting: Podiatry

## 2017-09-18 DIAGNOSIS — Z9889 Other specified postprocedural states: Secondary | ICD-10-CM

## 2017-09-18 MED ORDER — OXYCODONE-ACETAMINOPHEN 5-325 MG PO TABS: 1.0000 | ORAL_TABLET | ORAL | 0 refills | Status: DC | PRN

## 2017-09-18 MED ORDER — HYDROCODONE-ACETAMINOPHEN 5-325 MG PO TABS: 1.0000 | ORAL_TABLET | Freq: Four times a day (QID) | ORAL | 0 refills | Status: DC | PRN

## 2017-09-19 DIAGNOSIS — R739 Hyperglycemia, unspecified: Secondary | ICD-10-CM | POA: Diagnosis not present

## 2017-09-29 NOTE — Progress Notes (Signed)
  Subjective:  Patient ID: Debbie FlakeKathryn Schultz, female    DOB: 01/13/1951,  MRN: [id number]  Chief Complaint  Patient presents with  . Routine Post Op    i had surgery on 09-03-17 on right foot     DOS: 09/03/17 Procedure: [name] Bunionectomy  67 y.o. female returns for post-op check. Denies N/V/F/Ch.  Doing very well.  Pain controlled.  Objective:   General AA&O x3. Normal mood and affect.  Vascular Foot warm and well perfused.  Neurologic Gross sensation intact.  Dermatologic Skin healing well without signs of infection. Skin edges well coapted without signs of infection.  Orthopedic: Tenderness to palpation noted about the surgical site.    Assessment & Plan:  Patient was evaluated and treated and all questions answered.  S/p Austin Bunionectomy R -Progressing as expected post-operatively. -Sutures: intact. -Medications refilled: none -Foot redressed.  Return in about 1 week (around 09/17/2017) for Post-op.

## 2017-09-29 NOTE — Progress Notes (Signed)
  Subjective:  Patient ID: Debbie Schultz, female    DOB: 07/03/1950,  MRN: [id number]  No chief complaint on file.   DOS: 09/03/17 Procedure: [name] Bunionectomy  67 y.o. female returns for post-op check. Denies N/V/F/Ch.  Reports the foot is very sore.  Has been more active swelling.  Objective:   General AA&O x3. Normal mood and affect.  Vascular Foot warm and well perfused.  Neurologic Gross sensation intact.  Dermatologic Skin healing well without signs of infection. Skin edges well coapted without signs of infection.  Orthopedic: Tenderness to palpation noted about the surgical site.   Assessment & Plan:  Patient was evaluated and treated and all questions answered.  S/p Austin Bunionectomy R -Progressing as expected post-operatively. -Sutures: Suture ends cut.  Okay to shower. -Medications refilled: none -Foot redressed.  Return in about 2 weeks (around 10/02/2017).

## 2017-10-02 ENCOUNTER — Ambulatory Visit (INDEPENDENT_AMBULATORY_CARE_PROVIDER_SITE_OTHER): Payer: Medicare Other | Admitting: Podiatry

## 2017-10-02 ENCOUNTER — Ambulatory Visit (INDEPENDENT_AMBULATORY_CARE_PROVIDER_SITE_OTHER): Payer: Medicare Other

## 2017-10-02 DIAGNOSIS — M2011 Hallux valgus (acquired), right foot: Secondary | ICD-10-CM

## 2017-10-02 DIAGNOSIS — M21611 Bunion of right foot: Secondary | ICD-10-CM

## 2017-10-02 NOTE — Progress Notes (Addendum)
  Subjective:  Patient ID: Debbie Schultz, female    DOB: 03/14/1950,  MRN: [id number]  Chief Complaint  Patient presents with  [facility]     [facility].26.2019 Debbie Schultz    DOS: 09/03/17 Procedure: Debbie DellR Debbie Bunionectomy  67 y.o. female returns for post-op check. Denies N/V/F/Ch.  Doing well no pain.  Objective:   General AA&O x3. Normal mood and affect.  Vascular Foot warm and well perfused.  Neurologic Gross sensation intact.  Dermatologic Skin well-healed  Orthopedic: Tenderness to palpation noted about the surgical site.   Assessment & Plan:  Patient was evaluated and treated and all questions answered.  S/p Debbie Bunionectomy R -Progressing as expected post-operatively. -X-rays taken reviewed consistent with postop state no evidence of hardware failure. -Medications refilled: none -Compression sleeve applied. -Surgical shoe dispensed. -WBAT in surgical shoe. -Refer to PT. Return in about 2 weeks (around 10/16/2017) for Post-op check with new XRs.

## 2017-10-08 DIAGNOSIS — M2011 Hallux valgus (acquired), right foot: Secondary | ICD-10-CM | POA: Diagnosis not present

## 2017-10-08 DIAGNOSIS — M21611 Bunion of right foot: Secondary | ICD-10-CM | POA: Diagnosis not present

## 2017-10-08 DIAGNOSIS — M25571 Pain in right ankle and joints of right foot: Secondary | ICD-10-CM | POA: Diagnosis not present

## 2017-10-08 DIAGNOSIS — Z9889 Other specified postprocedural states: Secondary | ICD-10-CM | POA: Diagnosis not present

## 2017-10-09 DIAGNOSIS — F332 Major depressive disorder, recurrent severe without psychotic features: Secondary | ICD-10-CM | POA: Diagnosis not present

## 2017-10-10 DIAGNOSIS — M21611 Bunion of right foot: Secondary | ICD-10-CM | POA: Diagnosis not present

## 2017-10-10 DIAGNOSIS — M2011 Hallux valgus (acquired), right foot: Secondary | ICD-10-CM | POA: Diagnosis not present

## 2017-10-10 DIAGNOSIS — Z9889 Other specified postprocedural states: Secondary | ICD-10-CM | POA: Diagnosis not present

## 2017-10-10 DIAGNOSIS — M25571 Pain in right ankle and joints of right foot: Secondary | ICD-10-CM | POA: Diagnosis not present

## 2017-10-13 DIAGNOSIS — M21611 Bunion of right foot: Secondary | ICD-10-CM | POA: Diagnosis not present

## 2017-10-13 DIAGNOSIS — Z9889 Other specified postprocedural states: Secondary | ICD-10-CM | POA: Diagnosis not present

## 2017-10-13 DIAGNOSIS — M25571 Pain in right ankle and joints of right foot: Secondary | ICD-10-CM | POA: Diagnosis not present

## 2017-10-13 DIAGNOSIS — M2011 Hallux valgus (acquired), right foot: Secondary | ICD-10-CM | POA: Diagnosis not present

## 2017-10-15 DIAGNOSIS — Z9889 Other specified postprocedural states: Secondary | ICD-10-CM | POA: Diagnosis not present

## 2017-10-15 DIAGNOSIS — M25571 Pain in right ankle and joints of right foot: Secondary | ICD-10-CM | POA: Diagnosis not present

## 2017-10-15 DIAGNOSIS — M21611 Bunion of right foot: Secondary | ICD-10-CM | POA: Diagnosis not present

## 2017-10-15 DIAGNOSIS — M2011 Hallux valgus (acquired), right foot: Secondary | ICD-10-CM | POA: Diagnosis not present

## 2017-10-16 ENCOUNTER — Encounter: Payer: Self-pay | Admitting: Podiatry

## 2017-10-16 ENCOUNTER — Ambulatory Visit (INDEPENDENT_AMBULATORY_CARE_PROVIDER_SITE_OTHER): Payer: Medicare Other

## 2017-10-16 ENCOUNTER — Ambulatory Visit (INDEPENDENT_AMBULATORY_CARE_PROVIDER_SITE_OTHER): Payer: Medicare Other | Admitting: Podiatry

## 2017-10-16 VITALS — BP 111/66 | HR 90 | Resp 15

## 2017-10-16 DIAGNOSIS — M21611 Bunion of right foot: Secondary | ICD-10-CM

## 2017-10-16 DIAGNOSIS — Z9889 Other specified postprocedural states: Secondary | ICD-10-CM

## 2017-10-16 DIAGNOSIS — M2011 Hallux valgus (acquired), right foot: Secondary | ICD-10-CM

## 2017-10-16 DIAGNOSIS — L600 Ingrowing nail: Secondary | ICD-10-CM | POA: Diagnosis not present

## 2017-10-16 NOTE — Patient Instructions (Signed)

## 2017-10-17 DIAGNOSIS — M2011 Hallux valgus (acquired), right foot: Secondary | ICD-10-CM | POA: Diagnosis not present

## 2017-10-17 DIAGNOSIS — Z9889 Other specified postprocedural states: Secondary | ICD-10-CM | POA: Diagnosis not present

## 2017-10-17 DIAGNOSIS — M21611 Bunion of right foot: Secondary | ICD-10-CM | POA: Diagnosis not present

## 2017-10-17 DIAGNOSIS — M25571 Pain in right ankle and joints of right foot: Secondary | ICD-10-CM | POA: Diagnosis not present

## 2017-10-20 DIAGNOSIS — Z9889 Other specified postprocedural states: Secondary | ICD-10-CM | POA: Diagnosis not present

## 2017-10-20 DIAGNOSIS — M2011 Hallux valgus (acquired), right foot: Secondary | ICD-10-CM | POA: Diagnosis not present

## 2017-10-20 DIAGNOSIS — M25571 Pain in right ankle and joints of right foot: Secondary | ICD-10-CM | POA: Diagnosis not present

## 2017-10-20 DIAGNOSIS — M21611 Bunion of right foot: Secondary | ICD-10-CM | POA: Diagnosis not present

## 2017-10-23 DIAGNOSIS — M2011 Hallux valgus (acquired), right foot: Secondary | ICD-10-CM | POA: Diagnosis not present

## 2017-10-23 DIAGNOSIS — M21611 Bunion of right foot: Secondary | ICD-10-CM | POA: Diagnosis not present

## 2017-10-23 DIAGNOSIS — Z9889 Other specified postprocedural states: Secondary | ICD-10-CM | POA: Diagnosis not present

## 2017-10-23 DIAGNOSIS — M25571 Pain in right ankle and joints of right foot: Secondary | ICD-10-CM | POA: Diagnosis not present

## 2017-10-27 DIAGNOSIS — M21611 Bunion of right foot: Secondary | ICD-10-CM | POA: Diagnosis not present

## 2017-10-27 DIAGNOSIS — Z9889 Other specified postprocedural states: Secondary | ICD-10-CM | POA: Diagnosis not present

## 2017-10-27 DIAGNOSIS — M2011 Hallux valgus (acquired), right foot: Secondary | ICD-10-CM | POA: Diagnosis not present

## 2017-10-27 DIAGNOSIS — M25571 Pain in right ankle and joints of right foot: Secondary | ICD-10-CM | POA: Diagnosis not present

## 2017-10-30 ENCOUNTER — Ambulatory Visit (INDEPENDENT_AMBULATORY_CARE_PROVIDER_SITE_OTHER): Payer: Medicare Other | Admitting: Podiatry

## 2017-10-30 DIAGNOSIS — M2011 Hallux valgus (acquired), right foot: Secondary | ICD-10-CM | POA: Diagnosis not present

## 2017-10-30 DIAGNOSIS — M25571 Pain in right ankle and joints of right foot: Secondary | ICD-10-CM | POA: Diagnosis not present

## 2017-10-30 DIAGNOSIS — Z9889 Other specified postprocedural states: Secondary | ICD-10-CM | POA: Diagnosis not present

## 2017-10-30 DIAGNOSIS — M21611 Bunion of right foot: Secondary | ICD-10-CM | POA: Diagnosis not present

## 2017-10-30 NOTE — Patient Instructions (Signed)
Epsom Salt Soak Instructions    IF SOAKING IS STILL NECESSARY, START THIS 2 WEEKS AFTER INITIAL PROCEDURE   Place 1/4 cup of epsom salts in a quart of warm tap water.  Soak your foot or feet in the solution for 20 minutes twice a day until you notice the area has dried and a scab has formed. Continue to apply other medications to the area as directed by the doctor such as polysporin, neosporin or cortisporin drops.  IF YOUR SKIN BECOMES IRRITATED WHILE USING THESE INSTRUCTIONS, IT IS OKAY TO SWITCH TO  WHITE VINEGAR AND WATER. Or you may use antibacterial soap and water to keep the toe clean  Monitor for any signs/symptoms of infection. Call the office immediately if any occur or go directly to the emergency room. Call with any questions/concerns.  Please continue to soak until there is no pain, no redness and no drainage. Bandage on during the day and off at night. Report any signs of symptoms of infection, examples are: increased pain, increased swelling or drainage, increased redness. Please call with any questions or concernsPlease continue to soak until there is no pain, no redness and no drainage. Bandage on during the day and off at night. Report any signs of symptoms of infection, examples are: increased pain, increased swelling or drainage, increased redness. Please call with any questions or concerns 

## 2017-10-30 NOTE — Progress Notes (Signed)
Subjective:  Patient ID: Debbie FlakeKathryn Horen, female    DOB: 01/31/1951,  MRN: [id number]  Chief Complaint  Patient presents with  . Routine Post Op    " my foot is feeling pretty good, I continue to work with PT"     DOS: 09/03/2017 Procedure: Austin bunionectomy right  DOS: 10/16/17 Procedure: Removal of ingrown toenail left hallux nail  67 y.o. female returns for post-op check.  Doing well from both the bunion toenail.  Has been working with PT and states that she is noting improvement.  Had a small bit of skin that was irritated by wearing a sandal that she wanted to get checked.  Has soaked as directed.  Review of Systems: Negative except as noted in the HPI. Denies N/V/F/Ch.  Past Medical History:  Diagnosis Date  . Anxiety   . Arthritis    bilateral knees  . Depression   . Dysrhythmia    "high heart rate".  seen last Dec 2018  . Headache    resolved  . Hypothyroid   . Insomnia     Current Outpatient Medications:  .  cephALEXin (KEFLEX) 500 MG capsule, Take 1 capsule (500 mg total) by mouth 2 (two) times daily., Disp: 14 capsule, Rfl: 0 .  Cholecalciferol (VITAMIN D3) 10000 units TABS, Take by mouth., Disp: , Rfl:  .  desvenlafaxine (PRISTIQ) 50 MG 24 hr tablet, Take 50 mg by mouth every morning., Disp: , Rfl: 0 .  fluticasone (FLONASE) 50 MCG/ACT nasal spray, instill 1 spray into each nostril once daily, Disp: , Rfl: 0 .  HYDROcodone-acetaminophen (NORCO) 5-325 MG tablet, Take 1 tablet by mouth every 6 (six) hours as needed for moderate pain., Disp: 30 tablet, Rfl: 0 .  L-Methylfolate-Algae (DEPLIN 7.5 PO), Take by mouth., Disp: , Rfl:  .  LIOTHYRONINE SODIUM PO, Take 1 tablet by mouth 2 (two) times daily., Disp: , Rfl:  .  montelukast (SINGULAIR) 10 MG tablet, Take 1 tablet (10 mg total) by mouth at bedtime., Disp: 30 tablet, Rfl: 0 .  Multiple Vitamin (MULTIVITAMIN) capsule, Take 1 capsule by mouth daily., Disp: , Rfl:  .  Multiple Vitamins-Minerals (ZINC PO), Take by  mouth., Disp: , Rfl:  .  naproxen (NAPROSYN) 250 MG tablet, Take 250 mg by mouth 2 (two) times daily., Disp: , Rfl: 1 .  OMEGA-3 FATTY ACIDS PO, Take by mouth., Disp: , Rfl:  .  ondansetron (ZOFRAN) 4 MG tablet, Take 1 tablet (4 mg total) by mouth every 8 (eight) hours as needed for nausea or vomiting., Disp: 20 tablet, Rfl: 0 .  oxyCODONE-acetaminophen (PERCOCET/ROXICET) 5-325 MG tablet, Take 1 tablet by mouth every 4 (four) hours as needed for severe pain., Disp: 20 tablet, Rfl: 0 .  propranolol (INDERAL) 10 MG tablet, Take 10 mg by mouth 2 (two) times daily. , Disp: , Rfl:  .  temazepam (RESTORIL) 15 MG capsule, Take 15 mg by mouth at bedtime. Pt. Takes 30mg  at bedtime, Disp: , Rfl: 0 .  traZODone (DESYREL) 100 MG tablet, Take 300 mg by mouth at bedtime., Disp: , Rfl: 0 .  zinc gluconate 50 MG tablet, Take 50 mg by mouth daily., Disp: , Rfl:   Social History   Tobacco Use  Smoking Status Never Smoker  Smokeless Tobacco Never Used    Allergies  Allergen Reactions  . Symbicort [Budesonide-Formoterol Fumarate] Nausea Only  . Bupropion Rash  . Sulfamethoxazole Rash   Objective:  There were no vitals filed for this visit. There is no  height or weight on file to calculate BMI. Constitutional Well developed. Well nourished.  Vascular Foot warm and well perfused. Capillary refill normal to all digits.   Neurologic Normal speech. Oriented to person, place, and time. Epicritic sensation to light touch grossly present bilaterally.  Dermatologic skin well-healed small area of retained suture Left hallux nail ingrown toenail removal site well-appearing without active infection   Orthopedic: Tenderness to palpation noted about the surgical site.   Radiographs: None today Assessment:   1. Post-operative state    Plan:  Patient was evaluated and treated and all questions answered.  S/p foot surgery bilaterally -Progressing as expected post-operatively. -XR: None today -WB Status:  Weightbearing as tolerate in normal shoe gear -Sutures: Wound healed. -Medications: None -Foot redressed. -Continue PT -Neosporin and Band-Aid every other day to the left great toenail.  DC soaking  Return in about 4 weeks (around 11/27/2017) for Post-op bunion surgery DOS  09/03/17.

## 2017-11-03 DIAGNOSIS — M25571 Pain in right ankle and joints of right foot: Secondary | ICD-10-CM | POA: Diagnosis not present

## 2017-11-03 DIAGNOSIS — Z9889 Other specified postprocedural states: Secondary | ICD-10-CM | POA: Diagnosis not present

## 2017-11-03 DIAGNOSIS — M2011 Hallux valgus (acquired), right foot: Secondary | ICD-10-CM | POA: Diagnosis not present

## 2017-11-03 DIAGNOSIS — M21611 Bunion of right foot: Secondary | ICD-10-CM | POA: Diagnosis not present

## 2017-11-04 DIAGNOSIS — M25521 Pain in right elbow: Secondary | ICD-10-CM | POA: Diagnosis not present

## 2017-11-04 DIAGNOSIS — M25561 Pain in right knee: Secondary | ICD-10-CM | POA: Diagnosis not present

## 2017-11-06 DIAGNOSIS — Z9889 Other specified postprocedural states: Secondary | ICD-10-CM | POA: Diagnosis not present

## 2017-11-06 DIAGNOSIS — M21611 Bunion of right foot: Secondary | ICD-10-CM | POA: Diagnosis not present

## 2017-11-06 DIAGNOSIS — M2011 Hallux valgus (acquired), right foot: Secondary | ICD-10-CM | POA: Diagnosis not present

## 2017-11-06 DIAGNOSIS — M25571 Pain in right ankle and joints of right foot: Secondary | ICD-10-CM | POA: Diagnosis not present

## 2017-11-07 ENCOUNTER — Other Ambulatory Visit: Payer: Self-pay | Admitting: Orthopedic Surgery

## 2017-11-07 DIAGNOSIS — M25521 Pain in right elbow: Secondary | ICD-10-CM

## 2017-11-14 ENCOUNTER — Ambulatory Visit
Admission: RE | Admit: 2017-11-14 | Discharge: 2017-11-14 | Disposition: A | Discharge status: Home / Self Care | Payer: Medicare Other | Source: Ambulatory Visit | Attending: Orthopedic Surgery | Admitting: Orthopedic Surgery

## 2017-11-14 DIAGNOSIS — S52135A Nondisplaced fracture of neck of left radius, initial encounter for closed fracture: Secondary | ICD-10-CM | POA: Diagnosis not present

## 2017-11-14 DIAGNOSIS — M25521 Pain in right elbow: Secondary | ICD-10-CM

## 2017-11-16 NOTE — Progress Notes (Signed)
  Subjective:  Patient ID: Debbie Schultz, female    DOB: 1950-10-26,  MRN: [id number]  Chief Complaint  Patient presents with  . Routine Post [employer] 06.26.2019 Austin Bunionectomy Rt Pt, stated," it's doing pretty good." Tx none -pt deneis N/V/?F/Ch   DOS: 09/03/17 Procedure: Lowella Dell Bunionectomy  67 y.o. female returns for post-op check. Denies N/V/F/Ch.  Doing well for her bunion; complains of ingrown toenail to the left great toe.  Objective:   General AA&O x3. Normal mood and affect.  Vascular Foot warm and well perfused.  Neurologic Gross sensation intact.  Dermatologic Skin well-healed. Ingrown toenail left great toe medial border.  Orthopedic: Tenderness to palpation noted about the surgical site.   Assessment & Plan:  Patient was evaluated and treated and all questions answered.  S/p Austin Bunionectomy R -Progressing as expected post-operatively. -X-rays taken reviewed consistent with postop state no evidence of hardware failure. -Medications refilled: none -Compression sleeve applied. -Surgical shoe dispensed. -WBAT in surgical shoe. -Refer to PT.  Ingrown Nail, left -Patient elects to proceed with ingrown toenail removal today -Ingrown nail excised. See procedure note. -Educated on post-procedure care including soaking. Written instructions provided. -Patient to follow up in 2 weeks for nail check.  Procedure: Excision of Ingrown Toenail Location: Left 1st toe medial nail borders. Anesthesia: Lidocaine 1% plain; 1.33mL and Marcaine 0.5% plain; 1.26mL, digital block. Skin Prep: Alcohol. Dressing: Silvadene; telfa; dry, sterile, compression dressing. Technique: Following skin prep, the toe was exsanguinated and a tourniquet was secured at the base of the toe. The affected nail border was freed, split with a nail splitter, and excised. Chemical matrixectomy was then performed with phenol and irrigated out with alcohol. The tourniquet was then removed and sterile  dressing applied. Disposition: Patient tolerated procedure well. Patient to return in 2 weeks for follow-up.   No follow-ups on file.

## 2017-11-17 DIAGNOSIS — M25521 Pain in right elbow: Secondary | ICD-10-CM | POA: Diagnosis not present

## 2017-11-20 ENCOUNTER — Ambulatory Visit (INDEPENDENT_AMBULATORY_CARE_PROVIDER_SITE_OTHER): Payer: Medicare Other | Admitting: Podiatry

## 2017-11-20 DIAGNOSIS — M2041 Other hammer toe(s) (acquired), right foot: Secondary | ICD-10-CM

## 2017-11-20 DIAGNOSIS — M2042 Other hammer toe(s) (acquired), left foot: Secondary | ICD-10-CM

## 2017-11-20 DIAGNOSIS — M205X1 Other deformities of toe(s) (acquired), right foot: Secondary | ICD-10-CM

## 2017-11-20 DIAGNOSIS — M205X2 Other deformities of toe(s) (acquired), left foot: Secondary | ICD-10-CM

## 2017-11-20 NOTE — Patient Instructions (Signed)
Pre-Operative Instructions  Congratulations, you have decided to take an important step towards improving your quality of life.  You can be assured that the doctors and staff at [facility] will be with you every step of the way.  Here are some important things you should know:  1. Plan to be at the surgery center/hospital at least 1 (one) hour prior to your scheduled time, unless otherwise directed by the surgical center/hospital staff.  You must have a responsible adult accompany you, remain during the surgery and drive you home.  Make sure you have directions to the surgical center/hospital to ensure you arrive on time. 2. If you are having surgery at [facility] or [facility] hospitals, you will need a copy of your medical history and physical form from your family physician within one month prior to the date of surgery. We will give you a form for your primary physician to complete.  3. We make every effort to accommodate the date you request for surgery.  However, there are times where surgery dates or times have to be moved.  We will contact you as soon as possible if a change in schedule is required.   4. No aspirin/ibuprofen for one week before surgery.  If you are on aspirin, any non-steroidal anti-inflammatory medications (Mobic, Aleve, Ibuprofen) should not be taken seven (7) days prior to your surgery.  You make take Tylenol for pain prior to surgery.  5. Medications - If you are taking daily heart and blood pressure medications, seizure, reflux, allergy, asthma, anxiety, pain or diabetes medications, make sure you notify the surgery center/hospital before the day of surgery so they can tell you which medications you should take or avoid the day of surgery. 6. No food or drink after midnight the night before surgery unless directed otherwise by surgical center/hospital staff. 7. No alcoholic beverages 24-hours prior to surgery.  No smoking 24-hours prior or 24-hours after  surgery. 8. Wear loose pants or shorts. They should be loose enough to fit over bandages, boots, and casts. 9. Don't wear slip-on shoes. Sneakers are preferred. 10. Bring your boot with you to the surgery center/hospital.  Also bring crutches or a walker if your physician has prescribed it for you.  If you do not have this equipment, it will be provided for you after surgery. 11. If you have not been contacted by the surgery center/hospital by the day before your surgery, call to confirm the date and time of your surgery. 12. Leave-time from work may vary depending on the type of surgery you have.  Appropriate arrangements should be made prior to surgery with your employer. 13. Prescriptions will be provided immediately following surgery by your doctor.  Fill these as soon as possible after surgery and take the medication as directed. Pain medications will not be refilled on weekends and must be approved by the doctor. 14. Remove nail polish on the operative foot and avoid getting pedicures prior to surgery. 15. Wash the night before surgery.  The night before surgery wash the foot and leg well with water and the antibacterial soap provided. Be sure to pay special attention to beneath the toenails and in between the toes.  Wash for at least three (3) minutes. Rinse thoroughly with water and dry well with a towel.  Perform this wash unless told not to do so by your physician.  Enclosed: 1 Ice pack (please put in freezer the night before surgery)   1 Hibiclens skin cleaner     Pre-op instructions  If you have any questions regarding the instructions, please do not hesitate to call our office.  [place]: [address], [place], [place] [address] -- [phone]  [place]: [address] , [place] [address] -- [phone]  [place]: [address]  [place], [place] [address] -- [phone]  [place]: [address], [address], [place], [place] [address] -- [phone]  Website:  [email or web address] 

## 2017-11-20 NOTE — Progress Notes (Signed)
Subjective:  Patient ID: Debbie Schultz, female    DOB: 11/06/1950,  MRN: [id number]  Chief Complaint  Patient presents with  . Routine Post Op      4 Week Post-op bunion surgery - both big toes are better, right second toe is bothering her    67 y.o. female presents with the above complaint.  States her bunion correction on her right foot is doing very well.  Denies pain.  Pleased with results.  Similarly pleased with the result of the ingrown toenail removed on the left great toe.  He complains today though that the second toes especially the right second and additionally the third are causing her pain.  States that they are slightly curved and also are long and hit the end of her shoes. States that now that she has to wear close toed shoes since it is the fall the toes are hurting more. Would like to discuss possible surgical correction  Review of Systems: Negative except as noted in the HPI. Denies N/V/F/Ch.  Past Medical History:  Diagnosis Date  . Anxiety   . Arthritis    bilateral knees  . Depression   . Dysrhythmia    "high heart rate".  seen last Dec 2018  . Headache    resolved  . Hypothyroid   . Insomnia     Current Outpatient Medications:  .  cephALEXin (KEFLEX) 500 MG capsule, Take 1 capsule (500 mg total) by mouth 2 (two) times daily., Disp: 14 capsule, Rfl: 0 .  Cholecalciferol (VITAMIN D3) 10000 units TABS, Take by mouth., Disp: , Rfl:  .  desvenlafaxine (PRISTIQ) 50 MG 24 hr tablet, Take 50 mg by mouth every morning., Disp: , Rfl: 0 .  fluticasone (FLONASE) 50 MCG/ACT nasal spray, instill 1 spray into each nostril once daily, Disp: , Rfl: 0 .  HYDROcodone-acetaminophen (NORCO) 5-325 MG tablet, Take 1 tablet by mouth every 6 (six) hours as needed for moderate pain., Disp: 30 tablet, Rfl: 0 .  L-Methylfolate-Algae (DEPLIN 7.5 PO), Take by mouth., Disp: , Rfl:  .  LIOTHYRONINE SODIUM PO, Take 1 tablet by mouth 2 (two) times daily., Disp: , Rfl:  .  montelukast  (SINGULAIR) 10 MG tablet, Take 1 tablet (10 mg total) by mouth at bedtime., Disp: 30 tablet, Rfl: 0 .  Multiple Vitamin (MULTIVITAMIN) capsule, Take 1 capsule by mouth daily., Disp: , Rfl:  .  Multiple Vitamins-Minerals (ZINC PO), Take by mouth., Disp: , Rfl:  .  naproxen (NAPROSYN) 250 MG tablet, Take 250 mg by mouth 2 (two) times daily., Disp: , Rfl: 1 .  OMEGA-3 FATTY ACIDS PO, Take by mouth., Disp: , Rfl:  .  ondansetron (ZOFRAN) 4 MG tablet, Take 1 tablet (4 mg total) by mouth every 8 (eight) hours as needed for nausea or vomiting., Disp: 20 tablet, Rfl: 0 .  oxyCODONE-acetaminophen (PERCOCET/ROXICET) 5-325 MG tablet, Take 1 tablet by mouth every 4 (four) hours as needed for severe pain., Disp: 20 tablet, Rfl: 0 .  propranolol (INDERAL) 10 MG tablet, Take 10 mg by mouth 2 (two) times daily. , Disp: , Rfl:  .  temazepam (RESTORIL) 15 MG capsule, Take 15 mg by mouth at bedtime. Pt. Takes 30mg  at bedtime, Disp: , Rfl: 0 .  traZODone (DESYREL) 100 MG tablet, Take 300 mg by mouth at bedtime., Disp: , Rfl: 0 .  zinc gluconate 50 MG tablet, Take 50 mg by mouth daily., Disp: , Rfl:   Social History   Tobacco Use  Smoking  Status Never Smoker  Smokeless Tobacco Never Used    Allergies  Allergen Reactions  . Symbicort [Budesonide-Formoterol Fumarate] Nausea Only  . Bupropion Rash  . Sulfamethoxazole Rash   Objective:  There were no vitals filed for this visit. There is no height or weight on file to calculate BMI. Constitutional Well developed. Well nourished.  Vascular Dorsalis pedis pulses palpable bilaterally. Posterior tibial pulses palpable bilaterally. Capillary refill normal to all digits.  No cyanosis or clubbing noted. Pedal hair growth normal.  Neurologic Normal speech. Oriented to person, place, and time. Epicritic sensation to light touch grossly present bilaterally.  Dermatologic Nails well groomed and normal in appearance. No open wounds. No skin lesions.  Orthopedic:  Normal joint ROM without pain or crepitus bilaterally. No visible deformities. Hammertoe deformities present second toe left and right second third toes. Right first MPJ rectus with good range of motion no pain on range of motion no tenderness.   Radiographs: None today Assessment:   1. Hammer toe of left foot   2. Hammer toe of right foot   3. Contracture of toe of left foot   4. Contracture of toe of right foot    Plan:  Patient was evaluated and treated and all questions answered.  Hammertoes left second toe, right second and right third toe -Discussed conservative care including changing shoe gear.   -Patient has failed all conservative therapy and wishes to proceed with surgical intervention. All risks, benefits, and alternatives discussed with patient. No guarantees given. Consent reviewed and signed by patient. -Planned procedures: Correction of hammertoes left second toe right second toe right third toe with possible pin or screw fixation.  History of bunion correction right great toe -Doing well.  Patient is pleased with result.  Return for post op.

## 2017-11-21 ENCOUNTER — Encounter

## 2017-11-21 DIAGNOSIS — M25521 Pain in right elbow: Secondary | ICD-10-CM | POA: Diagnosis not present

## 2017-11-24 ENCOUNTER — Telehealth: Payer: Self-pay | Admitting: *Deleted

## 2017-11-24 NOTE — Telephone Encounter (Signed)
Perfect thanks

## 2017-11-24 NOTE — Telephone Encounter (Signed)
I called and spoke to Debbie Schultz at [facility].  I called to see if authorization is needed for cpt code [id number] per Dr. Samuella CotaPrice.  Debbie Schultz informed me that authorization was not needed for this code.  I asked her for a reference number she said it is the patient's member id number, which is Debbie Schultz.

## 2017-11-27 ENCOUNTER — Other Ambulatory Visit: Payer: Medicare Other | Admitting: Podiatry

## 2017-11-27 ENCOUNTER — Telehealth: Payer: Self-pay | Admitting: Podiatry

## 2017-11-27 NOTE — Telephone Encounter (Signed)
I asked pt to describe what her foot is doing. Pt states her foot, the toes are spreading and like they are cramping. I informed pt of my email address.

## 2017-11-27 NOTE — Telephone Encounter (Signed)
Margarette [email or web address]>  Thu 11/27/2017 3:58 PM  Gershon Crane'Connell

## 2017-11-27 NOTE — Telephone Encounter (Signed)
Pt is scheduled for surgery on Sept 25th and said her foot is now starting to do some crazy things. She would like to send you a picture.

## 2017-11-27 NOTE — Telephone Encounter (Signed)
I transferred pt's 4 pictures from my email to the chart. Pt states she is concerned with the middle, 4th and 5th toes on the right foot. I asked pt if they were more contracted in the pictures than at her last office visit and pt states the contracting happened last night. I told pt I would send the message with her pictures to Dr. Samuella CotaPrice.

## 2017-11-28 ENCOUNTER — Ambulatory Visit (INDEPENDENT_AMBULATORY_CARE_PROVIDER_SITE_OTHER): Payer: Medicare Other | Admitting: Podiatry

## 2017-11-28 ENCOUNTER — Ambulatory Visit (INDEPENDENT_AMBULATORY_CARE_PROVIDER_SITE_OTHER): Payer: Medicare Other

## 2017-11-28 DIAGNOSIS — R252 Cramp and spasm: Secondary | ICD-10-CM | POA: Diagnosis not present

## 2017-11-28 DIAGNOSIS — M2042 Other hammer toe(s) (acquired), left foot: Secondary | ICD-10-CM | POA: Diagnosis not present

## 2017-11-28 NOTE — Progress Notes (Signed)
Subjective:  Patient ID: Debbie FlakeKathryn Zacarias, female    DOB: 10/14/1950,  MRN: [id number]  Chief Complaint  Patient presents with  . Hammer Toe    left - foot pain and surgery questions    67 y.o. female presents with questions prior to surgery about her right foot.  States that she experienced a cramping sensation of the right foot with the fifth toe going to the outside of her foot.  States that it happened a couple nights ago.  Sent pictures and placed in the chart.  States that sometimes the toes hurt.  Review of Systems: Negative except as noted in the HPI. Denies N/V/F/Ch.  Past Medical History:  Diagnosis Date  . Anxiety   . Arthritis    bilateral knees  . Depression   . Dysrhythmia    "high heart rate".  seen last Dec 2018  . Headache    resolved  . Hypothyroid   . Insomnia     Current Outpatient Medications:  .  cephALEXin (KEFLEX) 500 MG capsule, Take 1 capsule (500 mg total) by mouth 2 (two) times daily., Disp: 14 capsule, Rfl: 0 .  Cholecalciferol (VITAMIN D3) 10000 units TABS, Take by mouth., Disp: , Rfl:  .  desvenlafaxine (PRISTIQ) 50 MG 24 hr tablet, Take 50 mg by mouth every morning., Disp: , Rfl: 0 .  fluticasone (FLONASE) 50 MCG/ACT nasal spray, instill 1 spray into each nostril once daily, Disp: , Rfl: 0 .  HYDROcodone-acetaminophen (NORCO) 5-325 MG tablet, Take 1 tablet by mouth every 6 (six) hours as needed for moderate pain., Disp: 30 tablet, Rfl: 0 .  L-Methylfolate-Algae (DEPLIN 7.5 PO), Take by mouth., Disp: , Rfl:  .  LIOTHYRONINE SODIUM PO, Take 1 tablet by mouth 2 (two) times daily., Disp: , Rfl:  .  montelukast (SINGULAIR) 10 MG tablet, Take 1 tablet (10 mg total) by mouth at bedtime., Disp: 30 tablet, Rfl: 0 .  Multiple Vitamin (MULTIVITAMIN) capsule, Take 1 capsule by mouth daily., Disp: , Rfl:  .  Multiple Vitamins-Minerals (ZINC PO), Take by mouth., Disp: , Rfl:  .  naproxen (NAPROSYN) 250 MG tablet, Take 250 mg by mouth 2 (two) times daily., Disp:  , Rfl: 1 .  OMEGA-3 FATTY ACIDS PO, Take by mouth., Disp: , Rfl:  .  ondansetron (ZOFRAN) 4 MG tablet, Take 1 tablet (4 mg total) by mouth every 8 (eight) hours as needed for nausea or vomiting., Disp: 20 tablet, Rfl: 0 .  oxyCODONE-acetaminophen (PERCOCET/ROXICET) 5-325 MG tablet, Take 1 tablet by mouth every 4 (four) hours as needed for severe pain., Disp: 20 tablet, Rfl: 0 .  propranolol (INDERAL) 10 MG tablet, Take 10 mg by mouth 2 (two) times daily. , Disp: , Rfl:  .  temazepam (RESTORIL) 15 MG capsule, Take 15 mg by mouth at bedtime. Pt. Takes 30mg  at bedtime, Disp: , Rfl: 0 .  traZODone (DESYREL) 100 MG tablet, Take 300 mg by mouth at bedtime., Disp: , Rfl: 0 .  zinc gluconate 50 MG tablet, Take 50 mg by mouth daily., Disp: , Rfl:   Social History   Tobacco Use  Smoking Status Never Smoker  Smokeless Tobacco Never Used    Allergies  Allergen Reactions  . Symbicort [Budesonide-Formoterol Fumarate] Nausea Only  . Bupropion Rash  . Sulfamethoxazole Rash   Objective:  There were no vitals filed for this visit. There is no height or weight on file to calculate BMI. Constitutional Well developed. Well nourished.  Vascular Dorsalis pedis pulses palpable  bilaterally. Posterior tibial pulses palpable bilaterally. Capillary refill normal to all digits.  No cyanosis or clubbing noted. Pedal hair growth normal.  Neurologic Normal speech. Oriented to person, place, and time. Epicritic sensation to light touch grossly present bilaterally.  Dermatologic Nails well groomed and normal in appearance. No open wounds. No skin lesions.  Orthopedic: Normal joint ROM without pain or crepitus bilaterally. No visible deformities. Hammertoe deformities present second toe left and right second third toes. Right first MPJ rectus with good range of motion no pain on range of motion no tenderness.   Radiographs: None today Assessment:   1. Hammer toe of left foot   2. Foot spasms    Plan:    Patient was evaluated and treated and all questions answered.  Cramping of the right foot -Discussed at this time this is not a worrisome symptom and she should continue to monitor and is likely transient.  We will proceed with planned surgical correction as discussed last visit.

## 2017-11-28 NOTE — Telephone Encounter (Signed)
Attempted to call patient - no answer. Patient apparently was added on for appointment today will discuss.

## 2017-12-03 ENCOUNTER — Encounter: Payer: Self-pay | Admitting: Podiatry

## 2017-12-03 ENCOUNTER — Other Ambulatory Visit: Payer: Self-pay | Admitting: Podiatry

## 2017-12-03 DIAGNOSIS — M2041 Other hammer toe(s) (acquired), right foot: Secondary | ICD-10-CM

## 2017-12-03 DIAGNOSIS — M2042 Other hammer toe(s) (acquired), left foot: Secondary | ICD-10-CM

## 2017-12-03 DIAGNOSIS — M1380 Other specified arthritis, unspecified site: Secondary | ICD-10-CM | POA: Diagnosis not present

## 2017-12-03 MED ORDER — OXYCODONE-ACETAMINOPHEN 5-325 MG PO TABS: 1.0000 | ORAL_TABLET | ORAL | 0 refills | Status: DC | PRN

## 2017-12-03 MED ORDER — CEPHALEXIN 500 MG PO CAPS: 500.0000 mg | ORAL_CAPSULE | Freq: Two times a day (BID) | ORAL | 0 refills | Status: DC

## 2017-12-03 NOTE — Progress Notes (Signed)
DOS 12/03/2017  Correction of hammer toes: left second toe, right second and third toes; all with fixation screw or pin.

## 2017-12-03 NOTE — Progress Notes (Signed)
Patient presented for outpatient surgery today at [facility].  DOS: 12/03/17 Procedures: L 2nd toe hammertoe correction, R 2nd/3rd toe hammertoe correction.

## 2017-12-03 NOTE — Progress Notes (Signed)
Rx sent electronically to patient's pharmacy.

## 2017-12-04 ENCOUNTER — Telehealth: Payer: Self-pay | Admitting: Podiatry

## 2017-12-04 ENCOUNTER — Other Ambulatory Visit: Payer: Self-pay | Admitting: Podiatry

## 2017-12-04 MED ORDER — OXYCODONE-ACETAMINOPHEN 10-325 MG PO TABS: 1.0000 | ORAL_TABLET | ORAL | 0 refills | Status: DC | PRN

## 2017-12-04 NOTE — Progress Notes (Signed)
New rx sent to pharmacy

## 2017-12-04 NOTE — Telephone Encounter (Signed)
Pt is in severe pain. She wanted to know could Dr. Samuella Schultz increase the pain medication.

## 2017-12-04 NOTE — Telephone Encounter (Signed)
I called pt she states she is having sharp burning pain in the 3rd toe, with weight bearing, but is relieved when elevated. I told pt that was typical surgery pain and sometimes just a little change of the outer dressing was enough to relieve. I told pt to remove the cam boot, open-ended sock, ace wrap, and to leave the gauze dressing intact, elevate the foot for 15 minutes then place foot level with the hip and beginning at the toes wrap looser up the foot and leg, reapply the sock and boot. I told pt she should not be up on the foot or below the heart more than 15 minutes/hour, continue to 15 minutes/hour either at the ankle or behind the knee, and to have the boot on at all times including sleeping, but may remove to rest if not going to sleep. I told pt to call with concerns and if Dr. Samuella Cota had further instructions I would call again.

## 2017-12-04 NOTE — Telephone Encounter (Signed)
I informed pt of Dr. Samuella Cota orders for stronger oxycodone not to be taken with the oxycodone ordered yesterday. Pt states after performing the task I had instructed pt states she had immediate relief. I told pt to call with any concerns.

## 2017-12-04 NOTE — Telephone Encounter (Signed)
Agree with all of what you said. I did send a new Rx for stronger percocet. Can you let her know?

## 2017-12-05 ENCOUNTER — Ambulatory Visit (INDEPENDENT_AMBULATORY_CARE_PROVIDER_SITE_OTHER): Payer: Self-pay | Admitting: Podiatry

## 2017-12-05 ENCOUNTER — Encounter: Payer: Self-pay | Admitting: Podiatry

## 2017-12-05 ENCOUNTER — Ambulatory Visit (INDEPENDENT_AMBULATORY_CARE_PROVIDER_SITE_OTHER): Payer: Medicare Other

## 2017-12-05 DIAGNOSIS — M2041 Other hammer toe(s) (acquired), right foot: Secondary | ICD-10-CM | POA: Diagnosis not present

## 2017-12-05 DIAGNOSIS — M2042 Other hammer toe(s) (acquired), left foot: Secondary | ICD-10-CM

## 2017-12-05 NOTE — Progress Notes (Signed)
Subjective:  Patient ID: Debbie Schultz, female    DOB: April 18, 1950,  MRN: [id number]  Chief Complaint  Patient presents with  . Routine Post Op     dos 09.25.2019 Hammertoe Repair 2nd B/L and 3rd Rt w/ Fixation " I am doing ok, managing my pain with Rx pain meds as scheduled"    DOS: 12/03/17 Procedures: L 2nd toe hammertoe correction, R 2nd/3rd toe hammertoe correction.  67 y.o. female returns for post-op check.  Having some pain worse than her previous surgery.  Doing much better now that the oxycodone dose was raised.  Review of Systems: Negative except as noted in the HPI. Denies N/V/F/Ch.  Past Medical History:  Diagnosis Date  . Anxiety   . Arthritis    bilateral knees  . Depression   . Dysrhythmia    "high heart rate".  seen last Dec 2018  . Headache    resolved  . Hypothyroid   . Insomnia     Current Outpatient Medications:  .  cephALEXin (KEFLEX) 500 MG capsule, Take 1 capsule (500 mg total) by mouth 2 (two) times daily., Disp: 14 capsule, Rfl: 0 .  Cholecalciferol (VITAMIN D3) 10000 units TABS, Take by mouth., Disp: , Rfl:  .  desvenlafaxine (PRISTIQ) 50 MG 24 hr tablet, Take 50 mg by mouth every morning., Disp: , Rfl: 0 .  fluticasone (FLONASE) 50 MCG/ACT nasal spray, instill 1 spray into each nostril once daily, Disp: , Rfl: 0 .  HYDROcodone-acetaminophen (NORCO) 5-325 MG tablet, Take 1 tablet by mouth every 6 (six) hours as needed for moderate pain., Disp: 30 tablet, Rfl: 0 .  L-Methylfolate-Algae (DEPLIN 7.5 PO), Take by mouth., Disp: , Rfl:  .  LIOTHYRONINE SODIUM PO, Take 1 tablet by mouth 2 (two) times daily., Disp: , Rfl:  .  montelukast (SINGULAIR) 10 MG tablet, Take 1 tablet (10 mg total) by mouth at bedtime., Disp: 30 tablet, Rfl: 0 .  Multiple Vitamin (MULTIVITAMIN) capsule, Take 1 capsule by mouth daily., Disp: , Rfl:  .  Multiple Vitamins-Minerals (ZINC PO), Take by mouth., Disp: , Rfl:  .  naproxen (NAPROSYN) 250 MG tablet, Take 250 mg by mouth 2  (two) times daily., Disp: , Rfl: 1 .  OMEGA-3 FATTY ACIDS PO, Take by mouth., Disp: , Rfl:  .  ondansetron (ZOFRAN) 4 MG tablet, Take 1 tablet (4 mg total) by mouth every 8 (eight) hours as needed for nausea or vomiting., Disp: 20 tablet, Rfl: 0 .  oxyCODONE-acetaminophen (PERCOCET) 10-325 MG tablet, Take 1 tablet by mouth every 4 (four) hours as needed for pain., Disp: 20 tablet, Rfl: 0 .  propranolol (INDERAL) 10 MG tablet, Take 10 mg by mouth 2 (two) times daily. , Disp: , Rfl:  .  temazepam (RESTORIL) 15 MG capsule, Take 15 mg by mouth at bedtime. Pt. Takes 30mg  at bedtime, Disp: , Rfl: 0 .  traZODone (DESYREL) 100 MG tablet, Take 300 mg by mouth at bedtime., Disp: , Rfl: 0 .  zinc gluconate 50 MG tablet, Take 50 mg by mouth daily., Disp: , Rfl:   Social History   Tobacco Use  Smoking Status Never Smoker  Smokeless Tobacco Never Used    Allergies  Allergen Reactions  . Symbicort [Budesonide-Formoterol Fumarate] Nausea Only  . Bupropion Rash  . Sulfamethoxazole Rash   Objective:  There were no vitals filed for this visit. There is no height or weight on file to calculate BMI. Constitutional Well developed. Well nourished.  Vascular Foot warm and well  perfused. Capillary refill normal to all digits.   Neurologic Normal speech. Oriented to person, place, and time. Epicritic sensation to light touch grossly present bilaterally.  Dermatologic Skin healing well without signs of infection. Skin edges well coapted without signs of infection.  Orthopedic: Tenderness to palpation noted about the surgical site.   Radiographs: Taken reviewed consistent with postop state. Assessment:   1. Hammer toe of left foot   2. Hammer toe of right foot    Plan:  Patient was evaluated and treated and all questions answered.  S/p foot surgery bilaterally -Progressing as expected post-operatively. -XR: Taken and reviewed as above -WB Status: Weight-bear as tolerated in surgical  shoe -Sutures: Left intact today. -Medications: None refilled -Foot redressed.  No follow-ups on file.

## 2017-12-08 ENCOUNTER — Telehealth: Payer: Self-pay | Admitting: Podiatry

## 2017-12-08 MED ORDER — OXYCODONE-ACETAMINOPHEN 10-325 MG PO TABS: 1.0000 | ORAL_TABLET | ORAL | 0 refills | Status: DC | PRN

## 2017-12-08 NOTE — Telephone Encounter (Signed)
Left message stating I would calling to discuss her post op discomfort.

## 2017-12-08 NOTE — Telephone Encounter (Signed)
Pt would like a refill on pain med/[facility] on [address] and [employer]

## 2017-12-08 NOTE — Addendum Note (Signed)
Addended by: Ventura Sellers on: 12/08/2017 11:57 AM   Modules accepted: Orders

## 2017-12-08 NOTE — Telephone Encounter (Signed)
I spoke with pt and she states she is continuing to rest, ice and elevate, I asked on 1-10 pain scale how would she describe her pain. Pt states pain is a 2 when taking the medication and 7 when off the pain medication, but did not want to get addicted. I told pt that Dr. Samuella Cota would monitor the pain medication, but wanted her to be comfortable. I told pt I would inform Dr. Samuella Cota of her request for pain medication and concerns.

## 2017-12-11 ENCOUNTER — Ambulatory Visit (INDEPENDENT_AMBULATORY_CARE_PROVIDER_SITE_OTHER): Payer: Medicare Other | Admitting: Podiatry

## 2017-12-11 DIAGNOSIS — M2042 Other hammer toe(s) (acquired), left foot: Secondary | ICD-10-CM

## 2017-12-11 DIAGNOSIS — M2041 Other hammer toe(s) (acquired), right foot: Secondary | ICD-10-CM

## 2017-12-11 MED ORDER — OXYCODONE-ACETAMINOPHEN 10-325 MG PO TABS: 1.0000 | ORAL_TABLET | ORAL | 0 refills | Status: DC | PRN

## 2017-12-11 NOTE — Progress Notes (Signed)
Subjective:  Patient ID: Debbie Schultz, female    DOB: 1950/12/24,  MRN: [id number]  Chief Complaint  Patient presents with  . Routine Post Op    dos 09.25.2019 Hammertoe Repair 2nd B/L and 3rd Rt w/ Fixation " my toes are feeling alot better"    DOS: 12/03/17 Procedures: L 2nd toe hammertoe correction, R 2nd/3rd toe hammertoe correction.  67 y.o. female returns for post-op check.  Reports swelling in the toes but otherwise is feeling much better.  Review of Systems: Negative except as noted in the HPI. Denies N/V/F/Ch.  Past Medical History:  Diagnosis Date  . Anxiety   . Arthritis    bilateral knees  . Depression   . Dysrhythmia    "high heart rate".  seen last Dec 2018  . Headache    resolved  . Hypothyroid   . Insomnia     Current Outpatient Medications:  .  cephALEXin (KEFLEX) 500 MG capsule, Take 1 capsule (500 mg total) by mouth 2 (two) times daily., Disp: 14 capsule, Rfl: 0 .  Cholecalciferol (VITAMIN D3) 10000 units TABS, Take by mouth., Disp: , Rfl:  .  desvenlafaxine (PRISTIQ) 50 MG 24 hr tablet, Take 50 mg by mouth every morning., Disp: , Rfl: 0 .  fluticasone (FLONASE) 50 MCG/ACT nasal spray, instill 1 spray into each nostril once daily, Disp: , Rfl: 0 .  HYDROcodone-acetaminophen (NORCO) 5-325 MG tablet, Take 1 tablet by mouth every 6 (six) hours as needed for moderate pain., Disp: 30 tablet, Rfl: 0 .  L-Methylfolate-Algae (DEPLIN 7.5 PO), Take by mouth., Disp: , Rfl:  .  LIOTHYRONINE SODIUM PO, Take 1 tablet by mouth 2 (two) times daily., Disp: , Rfl:  .  montelukast (SINGULAIR) 10 MG tablet, Take 1 tablet (10 mg total) by mouth at bedtime., Disp: 30 tablet, Rfl: 0 .  Multiple Vitamin (MULTIVITAMIN) capsule, Take 1 capsule by mouth daily., Disp: , Rfl:  .  Multiple Vitamins-Minerals (ZINC PO), Take by mouth., Disp: , Rfl:  .  naproxen (NAPROSYN) 250 MG tablet, Take 250 mg by mouth 2 (two) times daily., Disp: , Rfl: 1 .  OMEGA-3 FATTY ACIDS PO, Take by mouth.,  Disp: , Rfl:  .  ondansetron (ZOFRAN) 4 MG tablet, Take 1 tablet (4 mg total) by mouth every 8 (eight) hours as needed for nausea or vomiting., Disp: 20 tablet, Rfl: 0 .  oxyCODONE-acetaminophen (PERCOCET) 10-325 MG tablet, Take 1 tablet by mouth every 4 (four) hours as needed for pain., Disp: 20 tablet, Rfl: 0 .  propranolol (INDERAL) 10 MG tablet, Take 10 mg by mouth 2 (two) times daily. , Disp: , Rfl:  .  temazepam (RESTORIL) 15 MG capsule, Take 15 mg by mouth at bedtime. Pt. Takes 30mg  at bedtime, Disp: , Rfl: 0 .  traZODone (DESYREL) 100 MG tablet, Take 300 mg by mouth at bedtime., Disp: , Rfl: 0 .  zinc gluconate 50 MG tablet, Take 50 mg by mouth daily., Disp: , Rfl:   Social History   Tobacco Use  Smoking Status Never Smoker  Smokeless Tobacco Never Used    Allergies  Allergen Reactions  . Symbicort [Budesonide-Formoterol Fumarate] Nausea Only  . Bupropion Rash  . Sulfamethoxazole Rash   Objective:  There were no vitals filed for this visit. There is no height or weight on file to calculate BMI. Constitutional Well developed. Well nourished.  Vascular Foot warm and well perfused. Capillary refill normal to all digits.   Neurologic Normal speech. Oriented to person, place,  and time. Epicritic sensation to light touch grossly present bilaterally.  Dermatologic Skin healing well without signs of infection. Skin edges well coapted without signs of infection.  Orthopedic: Tenderness to palpation noted about the surgical site.   Radiographs: Taken reviewed consistent with postop state. Assessment:   1. Hammer toe of left foot   2. Hammer toe of right foot    Plan:  Patient was evaluated and treated and all questions answered.  S/p foot surgery bilaterally -Progressing as expected post-operatively. -XR: Taken and reviewed as above -WB Status: Weight-bear as tolerated in surgical shoe -Sutures: To be into the left intact for 1 more week -Medications: Percocet  refilled -Foot redressed.  Return in about 1 week (around 12/18/2017).  Suture removal at that time

## 2017-12-19 ENCOUNTER — Ambulatory Visit (INDEPENDENT_AMBULATORY_CARE_PROVIDER_SITE_OTHER): Payer: Medicare Other | Admitting: Podiatry

## 2017-12-19 DIAGNOSIS — Z9889 Other specified postprocedural states: Secondary | ICD-10-CM

## 2017-12-19 MED ORDER — OXYCODONE-ACETAMINOPHEN 10-325 MG PO TABS: 1.0000 | ORAL_TABLET | ORAL | 0 refills | Status: DC | PRN

## 2017-12-19 NOTE — Progress Notes (Signed)
Subjective:  Patient ID: Debbie Schultz, female    DOB: Apr 04, 1950,  MRN: [id number]  No chief complaint on file.  DOS: 12/03/17 Procedures: L 2nd toe hammertoe correction, R 2nd/3rd toe hammertoe correction.  67 y.o. female returns for post-op check.  States toes continue to do better.  Review of Systems: Negative except as noted in the HPI. Denies N/V/F/Ch.  Past Medical History:  Diagnosis Date  . Anxiety   . Arthritis    bilateral knees  . Depression   . Dysrhythmia    "high heart rate".  seen last Dec 2018  . Headache    resolved  . Hypothyroid   . Insomnia     Current Outpatient Medications:  .  cephALEXin (KEFLEX) 500 MG capsule, Take 1 capsule (500 mg total) by mouth 2 (two) times daily., Disp: 14 capsule, Rfl: 0 .  Cholecalciferol (VITAMIN D3) 10000 units TABS, Take by mouth., Disp: , Rfl:  .  desvenlafaxine (PRISTIQ) 50 MG 24 hr tablet, Take 50 mg by mouth every morning., Disp: , Rfl: 0 .  fluticasone (FLONASE) 50 MCG/ACT nasal spray, instill 1 spray into each nostril once daily, Disp: , Rfl: 0 .  HYDROcodone-acetaminophen (NORCO) 5-325 MG tablet, Take 1 tablet by mouth every 6 (six) hours as needed for moderate pain., Disp: 30 tablet, Rfl: 0 .  L-Methylfolate-Algae (DEPLIN 7.5 PO), Take by mouth., Disp: , Rfl:  .  LIOTHYRONINE SODIUM PO, Take 1 tablet by mouth 2 (two) times daily., Disp: , Rfl:  .  montelukast (SINGULAIR) 10 MG tablet, Take 1 tablet (10 mg total) by mouth at bedtime., Disp: 30 tablet, Rfl: 0 .  Multiple Vitamin (MULTIVITAMIN) capsule, Take 1 capsule by mouth daily., Disp: , Rfl:  .  Multiple Vitamins-Minerals (ZINC PO), Take by mouth., Disp: , Rfl:  .  naproxen (NAPROSYN) 250 MG tablet, Take 250 mg by mouth 2 (two) times daily., Disp: , Rfl: 1 .  OMEGA-3 FATTY ACIDS PO, Take by mouth., Disp: , Rfl:  .  ondansetron (ZOFRAN) 4 MG tablet, Take 1 tablet (4 mg total) by mouth every 8 (eight) hours as needed for nausea or vomiting., Disp: 20 tablet, Rfl:  0 .  oxyCODONE-acetaminophen (PERCOCET) 10-325 MG tablet, Take 1 tablet by mouth every 4 (four) hours as needed for pain., Disp: 20 tablet, Rfl: 0 .  propranolol (INDERAL) 10 MG tablet, Take 10 mg by mouth 2 (two) times daily. , Disp: , Rfl:  .  temazepam (RESTORIL) 15 MG capsule, Take 15 mg by mouth at bedtime. Pt. Takes 30mg  at bedtime, Disp: , Rfl: 0 .  traZODone (DESYREL) 100 MG tablet, Take 300 mg by mouth at bedtime., Disp: , Rfl: 0 .  zinc gluconate 50 MG tablet, Take 50 mg by mouth daily., Disp: , Rfl:   Social History   Tobacco Use  Smoking Status Never Smoker  Smokeless Tobacco Never Used    Allergies  Allergen Reactions  . Symbicort [Budesonide-Formoterol Fumarate] Nausea Only  . Bupropion Rash  . Sulfamethoxazole Rash   Objective:  There were no vitals filed for this visit. There is no height or weight on file to calculate BMI. Constitutional Well developed. Well nourished.  Vascular Foot warm and well perfused. Capillary refill normal to all digits.   Neurologic Normal speech. Oriented to person, place, and time. Epicritic sensation to light touch grossly present bilaterally.  Dermatologic Skin well healed with coapted skin sutures.  Orthopedic: Tenderness to palpation noted about the surgical site.   Radiographs: Taken reviewed  consistent with postop state. Assessment:   1. Post-operative state    Plan:  Patient was evaluated and treated and all questions answered.  S/p foot surgery bilaterally -Progressing as expected post-operatively. -XR: Taken and reviewed as above -WB Status: Weight-bear as tolerated in surgical shoe -Sutures: removed. Steris applied. -Medications: Percocet refilled -Foot redressed.  Return in about 2 weeks (around 01/02/2018) for Post-op.  Transition to normal shoegear at that tme.

## 2017-12-26 ENCOUNTER — Other Ambulatory Visit: Payer: Self-pay | Admitting: Internal Medicine

## 2017-12-26 DIAGNOSIS — E2839 Other primary ovarian failure: Secondary | ICD-10-CM

## 2017-12-29 ENCOUNTER — Other Ambulatory Visit: Payer: Self-pay | Admitting: Podiatry

## 2017-12-29 ENCOUNTER — Telehealth: Payer: Self-pay | Admitting: *Deleted

## 2017-12-29 MED ORDER — OXYCODONE-ACETAMINOPHEN 5-325 MG PO TABS: 1.0000 | ORAL_TABLET | ORAL | 0 refills | Status: DC | PRN

## 2017-12-29 NOTE — Telephone Encounter (Signed)
Pt called for refill of pain medication.

## 2017-12-29 NOTE — Telephone Encounter (Signed)
I asked pt to tell me about the pain she was having. Pt states she is having most of the pain in the middle toe, and takes the percocet 10/325 and breaks in 1/2 so she doesn't take as much acetaminophen. I told pt I would inform Dr. Samuella Cota and he would send to the pharmacy.

## 2017-12-29 NOTE — Progress Notes (Signed)
Refilled pain medication but sent over 5/325

## 2017-12-30 MED ORDER — OXYCODONE-ACETAMINOPHEN 10-325 MG PO TABS: 1.0000 | ORAL_TABLET | ORAL | 0 refills | Status: DC | PRN

## 2017-12-30 NOTE — Telephone Encounter (Signed)
I informed pt of Dr. Price's refill. 

## 2018-01-02 ENCOUNTER — Ambulatory Visit (INDEPENDENT_AMBULATORY_CARE_PROVIDER_SITE_OTHER): Payer: Medicare Other

## 2018-01-02 ENCOUNTER — Other Ambulatory Visit: Payer: Self-pay | Admitting: Podiatry

## 2018-01-02 ENCOUNTER — Ambulatory Visit (INDEPENDENT_AMBULATORY_CARE_PROVIDER_SITE_OTHER): Payer: Medicare Other | Admitting: Podiatry

## 2018-01-02 DIAGNOSIS — M2042 Other hammer toe(s) (acquired), left foot: Secondary | ICD-10-CM

## 2018-01-02 DIAGNOSIS — E2839 Other primary ovarian failure: Secondary | ICD-10-CM | POA: Insufficient documentation

## 2018-01-02 DIAGNOSIS — Z1211 Encounter for screening for malignant neoplasm of colon: Secondary | ICD-10-CM | POA: Insufficient documentation

## 2018-01-02 DIAGNOSIS — M2041 Other hammer toe(s) (acquired), right foot: Secondary | ICD-10-CM

## 2018-01-02 DIAGNOSIS — E039 Hypothyroidism, unspecified: Secondary | ICD-10-CM | POA: Insufficient documentation

## 2018-01-02 DIAGNOSIS — F418 Other specified anxiety disorders: Secondary | ICD-10-CM | POA: Insufficient documentation

## 2018-01-02 DIAGNOSIS — I10 Essential (primary) hypertension: Secondary | ICD-10-CM | POA: Insufficient documentation

## 2018-01-02 DIAGNOSIS — M8580 Other specified disorders of bone density and structure, unspecified site: Secondary | ICD-10-CM | POA: Insufficient documentation

## 2018-01-02 MED ORDER — DOXYCYCLINE HYCLATE 100 MG PO TABS: 100.0000 mg | ORAL_TABLET | Freq: Two times a day (BID) | ORAL | 0 refills | Status: DC

## 2018-01-02 NOTE — Progress Notes (Signed)
Subjective:  Patient ID: Debbie Schultz, female    DOB: Oct 14, 1950,  MRN: [id number]  Chief Complaint  Patient presents with  . Foot Problem    Hammertoe repair 2nd B/L and 3rd Right w/ fixation DOS 09.25.19. Pt states healing well and feeling a little better every day.   DOS: 12/03/17 Procedures: L 2nd toe hammertoe correction, R 2nd/3rd toe hammertoe correction.  67 y.o. female returns for post-op check. History as above. Doing well.  Review of Systems: Negative except as noted in the HPI. Denies N/V/F/Ch.  Past Medical History:  Diagnosis Date  . Anxiety   . Arthritis    bilateral knees  . Depression   . Dysrhythmia    "high heart rate".  seen last Dec 2018  . Headache    resolved  . Hypothyroid   . Insomnia     Current Outpatient Medications:  .  gabapentin (NEURONTIN) 100 MG capsule, Take by mouth., Disp: , Rfl:  .  cephALEXin (KEFLEX) 500 MG capsule, Take 1 capsule (500 mg total) by mouth 2 (two) times daily., Disp: 14 capsule, Rfl: 0 .  Cholecalciferol (VITAMIN D3) 10000 units TABS, Take by mouth., Disp: , Rfl:  .  desvenlafaxine (PRISTIQ) 50 MG 24 hr tablet, Take 50 mg by mouth every morning., Disp: , Rfl: 0 .  doxycycline (VIBRA-TABS) 100 MG tablet, Take 1 tablet (100 mg total) by mouth 2 (two) times daily., Disp: 14 tablet, Rfl: 0 .  fluticasone (FLONASE) 50 MCG/ACT nasal spray, instill 1 spray into each nostril once daily, Disp: , Rfl: 0 .  HYDROcodone-acetaminophen (NORCO) 5-325 MG tablet, Take 1 tablet by mouth every 6 (six) hours as needed for moderate pain., Disp: 30 tablet, Rfl: 0 .  L-Methylfolate-Algae (DEPLIN 7.5 PO), Take by mouth., Disp: , Rfl:  .  LIOTHYRONINE SODIUM PO, Take 1 tablet by mouth 2 (two) times daily., Disp: , Rfl:  .  montelukast (SINGULAIR) 10 MG tablet, Take 1 tablet (10 mg total) by mouth at bedtime., Disp: 30 tablet, Rfl: 0 .  Multiple Vitamin (MULTIVITAMIN) capsule, Take 1 capsule by mouth daily., Disp: , Rfl:  .  Multiple  Vitamins-Minerals (ZINC PO), Take by mouth., Disp: , Rfl:  .  naproxen (NAPROSYN) 250 MG tablet, Take 250 mg by mouth 2 (two) times daily., Disp: , Rfl: 1 .  OMEGA-3 FATTY ACIDS PO, Take by mouth., Disp: , Rfl:  .  ondansetron (ZOFRAN) 4 MG tablet, Take 1 tablet (4 mg total) by mouth every 8 (eight) hours as needed for nausea or vomiting., Disp: 20 tablet, Rfl: 0 .  oxyCODONE-acetaminophen (PERCOCET) 10-325 MG tablet, Take 1 tablet by mouth every 4 (four) hours as needed for pain., Disp: 20 tablet, Rfl: 0 .  Pediatric Multiple Vit-C-FA (MULTIVITAMIN ANIMAL SHAPES, WITH CA/FA,) with C & FA chewable tablet, Chew by mouth., Disp: , Rfl:  .  propranolol (INDERAL) 10 MG tablet, Take 10 mg by mouth 2 (two) times daily. , Disp: , Rfl:  .  temazepam (RESTORIL) 15 MG capsule, Take 15 mg by mouth at bedtime. Pt. Takes 30mg  at bedtime, Disp: , Rfl: 0 .  traZODone (DESYREL) 100 MG tablet, Take 300 mg by mouth at bedtime., Disp: , Rfl: 0 .  zinc gluconate 50 MG tablet, Take 50 mg by mouth daily., Disp: , Rfl:   Social History   Tobacco Use  Smoking Status Never Smoker  Smokeless Tobacco Never Used    Allergies  Allergen Reactions  . Symbicort [Budesonide-Formoterol Fumarate] Nausea Only  .  Bupropion Rash  . Sulfamethoxazole Rash   Objective:  There were no vitals filed for this visit. There is no height or weight on file to calculate BMI. Constitutional Well developed. Well nourished.  Vascular Foot warm and well perfused. Capillary refill normal to all digits.   Neurologic Normal speech. Oriented to person, place, and time. Epicritic sensation to light touch grossly present bilaterally.  Dermatologic Skin well healed bilateral second toe. Small superficial dehiscence right 3rd toe with fibrotic base slight periwound erythema.  Orthopedic: Tenderness to palpation noted about the right 3rd toe, other toes without pain.   Radiographs: Taken reviewed consistent with postop state. No acute  fractures. No osseous erosions. Assessment:   1. Hammertoe of left foot   2. Hammertoe of right foot    Plan:  Patient was evaluated and treated and all questions answered.  S/p foot surgery bilaterally  -XR: Taken and reviewed as above -WB Status: Weight-bear as tolerated in surgical shoe -R 3rd toe small superficial dehiscence. Fibrotic base. No probe to bone. -Medications: Rx for doxycycline for slight periwound erythema. -Foot redressed.  Return in about 1 week (around 01/09/2018) for wound check.

## 2018-01-06 ENCOUNTER — Telehealth: Payer: Self-pay | Admitting: Podiatry

## 2018-01-06 NOTE — Telephone Encounter (Signed)
I need a refill of the oxycodone 10-325 prescription. I use the [facility] on [address] and [employer] Thank you.

## 2018-01-09 ENCOUNTER — Ambulatory Visit (INDEPENDENT_AMBULATORY_CARE_PROVIDER_SITE_OTHER): Payer: Self-pay

## 2018-01-09 DIAGNOSIS — M2042 Other hammer toe(s) (acquired), left foot: Secondary | ICD-10-CM

## 2018-01-09 DIAGNOSIS — M2041 Other hammer toe(s) (acquired), right foot: Secondary | ICD-10-CM

## 2018-01-09 MED ORDER — OXYCODONE-ACETAMINOPHEN 10-325 MG PO TABS: 1.0000 | ORAL_TABLET | ORAL | 0 refills | Status: DC | PRN

## 2018-01-14 NOTE — Progress Notes (Signed)
Patient is here today for follow-up appointment, surgery performed on 12/03/2017: Hammertoe repair second bilateral and third right with fixation.  She states that her toes are feeling really good, she continues to keep the areas clean and applying Neosporin with a bandage.  Noted well-healing surgical sites, reduction in redness.  There were no open wounds, no erythema, no redness, no drainage, no signs and symptoms of infection.  She says she still gets a little soreness in her toes from time to time.    Cleansed areas with normal saline, applied Neosporin and a bandage.  Refilled oxycodone per verbal order from Dr. Charlsie Merles.  Patient is to follow-up in 2 weeks to be checked by Dr. Samuella Cota or sooner if acute symptoms arise.

## 2018-01-19 ENCOUNTER — Telehealth: Payer: Self-pay | Admitting: Podiatry

## 2018-01-19 NOTE — Telephone Encounter (Signed)
I called pt, asked pt how her feet and toes were doing. Pt states as her feet get better she is doing more and has to take 1/2 Percocet 10mg  about 4 times a day but not if she doesn't need it. Pt states she would like to Percocet 10/325mg  so when she halves she has less tylenol.

## 2018-01-19 NOTE — Telephone Encounter (Signed)
Pt called requesting a refill on her pain medication. She has an appt scheduled for 11/14 for follow up. Please call patient.

## 2018-01-20 MED ORDER — OXYCODONE-ACETAMINOPHEN 10-325 MG PO TABS: 1.0000 | ORAL_TABLET | ORAL | 0 refills | Status: DC | PRN

## 2018-01-20 NOTE — Telephone Encounter (Signed)
I informed pt the percocet had been sent to her pharmacy.

## 2018-01-20 NOTE — Addendum Note (Signed)
Addended by: Ventura SellersPRICE, Patrisia Faeth on: 01/20/2018 08:40 AM   Modules accepted: Orders

## 2018-01-22 ENCOUNTER — Encounter: Payer: Self-pay | Admitting: Podiatry

## 2018-01-22 ENCOUNTER — Ambulatory Visit (INDEPENDENT_AMBULATORY_CARE_PROVIDER_SITE_OTHER): Payer: Self-pay | Admitting: Podiatry

## 2018-01-22 DIAGNOSIS — M2041 Other hammer toe(s) (acquired), right foot: Secondary | ICD-10-CM

## 2018-01-22 DIAGNOSIS — M2042 Other hammer toe(s) (acquired), left foot: Secondary | ICD-10-CM

## 2018-01-22 DIAGNOSIS — Z9889 Other specified postprocedural states: Secondary | ICD-10-CM

## 2018-02-02 NOTE — Progress Notes (Signed)
Subjective:  Patient ID: Debbie FlakeKathryn Schultz, female    DOB: 12/15/1950,  MRN: [id number]  Chief Complaint  Patient presents with  . Routine Post Op    DOS: 12-03-2017 Hammertoe Repair 2nd B/L and 3rd Rt; pt stated, "doing good, just a little sore at times and swollen; no new concerns"   DOS: 12/03/17 Procedures: L 2nd toe hammertoe correction, R 2nd/3rd toe hammertoe correction.  67 y.o. female returns for post-op check. History as above.   Review of Systems: Negative except as noted in the HPI. Denies N/V/F/Ch.  Past Medical History:  Diagnosis Date  . Anxiety   . Arthritis    bilateral knees  . Depression   . Dysrhythmia    "high heart rate".  seen last Dec 2018  . Headache    resolved  . Hypothyroid   . Insomnia     Current Outpatient Medications:  .  cephALEXin (KEFLEX) 500 MG capsule, Take 1 capsule (500 mg total) by mouth 2 (two) times daily., Disp: 14 capsule, Rfl: 0 .  Cholecalciferol (VITAMIN D3) 10000 units TABS, Take by mouth., Disp: , Rfl:  .  desvenlafaxine (PRISTIQ) 50 MG 24 hr tablet, Take 50 mg by mouth every morning., Disp: , Rfl: 0 .  doxycycline (VIBRA-TABS) 100 MG tablet, Take 1 tablet (100 mg total) by mouth 2 (two) times daily., Disp: 14 tablet, Rfl: 0 .  fluticasone (FLONASE) 50 MCG/ACT nasal spray, instill 1 spray into each nostril once daily, Disp: , Rfl: 0 .  gabapentin (NEURONTIN) 100 MG capsule, Take by mouth., Disp: , Rfl:  .  HYDROcodone-acetaminophen (NORCO) 5-325 MG tablet, Take 1 tablet by mouth every 6 (six) hours as needed for moderate pain., Disp: 30 tablet, Rfl: 0 .  L-Methylfolate-Algae (DEPLIN 7.5 PO), Take by mouth., Disp: , Rfl:  .  LIOTHYRONINE SODIUM PO, Take 1 tablet by mouth 2 (two) times daily., Disp: , Rfl:  .  montelukast (SINGULAIR) 10 MG tablet, Take 1 tablet (10 mg total) by mouth at bedtime., Disp: 30 tablet, Rfl: 0 .  Multiple Vitamin (MULTIVITAMIN) capsule, Take 1 capsule by mouth daily., Disp: , Rfl:  .  Multiple  Vitamins-Minerals (ZINC PO), Take by mouth., Disp: , Rfl:  .  naproxen (NAPROSYN) 250 MG tablet, Take 250 mg by mouth 2 (two) times daily., Disp: , Rfl: 1 .  OMEGA-3 FATTY ACIDS PO, Take by mouth., Disp: , Rfl:  .  ondansetron (ZOFRAN) 4 MG tablet, Take 1 tablet (4 mg total) by mouth every 8 (eight) hours as needed for nausea or vomiting., Disp: 20 tablet, Rfl: 0 .  oxyCODONE-acetaminophen (PERCOCET) 10-325 MG tablet, Take 1 tablet by mouth every 4 (four) hours as needed for pain., Disp: 20 tablet, Rfl: 0 .  Pediatric Multiple Vit-C-FA (MULTIVITAMIN ANIMAL SHAPES, WITH CA/FA,) with C & FA chewable tablet, Chew by mouth., Disp: , Rfl:  .  propranolol (INDERAL) 10 MG tablet, Take 10 mg by mouth 2 (two) times daily. , Disp: , Rfl:  .  temazepam (RESTORIL) 15 MG capsule, Take 15 mg by mouth at bedtime. Pt. Takes 30mg  at bedtime, Disp: , Rfl: 0 .  traZODone (DESYREL) 100 MG tablet, Take 300 mg by mouth at bedtime., Disp: , Rfl: 0 .  zinc gluconate 50 MG tablet, Take 50 mg by mouth daily., Disp: , Rfl:   Social History   Tobacco Use  Smoking Status Never Smoker  Smokeless Tobacco Never Used    Allergies  Allergen Reactions  . Symbicort [Budesonide-Formoterol Fumarate] Nausea Only  .  Bupropion Rash  . Sulfamethoxazole Rash   Objective:  There were no vitals filed for this visit. There is no height or weight on file to calculate BMI. Constitutional Well developed. Well nourished.  Vascular Foot warm and well perfused. Capillary refill normal to all digits.   Neurologic Normal speech. Oriented to person, place, and time. Epicritic sensation to light touch grossly present bilaterally.  Dermatologic Skin well healed all incisions.  Orthopedic: No tenderness to palpation   Radiographs: None Assessment:   1. Hammertoe of left foot   2. Hammertoe of right foot   3. Post-operative state    Plan:  Patient was evaluated and treated and all questions answered.  S/p foot surgery  bilaterally  -XR: Taken and reviewed as above -WB Status: Transition to shoegear of choice. -All wounds healed.  Return in about 5 weeks (around 02/26/2018) for Hammertoe f/u bilat.

## 2018-02-09 ENCOUNTER — Telehealth: Payer: Self-pay | Admitting: Podiatry

## 2018-02-09 MED ORDER — OXYCODONE-ACETAMINOPHEN 10-325 MG PO TABS: 1.0000 | ORAL_TABLET | ORAL | 0 refills | Status: DC | PRN

## 2018-02-09 NOTE — Telephone Encounter (Signed)
Pt called requesting refill on  Oxycodone °

## 2018-02-09 NOTE — Telephone Encounter (Signed)
Left message informing pt the rx had been sent to the pharmacy. 

## 2018-02-09 NOTE — Telephone Encounter (Signed)
I called pt, she states the middle toe on the right foot has sharp pain and she has tried to manage with tylenol but she has found it only makes her sleepy but doesn't cover the pain. I asked pt if she had become more active and she states yes and she is wearing a regular sandal. I told pt that she really should not be up on the foot more than 30 minutes/hour and may have too much bend in the regular sandal than is necessary at this time in her recovery. Pt states Dr. Samuella CotaPrice has seen the sandal and didn't say anything. I told pt that she could wear the regular sandal until it is uncomfortable then go back into the surgical sandal, she would eventually get into the regular sandal it would just be a little slower. Pt states she would still like the percocet 10, because she can break in 1/2 and not get as much tylenol.

## 2018-02-09 NOTE — Addendum Note (Signed)
Addended by: Ventura SellersPRICE, Finn Altemose on: 02/09/2018 03:53 PM   Modules accepted: Orders

## 2018-02-12 DIAGNOSIS — Z23 Encounter for immunization: Secondary | ICD-10-CM | POA: Diagnosis not present

## 2018-02-23 ENCOUNTER — Telehealth: Payer: Self-pay | Admitting: *Deleted

## 2018-02-23 NOTE — Telephone Encounter (Signed)
Pt states she needs to cancel the appt for Thursday, she will be on her way home from [place]. Pt states she would like to send a photo of the toe, which is still swollen and have a phone consultation.

## 2018-02-24 NOTE — Telephone Encounter (Signed)
I informed pt of Dr. Kandice HamsPrice's recommendations, and my email.

## 2018-02-24 NOTE — Telephone Encounter (Signed)
If you can get the photo from her I can call her and we can do a phone consult

## 2018-02-24 NOTE — Telephone Encounter (Signed)
Received email and forwarded to Dr. Samuella CotaPrice.

## 2018-02-26 ENCOUNTER — Encounter

## 2018-02-26 ENCOUNTER — Encounter: Payer: Self-pay | Admitting: Podiatry

## 2018-03-04 ENCOUNTER — Encounter: Payer: Self-pay | Admitting: Emergency Medicine

## 2018-03-04 DIAGNOSIS — F3342 Major depressive disorder, recurrent, in full remission: Secondary | ICD-10-CM | POA: Insufficient documentation

## 2018-03-09 ENCOUNTER — Inpatient Hospital Stay
Admission: RE | Admit: 2018-03-09 | Discharge: 2018-03-09 | Disposition: A | Discharge status: Home / Self Care | Payer: Medicare Other | Source: Ambulatory Visit | Attending: Internal Medicine | Admitting: Internal Medicine

## 2018-03-19 ENCOUNTER — Encounter (HOSPITAL_COMMUNITY): Payer: Self-pay | Admitting: Emergency Medicine

## 2018-03-19 ENCOUNTER — Emergency Department (HOSPITAL_COMMUNITY)
Admission: EM | Admit: 2018-03-19 | Discharge: 2018-03-19 | Disposition: A | Discharge status: Home / Self Care | Payer: Medicare Other | Attending: Emergency Medicine | Admitting: Emergency Medicine

## 2018-03-19 ENCOUNTER — Other Ambulatory Visit: Payer: Self-pay

## 2018-03-19 DIAGNOSIS — Z5321 Procedure and treatment not carried out due to patient leaving prior to being seen by health care provider: Secondary | ICD-10-CM | POA: Insufficient documentation

## 2018-03-19 DIAGNOSIS — M79605 Pain in left leg: Secondary | ICD-10-CM | POA: Insufficient documentation

## 2018-03-19 NOTE — ED Triage Notes (Signed)
Patient here with lower left leg pain, states she has had foot surgery 6 months ago.  She has an area that is red and warm to the touch.  Pain with walking.

## 2018-03-19 NOTE — ED Notes (Signed)
Pt states that she is leaving because her mother is on the fourth floor "taking her last breaths" and she wants to be with her.

## 2018-03-20 ENCOUNTER — Other Ambulatory Visit (HOSPITAL_COMMUNITY): Payer: Self-pay | Admitting: Geriatric Medicine

## 2018-03-20 ENCOUNTER — Ambulatory Visit (HOSPITAL_COMMUNITY)
Admission: RE | Admit: 2018-03-20 | Discharge: 2018-03-20 | Disposition: A | Discharge status: Home / Self Care | Payer: Medicare Other | Source: Ambulatory Visit | Attending: Geriatric Medicine | Admitting: Geriatric Medicine

## 2018-03-20 DIAGNOSIS — M7989 Other specified soft tissue disorders: Principal | ICD-10-CM

## 2018-03-20 DIAGNOSIS — M79662 Pain in left lower leg: Secondary | ICD-10-CM | POA: Diagnosis not present

## 2018-03-20 DIAGNOSIS — R936 Abnormal findings on diagnostic imaging of limbs: Secondary | ICD-10-CM | POA: Diagnosis not present

## 2018-03-20 DIAGNOSIS — M79605 Pain in left leg: Secondary | ICD-10-CM | POA: Insufficient documentation

## 2018-03-20 LAB — CBC WITH DIFFERENTIAL/PLATELET
Abs Immature Granulocytes: 0.04 10*3/uL (ref 0.00–0.07)
BASOS ABS: 0.1 10*3/uL (ref 0.0–0.1)
BASOS PCT: 1 %
EOS ABS: 0.2 10*3/uL (ref 0.0–0.5)
Eosinophils Relative: 3 %
HEMATOCRIT: 43.3 % (ref 36.0–46.0)
Hemoglobin: 14.2 g/dL (ref 12.0–15.0)
IMMATURE GRANULOCYTES: 1 %
LYMPHS ABS: 2.2 10*3/uL (ref 0.7–4.0)
Lymphocytes Relative: 30 %
MCH: 31 pg (ref 26.0–34.0)
MCHC: 32.8 g/dL (ref 30.0–36.0)
MCV: 94.5 fL (ref 80.0–100.0)
MONOS PCT: 7 %
Monocytes Absolute: 0.6 10*3/uL (ref 0.1–1.0)
NEUTROS PCT: 58 %
NRBC: 0 % (ref 0.0–0.2)
Neutro Abs: 4.3 10*3/uL (ref 1.7–7.7)
PLATELETS: 283 10*3/uL (ref 150–400)
RBC: 4.58 MIL/uL (ref 3.87–5.11)
RDW: 11.9 % (ref 11.5–15.5)
WBC: 7.4 10*3/uL (ref 4.0–10.5)

## 2018-03-20 LAB — BASIC METABOLIC PANEL
ANION GAP: 9 (ref 5–15)
BUN: 13 mg/dL (ref 8–23)
CALCIUM: 9 mg/dL (ref 8.9–10.3)
CO2: 21 mmol/L — AB (ref 22–32)
Chloride: 110 mmol/L (ref 98–111)
Creatinine, Ser: 0.99 mg/dL (ref 0.44–1.00)
GFR calc Af Amer: >60 mL/min (ref >60)
GFR calc non Af Amer: 59 mL/min — ABNORMAL LOW (ref >60)
GLUCOSE: 171 mg/dL — AB (ref 70–99)
Potassium: 3.4 mmol/L — ABNORMAL LOW (ref 3.5–5.1)
Sodium: 140 mmol/L (ref 135–145)

## 2018-03-20 NOTE — Progress Notes (Signed)
*  Preliminary Results* Left lower extremity venous duplex completed. Left lower extremity is negative for deep vein thrombosis and positive for SVT in the GSV. There is no evidence of left Baker's cyst.  03/20/2018 3:18 PM  Natashia Roseman Clare Gandy

## 2018-03-26 ENCOUNTER — Ambulatory Visit (INDEPENDENT_AMBULATORY_CARE_PROVIDER_SITE_OTHER): Payer: Medicare Other | Admitting: Psychiatry

## 2018-03-26 ENCOUNTER — Encounter: Payer: Self-pay | Admitting: Psychiatry

## 2018-03-26 VITALS — BP 137/87 | HR 105

## 2018-03-26 DIAGNOSIS — F5101 Primary insomnia: Secondary | ICD-10-CM

## 2018-03-26 DIAGNOSIS — F3342 Major depressive disorder, recurrent, in full remission: Secondary | ICD-10-CM | POA: Diagnosis not present

## 2018-03-26 MED ORDER — TEMAZEPAM 30 MG PO CAPS: 30.0000 mg | ORAL_CAPSULE | Freq: Every day | ORAL | 2 refills | Status: DC

## 2018-03-26 MED ORDER — DOXEPIN HCL 3 MG PO TABS: 3.0000 mg | ORAL_TABLET | Freq: Every evening | ORAL | 2 refills | Status: DC | PRN

## 2018-03-26 MED ORDER — DESVENLAFAXINE SUCCINATE ER 100 MG PO TB24: 100.0000 mg | ORAL_TABLET | ORAL | 2 refills | Status: DC

## 2018-03-26 NOTE — Progress Notes (Signed)
Debbie Schultz [id number] [birth year] 68 y.o.  Subjective:   Patient ID:  Debbie Schultz is a 68 y.o. (DOB [birth year]) female.  Chief Complaint:  Chief Complaint  Patient presents with  . Insomnia  . Follow-up    HPI Debbie Schultz presents to the office today for follow-up of depression and insomnia.  She reports "in general I am doing ok." She reports, "Mom died last 2022/07/29" after week long hospitalization for pneumonia and pulmonary emboli. Reports that there have been multiple meetings to handle mother's final affairs and reports that mother had made thorough preparations. Reports that she is grieving the loss of her mother and reports that she perceives that grief is appropriate- "I think I will be ok." Reports that mood has been stable overall since last visit. Denies anxiety. Reports adequate sleep. She reports that it takes her about an hour to fall asleep. Appetite has been "too good." Energy and motivation has been fair. Reports less motivation to do things will recuperating from foot surgery. Concentration has been adequate. Denies SI.    Review of Systems:  Review of Systems  HENT:       Recovering from recent URI.   Musculoskeletal: Negative for gait problem.  Neurological: Negative for tremors.  Psychiatric/Behavioral:       Please refer to HPI    Medications: I have reviewed the patient's current medications.  Current Outpatient Medications  Medication Sig Dispense Refill  . Cholecalciferol (VITAMIN D3) 10000 units TABS Take by mouth.    . desvenlafaxine (PRISTIQ) 100 MG 24 hr tablet Take 1 tablet (100 mg total) by mouth every morning. 30 tablet 2  . fluticasone (FLONASE) 50 MCG/ACT nasal spray instill 1 spray into each nostril once daily  0  . L-Methylfolate-Algae (DEPLIN 15 PO) Take 15 mg by mouth daily.    Marland Kitchen LIOTHYRONINE SODIUM PO Take 1 tablet by mouth daily.     . Multiple Vitamin (MULTIVITAMIN) capsule Take 1 capsule by mouth daily.    . Multiple  Vitamins-Minerals (ZINC PO) Take by mouth.    . OMEGA-3 FATTY ACIDS PO Take by mouth.    . propranolol (INDERAL) 10 MG tablet Take 10 mg by mouth 2 (two) times daily.     . temazepam (RESTORIL) 30 MG capsule Take 1 capsule (30 mg total) by mouth at bedtime. 90 capsule 2  . traZODone (DESYREL) 100 MG tablet Take 100 mg by mouth at bedtime.   0  . zinc gluconate 50 MG tablet Take 50 mg by mouth daily.    . Doxepin HCl (SILENOR) 3 MG TABS Take 1 tablet (3 mg total) by mouth at bedtime as needed. 90 tablet 2  . HYDROcodone-acetaminophen (NORCO) 5-325 MG tablet Take 1 tablet by mouth every 6 (six) hours as needed for moderate pain. (Patient not taking: Reported on 03/26/2018) 30 tablet 0  . L-Methylfolate-Algae (DEPLIN 7.5 PO) Take by mouth.    . oxyCODONE-acetaminophen (PERCOCET) 10-325 MG tablet Take 1 tablet by mouth every 4 (four) hours as needed for pain. (Patient not taking: Reported on 03/26/2018) 20 tablet 0   No current facility-administered medications for this visit.     Medication Side Effects: None  Allergies:  Allergies  Allergen Reactions  . Symbicort [Budesonide-Formoterol Fumarate] Nausea Only  . Bupropion Rash  . Sulfamethoxazole Rash    Past Medical History:  Diagnosis Date  . Anxiety   . Arthritis    bilateral knees  . Back pain   . Depression   . Dysrhythmia    "  high heart rate".  seen last Dec 2018  . Headache    resolved  . Hypothyroid   . Insomnia     Family History  Problem Relation Age of Onset  . Heart disease Mother        PPM  . Heart attack Father   . Diabetes Maternal Aunt   . Stroke Paternal Grandmother   . Headache Neg Hx     Social History   Socioeconomic History  . Marital status: Unknown    Spouse name: Not on file  . Number of children: 0  . Years of education: Masters  . Highest education level: Not on file  Occupational History  . Occupation: Retired    Comment: Company secretaryteacher  Social Needs  . Financial resource strain: Not on file   . Food insecurity:    Worry: Not on file    Inability: Not on file  . Transportation needs:    Medical: Not on file    Non-medical: Not on file  Tobacco Use  . Smoking status: Never Smoker  . Smokeless tobacco: Never Used  Substance and Sexual Activity  . Alcohol use: Yes    Alcohol/week: 0.0 standard drinks    Comment: once a month  . Drug use: No  . Sexual activity: Not on file  Lifestyle  . Physical activity:    Days per week: Not on file    Minutes per session: Not on file  . Stress: Not on file  Relationships  . Social connections:    Talks on phone: Not on file    Gets together: Not on file    Attends religious service: Not on file    Active member of club or organization: Not on file    Attends meetings of clubs or organizations: Not on file    Relationship status: Not on file  . Intimate partner violence:    Fear of current or ex partner: Not on file    Emotionally abused: Not on file    Physically abused: Not on file    Forced sexual activity: Not on file  Other Topics Concern  . Not on file  Social History Narrative   Lives with mother.   Caffeine use: 1 cup coffee every morning   Tea rare    Past Medical History, Surgical history, Social history, and Family history were reviewed and updated as appropriate.   Please see review of systems for further details on the patient's review from today.   Objective:   Physical Exam:  BP 137/87   Pulse (!) 105   Physical Exam Constitutional:      General: She is not in acute distress.    Appearance: She is well-developed.  Musculoskeletal:        General: No deformity.  Neurological:     Mental Status: She is alert and oriented to person, place, and time.     Coordination: Coordination normal.  Psychiatric:        Mood and Affect: Mood is not anxious or depressed. Affect is not labile, blunt, angry or inappropriate.        Speech: Speech normal.        Behavior: Behavior normal.        Thought Content:  Thought content normal. Thought content does not include homicidal or suicidal ideation. Thought content does not include homicidal or suicidal plan.        Judgment: Judgment normal.     Comments: Mood is appropriate to content.  Affect congruent. Insight intact. No auditory or visual hallucinations. No delusions.      Lab Review:     Component Value Date/Time   NA 140 03/19/2018 2330   K 3.4 (L) 03/19/2018 2330   CL 110 03/19/2018 2330   CO2 21 (L) 03/19/2018 2330   GLUCOSE 171 (H) 03/19/2018 2330   BUN 13 03/19/2018 2330   CREATININE 0.99 03/19/2018 2330   CALCIUM 9.0 03/19/2018 2330   PROT 6.6 11/29/2015 1456   ALBUMIN 3.9 11/29/2015 1456   AST 17 11/29/2015 1456   ALT 34 11/29/2015 1456   ALKPHOS 70 11/29/2015 1456   BILITOT 0.3 11/29/2015 1456   GFRNONAA 59 (L) 03/19/2018 2330   GFRAA >60 03/19/2018 2330       Component Value Date/Time   WBC 7.4 03/19/2018 2330   RBC 4.58 03/19/2018 2330   HGB 14.2 03/19/2018 2330   HCT 43.3 03/19/2018 2330   PLT 283 03/19/2018 2330   MCV 94.5 03/19/2018 2330   MCH 31.0 03/19/2018 2330   MCHC 32.8 03/19/2018 2330   RDW 11.9 03/19/2018 2330   LYMPHSABS 2.2 03/19/2018 2330   MONOABS 0.6 03/19/2018 2330   EOSABS 0.2 03/19/2018 2330   BASOSABS 0.1 03/19/2018 2330    No results found for: POCLITH, LITHIUM   No results found for: PHENYTOIN, PHENOBARB, VALPROATE, CBMZ   .res Assessment: Plan:   Will restart Silenor 3 mg p.o. nightly since patient reports some difficulty with sleep initiation. Will continue to temazepam 30 mg p.o. nightly for insomnia. Will continue Pristiq 100 mg p.o. every morning since depressive signs and symptoms remain in remission. Primary insomnia - Plan: Doxepin HCl (SILENOR) 3 MG TABS, temazepam (RESTORIL) 30 MG capsule  Recurrent major depressive disorder, in full remission (HCC) - Plan: desvenlafaxine (PRISTIQ) 100 MG 24 hr tablet  Please see After Visit Summary for patient specific  instructions.  Future Appointments  Date Time Provider Department Center  08/20/2018 10:00 AM Corie Chiquito, PMHNP [facility] None    No orders of the defined types were placed in this encounter.     -------------------------------

## 2018-04-16 DIAGNOSIS — Z0000 Encounter for general adult medical examination without abnormal findings: Secondary | ICD-10-CM | POA: Diagnosis not present

## 2018-04-16 DIAGNOSIS — E039 Hypothyroidism, unspecified: Secondary | ICD-10-CM | POA: Diagnosis not present

## 2018-04-16 DIAGNOSIS — F418 Other specified anxiety disorders: Secondary | ICD-10-CM | POA: Diagnosis not present

## 2018-04-16 DIAGNOSIS — Z1389 Encounter for screening for other disorder: Secondary | ICD-10-CM | POA: Diagnosis not present

## 2018-04-17 ENCOUNTER — Other Ambulatory Visit: Payer: Self-pay | Admitting: Internal Medicine

## 2018-04-17 DIAGNOSIS — E2839 Other primary ovarian failure: Secondary | ICD-10-CM

## 2018-04-28 DIAGNOSIS — R21 Rash and other nonspecific skin eruption: Secondary | ICD-10-CM | POA: Diagnosis not present

## 2018-05-07 ENCOUNTER — Ambulatory Visit (INDEPENDENT_AMBULATORY_CARE_PROVIDER_SITE_OTHER): Payer: Medicare Other

## 2018-05-07 ENCOUNTER — Ambulatory Visit: Payer: Medicare Other | Admitting: Podiatry

## 2018-05-07 DIAGNOSIS — M2042 Other hammer toe(s) (acquired), left foot: Secondary | ICD-10-CM

## 2018-05-07 DIAGNOSIS — M2041 Other hammer toe(s) (acquired), right foot: Secondary | ICD-10-CM

## 2018-05-07 DIAGNOSIS — Z9889 Other specified postprocedural states: Secondary | ICD-10-CM

## 2018-05-09 NOTE — Progress Notes (Signed)
Subjective:  Patient ID: Debbie Schultz, female    DOB: 1950/10/05,  MRN: [id number]  Chief Complaint  Patient presents with  . Routine Post Op    DOS: 12-03-2017 Hammertoe Repair 2nd B/L and 3rd Rt. Pt states left is fine, right foot toes are staying swollen and difficult to walk with after a short while.     68 y.o. female presents with the above complaint.  Above history reviewed with patient  Review of Systems: Negative except as noted in the HPI. Denies N/V/F/Ch.  Past Medical History:  Diagnosis Date  . Anxiety   . Arthritis    bilateral knees  . Back pain   . Depression   . Dysrhythmia    "high heart rate".  seen last Dec 2018  . Headache    resolved  . Hypothyroid   . Insomnia     Current Outpatient Medications:  .  Cholecalciferol (VITAMIN D3) 10000 units TABS, Take by mouth., Disp: , Rfl:  .  clotrimazole-betamethasone (LOTRISONE) cream, , Disp: , Rfl:  .  desvenlafaxine (PRISTIQ) 100 MG 24 hr tablet, Take 1 tablet (100 mg total) by mouth every morning., Disp: 30 tablet, Rfl: 2 .  Doxepin HCl (SILENOR) 3 MG TABS, Take 1 tablet (3 mg total) by mouth at bedtime as needed., Disp: 90 tablet, Rfl: 2 .  fluticasone (FLONASE) 50 MCG/ACT nasal spray, instill 1 spray into each nostril once daily, Disp: , Rfl: 0 .  HYDROcodone-acetaminophen (NORCO) 5-325 MG tablet, Take 1 tablet by mouth every 6 (six) hours as needed for moderate pain., Disp: 30 tablet, Rfl: 0 .  L-Methylfolate-Algae (DEPLIN 15 PO), Take 15 mg by mouth daily., Disp: , Rfl:  .  L-Methylfolate-Algae (DEPLIN 7.5 PO), Take by mouth., Disp: , Rfl:  .  liothyronine (CYTOMEL) 5 MCG tablet, TK 2 TS PO QAM, Disp: , Rfl:  .  LIOTHYRONINE SODIUM PO, Take 1 tablet by mouth daily. , Disp: , Rfl:  .  Multiple Vitamin (MULTIVITAMIN) capsule, Take 1 capsule by mouth daily., Disp: , Rfl:  .  Multiple Vitamins-Minerals (ZINC PO), Take by mouth., Disp: , Rfl:  .  OMEGA-3 FATTY ACIDS PO, Take by mouth., Disp: , Rfl:  .   oxyCODONE-acetaminophen (PERCOCET) 10-325 MG tablet, Take 1 tablet by mouth every 4 (four) hours as needed for pain., Disp: 20 tablet, Rfl: 0 .  propranolol (INDERAL) 10 MG tablet, Take 10 mg by mouth 2 (two) times daily. , Disp: , Rfl:  .  temazepam (RESTORIL) 30 MG capsule, Take 1 capsule (30 mg total) by mouth at bedtime., Disp: 90 capsule, Rfl: 2 .  traZODone (DESYREL) 100 MG tablet, Take 100 mg by mouth at bedtime. , Disp: , Rfl: 0 .  XARELTO 15 MG TABS tablet, TK 1 T PO QD WF, Disp: , Rfl:  .  zinc gluconate 50 MG tablet, Take 50 mg by mouth daily., Disp: , Rfl:   Social History   Tobacco Use  Smoking Status Never Smoker  Smokeless Tobacco Never Used    Allergies  Allergen Reactions  . Symbicort [Budesonide-Formoterol Fumarate] Nausea Only  . Bupropion Rash  . Sulfamethoxazole Rash   Objective:  There were no vitals filed for this visit. There is no height or weight on file to calculate BMI. Constitutional Well developed. Well nourished.  Vascular Dorsalis pedis pulses palpable bilaterally. Posterior tibial pulses palpable bilaterally. Capillary refill normal to all digits.  No cyanosis or clubbing noted. Pedal hair growth normal.  Neurologic Normal speech. Oriented to  person, place, and time. Epicritic sensation to light touch grossly present bilaterally.  Dermatologic Nails well groomed and normal in appearance. No open wounds. No skin lesions.  Orthopedic: Normal joint ROM without pain or crepitus bilaterally. Surgically rectus toes both feet No bony tenderness.   Radiographs: Taken and reviewed hardware intact no acute fractures dislocations Assessment:   1. Hammertoe of left foot   2. Hammertoe of right foot   3. Status post bilateral foot surgery    Plan:  Patient was evaluated and treated and all questions answered.  Status post surgical correction of hammertoes bilaterally -Discussed with patient that I do think that her pain is likely to subside with  time discussed continued icing and elevating when swollen.  Follow-up as needed should pain persist  Return if symptoms worsen or fail to improve.

## 2018-05-14 ENCOUNTER — Encounter: Payer: Self-pay | Admitting: Podiatry

## 2018-05-14 ENCOUNTER — Ambulatory Visit: Payer: Medicare Other | Admitting: Podiatry

## 2018-05-14 DIAGNOSIS — M79674 Pain in right toe(s): Secondary | ICD-10-CM | POA: Diagnosis not present

## 2018-05-14 DIAGNOSIS — M2041 Other hammer toe(s) (acquired), right foot: Secondary | ICD-10-CM | POA: Diagnosis not present

## 2018-05-14 DIAGNOSIS — M205X1 Other deformities of toe(s) (acquired), right foot: Secondary | ICD-10-CM

## 2018-05-14 NOTE — Progress Notes (Signed)
Subjective:  Patient ID: Debbie Schultz, female    DOB: 25-Jan-1951,  MRN: [id number]  Chief Complaint  Patient presents with  . Foot Pain    "here to talk about another possible surgery on right foot"    68 y.o. female presents with the above complaint.  Like to discuss surgical revision of her right third toe as she is still having pain in the joint when she is walking which is precluding her from doing a lot of activity.  Avid hiker would like to return to that without pain.  She is well pleased with her other toes.  Review of Systems: Negative except as noted in the HPI. Denies N/V/F/Ch.  Past Medical History:  Diagnosis Date  . Anxiety   . Arthritis    bilateral knees  . Back pain   . Depression   . Dysrhythmia    "high heart rate".  seen last Dec 2018  . Headache    resolved  . Hypothyroid   . Insomnia     Current Outpatient Medications:  .  Cholecalciferol (VITAMIN D3) 10000 units TABS, Take by mouth., Disp: , Rfl:  .  clotrimazole-betamethasone (LOTRISONE) cream, , Disp: , Rfl:  .  desvenlafaxine (PRISTIQ) 100 MG 24 hr tablet, Take 1 tablet (100 mg total) by mouth every morning., Disp: 30 tablet, Rfl: 2 .  Doxepin HCl (SILENOR) 3 MG TABS, Take 1 tablet (3 mg total) by mouth at bedtime as needed., Disp: 90 tablet, Rfl: 2 .  fluticasone (FLONASE) 50 MCG/ACT nasal spray, instill 1 spray into each nostril once daily, Disp: , Rfl: 0 .  HYDROcodone-acetaminophen (NORCO) 5-325 MG tablet, Take 1 tablet by mouth every 6 (six) hours as needed for moderate pain., Disp: 30 tablet, Rfl: 0 .  L-Methylfolate-Algae (DEPLIN 15 PO), Take 15 mg by mouth daily., Disp: , Rfl:  .  L-Methylfolate-Algae (DEPLIN 7.5 PO), Take by mouth., Disp: , Rfl:  .  liothyronine (CYTOMEL) 5 MCG tablet, TK 2 TS PO QAM, Disp: , Rfl:  .  LIOTHYRONINE SODIUM PO, Take 1 tablet by mouth daily. , Disp: , Rfl:  .  Multiple Vitamin (MULTIVITAMIN) capsule, Take 1 capsule by mouth daily., Disp: , Rfl:  .  Multiple  Vitamins-Minerals (ZINC PO), Take by mouth., Disp: , Rfl:  .  OMEGA-3 FATTY ACIDS PO, Take by mouth., Disp: , Rfl:  .  oxyCODONE-acetaminophen (PERCOCET) 10-325 MG tablet, Take 1 tablet by mouth every 4 (four) hours as needed for pain., Disp: 20 tablet, Rfl: 0 .  propranolol (INDERAL) 10 MG tablet, Take 10 mg by mouth 2 (two) times daily. , Disp: , Rfl:  .  temazepam (RESTORIL) 30 MG capsule, Take 1 capsule (30 mg total) by mouth at bedtime., Disp: 90 capsule, Rfl: 2 .  traZODone (DESYREL) 100 MG tablet, Take 100 mg by mouth at bedtime. , Disp: , Rfl: 0 .  XARELTO 15 MG TABS tablet, TK 1 T PO QD WF, Disp: , Rfl:  .  zinc gluconate 50 MG tablet, Take 50 mg by mouth daily., Disp: , Rfl:   Social History   Tobacco Use  Smoking Status Never Smoker  Smokeless Tobacco Never Used    Allergies  Allergen Reactions  . Symbicort [Budesonide-Formoterol Fumarate] Nausea Only  . Bupropion Rash  . Sulfamethoxazole Rash   Objective:  There were no vitals filed for this visit. There is no height or weight on file to calculate BMI. Constitutional Well developed. Well nourished.  Vascular Dorsalis pedis pulses palpable bilaterally.  Posterior tibial pulses palpable bilaterally. Capillary refill normal to all digits.  No cyanosis or clubbing noted. Pedal hair growth normal.  Neurologic Normal speech. Oriented to person, place, and time. Epicritic sensation to light touch grossly present bilaterally.  Dermatologic Nails well groomed and normal in appearance. No open wounds. No skin lesions.  Orthopedic: Normal joint ROM without pain or crepitus bilaterally. Slight PIP contracture right third toe with pain to palpation about the right third PIPJ   Radiographs: None today. Assessment:   1. Hammertoe of right foot   2. Contracture of toe of right foot   3. Pain in toe of right foot    Plan:  Patient was evaluated and treated and all questions answered.  Status post surgical correction of  hammertoes bilaterally, with nonunion of third hammertoe right foot -Discussed with patient revision of her right third toe hammertoe.  Would plan for resection of the joint with fixation with a screw.  Advised of risks and benefits and alternatives of surgery.  Discussed possible shortening of the digit due to repeat surgery.  Patient verbalized understanding and wishes to proceed. -Patient has failed all conservative therapy and wishes to proceed with surgical intervention. All risks, benefits, and alternatives discussed with patient. No guarantees given. Consent reviewed and signed by patient. -Planned procedures: Revision right third toe hammertoe with screw fixation  No follow-ups on file.

## 2018-05-18 ENCOUNTER — Telehealth: Payer: Self-pay | Admitting: *Deleted

## 2018-05-18 NOTE — Telephone Encounter (Signed)
"  I'm calling to schedule my surgery with Dr. Samuella Cota."  I just received your message.  I was going to give you a call.  Dr. Samuella Cota does not have anything available for this Wednesday nor the eighteenth.  He can do it on June 10, 2018.  "I can't do it then, how about the following week on April 8?"  That date is available.  Have you signed consent forms?  "Yes, I have."  You need to go online and register with the surgical center, instructions are in the brochure that we gave you.  "I did not receive a brochure."  Would you like me to mail you one?  "Yes, please mail it to me.  What time will my surgery be?"  I cannot give you an arrival time.  Someone from the surgical center will call you a day or two prior to your surgery date and they will give you your arrival time.  I mailed her the [facility] brochure.

## 2018-05-18 NOTE — Telephone Encounter (Signed)
"  I talked to Dr. Samuella Cota yesterday about having another surgery on the middle toe of my right foot.  I was calling you to get this scheduled.  I'm waiting to hear back from my primary care physician.  I'm taking Xarelto and Dr. Samuella Cota said I need to be off that three days prior to the surgery.  So I need to ask the pcp if that will be alright.  I don't know whether I'll hear back today or if I'll hear back on Monday.  I would like to schedule the surgery as soon as possible.  I don't know whether the timing is going to work for Wednesday, March 11, that would be the best day for me.  I will probably get the approval from Dr. Nehemiah Settle today or Monday or to schedule it for the following week March 18, which would be my second choice.  Give me a call.  I'll be available for the rest of the day."

## 2018-05-20 ENCOUNTER — Telehealth: Payer: Self-pay | Admitting: *Deleted

## 2018-05-20 NOTE — Telephone Encounter (Signed)
"  I had spoke to you and scheduled my surgery on April 8.  You had mentioned that he had time available on April 1 as well.  Is that time still available?"  Yes, it is available.  "Great, can you put me down for then?"  Yes, I will get you scheduled.  "If you have any cancellations in March can you let me know?"  He had a cancellation for March 25.  "Yes, that would be even better."  I will get you rescheduled to June 03, 2018.

## 2018-05-21 ENCOUNTER — Other Ambulatory Visit: Payer: Self-pay | Admitting: Psychiatry

## 2018-05-21 DIAGNOSIS — I82492 Acute embolism and thrombosis of other specified deep vein of left lower extremity: Secondary | ICD-10-CM | POA: Diagnosis not present

## 2018-05-21 DIAGNOSIS — M79671 Pain in right foot: Secondary | ICD-10-CM | POA: Diagnosis not present

## 2018-05-21 DIAGNOSIS — F3342 Major depressive disorder, recurrent, in full remission: Secondary | ICD-10-CM

## 2018-05-22 ENCOUNTER — Telehealth: Payer: Self-pay | Admitting: *Deleted

## 2018-05-22 NOTE — Telephone Encounter (Signed)
"  I am scheduled to have surgery.  My primary care physician has suggested I hold off on having surgery right now.  He said he doesn't recommend I be off the Xarelto right now.  He suggested I wait until April.  Plus, I don't want to have this surgery with all that's going on with this Corona Virus.  I'll call back later to schedule this once all this is over."  I will let Dr. Samuella Cota know.  I canceled the surgery via the surgical center's [facility] [employer].

## 2018-06-12 ENCOUNTER — Other Ambulatory Visit: Payer: Medicare Other

## 2018-06-24 DIAGNOSIS — I82402 Acute embolism and thrombosis of unspecified deep veins of left lower extremity: Secondary | ICD-10-CM | POA: Diagnosis not present

## 2018-07-14 ENCOUNTER — Other Ambulatory Visit (HOSPITAL_COMMUNITY): Payer: Self-pay | Admitting: Internal Medicine

## 2018-07-14 DIAGNOSIS — Z86718 Personal history of other venous thrombosis and embolism: Secondary | ICD-10-CM

## 2018-07-14 DIAGNOSIS — I824Z2 Acute embolism and thrombosis of unspecified deep veins of left distal lower extremity: Secondary | ICD-10-CM | POA: Diagnosis not present

## 2018-07-15 ENCOUNTER — Other Ambulatory Visit: Payer: Self-pay

## 2018-07-15 ENCOUNTER — Ambulatory Visit (HOSPITAL_COMMUNITY)
Admission: RE | Admit: 2018-07-15 | Discharge: 2018-07-15 | Disposition: A | Discharge status: Home / Self Care | Payer: Medicare Other | Source: Ambulatory Visit | Attending: Internal Medicine | Admitting: Internal Medicine

## 2018-07-15 DIAGNOSIS — I824Z2 Acute embolism and thrombosis of unspecified deep veins of left distal lower extremity: Secondary | ICD-10-CM | POA: Diagnosis not present

## 2018-07-15 DIAGNOSIS — Z86718 Personal history of other venous thrombosis and embolism: Secondary | ICD-10-CM

## 2018-07-15 NOTE — Progress Notes (Signed)
Left lower extremity venous duplex completed. Preliminary results in Chart review CV Proc. [employer], RVS 07/15/2018, 11:10 PM

## 2018-07-17 ENCOUNTER — Telehealth: Payer: Self-pay | Admitting: *Deleted

## 2018-07-17 NOTE — Telephone Encounter (Signed)
"  I was scheduled for toe surgery back in March and then everything shut down.  I was wondering if things are opening up for elective surgeries again.  Please give me a call.  It's no emergency, it's not a hurry.  I'm just wondering."

## 2018-07-22 ENCOUNTER — Other Ambulatory Visit: Payer: Self-pay

## 2018-07-22 ENCOUNTER — Ambulatory Visit (INDEPENDENT_AMBULATORY_CARE_PROVIDER_SITE_OTHER): Payer: Medicare Other | Admitting: Psychiatry

## 2018-07-22 DIAGNOSIS — F5101 Primary insomnia: Secondary | ICD-10-CM | POA: Diagnosis not present

## 2018-07-22 DIAGNOSIS — F331 Major depressive disorder, recurrent, moderate: Secondary | ICD-10-CM | POA: Diagnosis not present

## 2018-07-22 MED ORDER — DESVENLAFAXINE SUCCINATE ER 50 MG PO TB24: ORAL_TABLET | ORAL | 1 refills | Status: DC

## 2018-07-22 NOTE — Progress Notes (Signed)
Debbie Schultz [id number] 01/16/1951 68 y.o.  Virtual Visit via Telephone Note  I connected with pt on 07/22/18 at 10:00 AM EDT by telephone and verified that I am speaking with the correct person using two identifiers.   I discussed the limitations, risks, security and privacy concerns of performing an evaluation and management service by telephone and the availability of in person appointments. I also discussed with the patient that there may be a patient responsible charge related to this service. The patient expressed understanding and agreed to proceed.   I discussed the assessment and treatment plan with the patient. The patient was provided an opportunity to ask questions and all were answered. The patient agreed with the plan and demonstrated an understanding of the instructions.   The patient was advised to call back or seek an in-person evaluation if the symptoms worsen or if the condition fails to improve as anticipated.  I provided 30 minutes of non-face-to-face time during this encounter.  The patient was located at home.  The provider was located at home.   Corie Chiquito, PMHNP   Subjective:   Patient ID:  Debbie Schultz is a 68 y.o. (DOB 12/07/1950) female.  Chief Complaint:  Chief Complaint  Patient presents with  . Depression  . Anxiety    HPI Debbie Schultz presents for follow-up of depression, anxiety, and insomnia. She reports, "I'm not doing very well." She reports that she has received some bereavement counseling. She reports that she is having some difficulty with the pandemic and how it is being handled by the government. "I can barely stand to watch the news." She reports that she and her mother had very different political views and will think about how she and her mother would have disagreed "and being relieved that she is not here and then feeling guilty for feeling relieved." She reports that she and her sister have been quarantining for 9 weeks aside  from grocery curbside pickup and taking walks.  She reports that she has not been able to do things around the house that she would like to accomplish. "It doesn't feel like the usual depression I have experienced in the past for no reason. This time there is a very good reason." Reports that she has been having persistent sadness.  "You can't get away from the awfulness." Reports frequently thinking about loss of her mother, deaths related to COVID, and negative current events. Reports that she has been feeling tense at times and tightness in her chest- "it's fear." Has not noticed increased heart rate or SOB. She reports that she is having difficulty understanding where her fear originates since she is financially stable and has not had past experiences that create increased anxiety, and then feeling guilty that she has more resources than others. Energy and motivation have been low. She reports that her sleep has been adequate. Denies change in appetite. Reports that she started exercising a month ago doing online Zoom training sessions and going for a walk. Concentration is adequate. Denies anhedonia. Denies SI.    Review of Systems:  Review of Systems  HENT: Positive for sore throat.   Respiratory: Positive for cough.   Musculoskeletal: Negative for gait problem.  Skin: Positive for rash.       Reports that she had "red, itchy splotches" yesterday.  Allergic/Immunologic: Positive for environmental allergies.  Neurological: Negative for tremors.  Psychiatric/Behavioral:       Please refer to HPI    Medications: I have reviewed the patient's  current medications.  Current Outpatient Medications  Medication Sig Dispense Refill  . Cholecalciferol (VITAMIN D3) 10000 units TABS Take by mouth.    . clotrimazole-betamethasone (LOTRISONE) cream     . desvenlafaxine (PRISTIQ) 100 MG 24 hr tablet TAKE 1 TABLET(100 MG) BY MOUTH EVERY MORNING 90 tablet 1  . fluticasone (FLONASE) 50 MCG/ACT nasal  spray instill 1 spray into each nostril once daily  0  . L-Methylfolate-Algae (DEPLIN 15 PO) Take 15 mg by mouth daily.    Mar[place] Kitchen L-Methylfolate-Algae (DEPLIN 7.5 PO) Take by mouth.    . liothyronine (CYTOMEL) 5 MCG tablet TK 2 TS PO QAM    . LIOTHYRONINE SODIUM PO Take 1 tablet by mouth daily.     . Multiple Vitamin (MULTIVITAMIN) capsule Take 1 capsule by mouth daily.    . Multiple Vitamins-Minerals (ZINC PO) Take by mouth.    . OMEGA-3 FATTY ACIDS PO Take by mouth.    . oxyCODONE-acetaminophen (PERCOCET) 10-325 MG tablet Take 1 tablet by mouth every 4 (four) hours as needed for pain. 20 tablet 0  . propranolol (INDERAL) 10 MG tablet Take 10 mg by mouth 2 (two) times daily.     . traZODone (DESYREL) 100 MG tablet Take 100 mg by mouth at bedtime.   0  . XARELTO 15 MG TABS tablet TK 1 T PO QD WF    . zinc gluconate 50 MG tablet Take 50 mg by mouth daily.    Marland Kitchen desvenlafaxine (PRISTIQ) 50 MG 24 hr tablet Take 1 tablet with 100 mg tablet to equal total dose 150 mg daily. 30 tablet 1  . temazepam (RESTORIL) 30 MG capsule Take 1 capsule (30 mg total) by mouth at bedtime. 90 capsule 2   No current facility-administered medications for this visit.     Medication Side Effects: None  Allergies:  Allergies  Allergen Reactions  . Symbicort [Budesonide-Formoterol Fumarate] Nausea Only  . Bupropion Rash  . Sulfamethoxazole Rash    Past Medical History:  Diagnosis Date  . Anxiety   . Arthritis    bilateral knees  . Back pain   . Depression   . Dysrhythmia    "high heart rate".  seen last Dec 2018  . Headache    resolved  . Hypothyroid   . Insomnia     Family History  Problem Relation Age of Onset  . Heart disease Mother        PPM  . Heart attack Father   . Diabetes Maternal Aunt   . Stroke Paternal Grandmother   . Headache Neg Hx     Social History   Socioeconomic History  . Marital status: Unknown    Spouse name: Not on file  . Number of children: 0  . Years of education:  Masters  . Highest education level: Not on file  Occupational History  . Occupation: Retired    Comment: Company secretary Needs  . Financial resource strain: Not on file  . Food insecurity:    Worry: Not on file    Inability: Not on file  . Transportation needs:    Medical: Not on file    Non-medical: Not on file  Tobacco Use  . Smoking status: Never Smoker  . Smokeless tobacco: Never Used  Substance and Sexual Activity  . Alcohol use: Yes    Alcohol/week: 0.0 standard drinks    Comment: once a month  . Drug use: No  . Sexual activity: Not on file  Lifestyle  . Physical activity:  Days per week: Not on file    Minutes per session: Not on file  . Stress: Not on file  Relationships  . Social connections:    Talks on phone: Not on file    Gets together: Not on file    Attends religious service: Not on file    Active member of club or organization: Not on file    Attends meetings of clubs or organizations: Not on file    Relationship status: Not on file  . Intimate partner violence:    Fear of current or ex partner: Not on file    Emotionally abused: Not on file    Physically abused: Not on file    Forced sexual activity: Not on file  Other Topics Concern  . Not on file  Social History Narrative   Lives with mother.   Caffeine use: 1 cup coffee every morning   Tea rare    Past Medical History, Surgical history, Social history, and Family history were reviewed and updated as appropriate.   Please see review of systems for further details on the patient's review from today.   Objective:   Physical Exam:  There were no vitals taken for this visit.  Physical Exam Neurological:     Mental Status: She is alert and oriented to person, place, and time.     Cranial Nerves: No dysarthria.  Psychiatric:        Attention and Perception: Attention normal.        Mood and Affect: Mood is anxious and depressed.        Speech: Speech normal.        Behavior: Behavior is  cooperative.        Thought Content: Thought content normal. Thought content is not paranoid or delusional. Thought content does not include homicidal or suicidal ideation. Thought content does not include homicidal or suicidal plan.        Cognition and Memory: Cognition and memory normal.        Judgment: Judgment normal.     Lab Review:     Component Value Date/Time   NA 140 03/19/2018 2330   K 3.4 (L) 03/19/2018 2330   CL 110 03/19/2018 2330   CO2 21 (L) 03/19/2018 2330   GLUCOSE 171 (H) 03/19/2018 2330   BUN 13 03/19/2018 2330   CREATININE 0.99 03/19/2018 2330   CALCIUM 9.0 03/19/2018 2330   PROT 6.6 11/29/2015 1456   ALBUMIN 3.9 11/29/2015 1456   AST 17 11/29/2015 1456   ALT 34 11/29/2015 1456   ALKPHOS 70 11/29/2015 1456   BILITOT 0.3 11/29/2015 1456   GFRNONAA 59 (L) 03/19/2018 2330   GFRAA >60 03/19/2018 2330       Component Value Date/Time   WBC 7.4 03/19/2018 2330   RBC 4.58 03/19/2018 2330   HGB 14.2 03/19/2018 2330   HCT 43.3 03/19/2018 2330   PLT 283 03/19/2018 2330   MCV 94.5 03/19/2018 2330   MCH 31.0 03/19/2018 2330   MCHC 32.8 03/19/2018 2330   RDW 11.9 03/19/2018 2330   LYMPHSABS 2.2 03/19/2018 2330   MONOABS 0.6 03/19/2018 2330   EOSABS 0.2 03/19/2018 2330   BASOSABS 0.1 03/19/2018 2330    No results found for: POCLITH, LITHIUM   No results found for: PHENYTOIN, PHENOBARB, VALPROATE, CBMZ   .res Assessment: Plan:   Discussed treatment options with patient to include potential benefits, risk, and side effects of increasing Pristiq to 150 mg daily to improve depression and anxiety.  Patient agrees to increase in Pristiq. Also recommended considering therapy since patient continues to grieve the loss of her mother in addition to new psychosocial stressors.  Patient reports that she will contact bereavement counselor that she saw immediately after the death of her mother to possibly resume counseling with her.  Encourage patient to contact office if  she is unable to continue treatment with bereavement counselor and a referral could be made. 10 you all other medications as prescribed.  Patient to follow-up with this provider in 4 weeks or sooner if clinically indicated. Patient advised to contact office with any questions, adverse effects, or acute worsening in signs and symptoms.  Moderate episode of recurrent major depressive disorder (HCC) - Plan: desvenlafaxine (PRISTIQ) 50 MG 24 hr tablet  Primary insomnia  Please see After Visit Summary for patient specific instructions.  Future Appointments  Date Time Provider Department Center  08/20/2018 10:00 AM Corie Chiquitoarter, Crew Goren, PMHNP [facility] None    No orders of the defined types were placed in this encounter.     -------------------------------

## 2018-07-24 ENCOUNTER — Encounter: Payer: Self-pay | Admitting: Psychiatry

## 2018-07-29 NOTE — Telephone Encounter (Signed)
I am returning your call.  Our doctors are able to do elective surgeries now.  The surgical centers are open again.  "Okay, I would like to schedule an appointment to come in to see the doctor.  I have a toe I'd like for him to look at before I schedule may appointment."  I'll transfer you to a scheduler so you can make that appointment.  I transferred her to [place].

## 2018-07-31 ENCOUNTER — Ambulatory Visit: Payer: Medicare Other | Admitting: Podiatry

## 2018-07-31 ENCOUNTER — Other Ambulatory Visit: Payer: Self-pay

## 2018-07-31 ENCOUNTER — Encounter: Payer: Self-pay | Admitting: Podiatry

## 2018-07-31 ENCOUNTER — Ambulatory Visit (INDEPENDENT_AMBULATORY_CARE_PROVIDER_SITE_OTHER): Payer: Medicare Other

## 2018-07-31 VITALS — Temp 98.1°F

## 2018-07-31 DIAGNOSIS — I10 Essential (primary) hypertension: Secondary | ICD-10-CM

## 2018-07-31 DIAGNOSIS — M2041 Other hammer toe(s) (acquired), right foot: Secondary | ICD-10-CM

## 2018-07-31 DIAGNOSIS — M2042 Other hammer toe(s) (acquired), left foot: Secondary | ICD-10-CM | POA: Diagnosis not present

## 2018-07-31 DIAGNOSIS — E669 Obesity, unspecified: Secondary | ICD-10-CM

## 2018-07-31 NOTE — Patient Instructions (Signed)
Pre-Operative Instructions  Congratulations, you have decided to take an important step towards improving your quality of life.  You can be assured that the doctors and staff at [facility] will be with you every step of the way.  Here are some important things you should know:  1. Plan to be at the surgery center/hospital at least 1 (one) hour prior to your scheduled time, unless otherwise directed by the surgical center/hospital staff.  You must have a responsible adult accompany you, remain during the surgery and drive you home.  Make sure you have directions to the surgical center/hospital to ensure you arrive on time. 2. If you are having surgery at [facility] or [facility], you will need a copy of your medical history and physical form from your family physician within one month prior to the date of surgery. We will give you a form for your primary physician to complete.  3. We make every effort to accommodate the date you request for surgery.  However, there are times where surgery dates or times have to be moved.  We will contact you as soon as possible if a change in schedule is required.   4. No aspirin/ibuprofen for one week before surgery.  If you are on aspirin, any non-steroidal anti-inflammatory medications (Mobic, Aleve, Ibuprofen) should not be taken seven (7) days prior to your surgery.  You make take Tylenol for pain prior to surgery.  5. Medications - If you are taking daily heart and blood pressure medications, seizure, reflux, allergy, asthma, anxiety, pain or diabetes medications, make sure you notify the surgery center/hospital before the day of surgery so they can tell you which medications you should take or avoid the day of surgery. 6. No food or drink after midnight the night before surgery unless directed otherwise by surgical center/hospital staff. 7. No alcoholic beverages 24-hours prior to surgery.  No smoking 24-hours prior or 24-hours after  surgery. 8. Wear loose pants or shorts. They should be loose enough to fit over bandages, boots, and casts. 9. Don't wear slip-on shoes. Sneakers are preferred. 10. Bring your boot with you to the surgery center/hospital.  Also bring crutches or a walker if your physician has prescribed it for you.  If you do not have this equipment, it will be provided for you after surgery. 11. If you have not been contacted by the surgery center/hospital by the day before your surgery, call to confirm the date and time of your surgery. 12. Leave-time from work may vary depending on the type of surgery you have.  Appropriate arrangements should be made prior to surgery with your employer. 13. Prescriptions will be provided immediately following surgery by your doctor.  Fill these as soon as possible after surgery and take the medication as directed. Pain medications will not be refilled on weekends and must be approved by the doctor. 14. Remove nail polish on the operative foot and avoid getting pedicures prior to surgery. 15. Wash the night before surgery.  The night before surgery wash the foot and leg well with water and the antibacterial soap provided. Be sure to pay special attention to beneath the toenails and in between the toes.  Wash for at least three (3) minutes. Rinse thoroughly with water and dry well with a towel.  Perform this wash unless told not to do so by your physician.  Enclosed: 1 Ice pack (please put in freezer the night before surgery)   1 Hibiclens skin cleaner     Pre-op instructions  If you have any questions regarding the instructions, please do not hesitate to call our office.  [place]: [address], [place], [place] [address] -- [phone]  [place]: [address] [place], [place] [address] -- [phone]  [place]: [address]  [place], [place] [address] -- [phone]  [place]: [address], [address], [place], [place] [address] -- [phone]  Website:  [email or web address] 

## 2018-08-04 ENCOUNTER — Other Ambulatory Visit: Payer: Self-pay | Admitting: Psychiatry

## 2018-08-09 NOTE — Progress Notes (Signed)
Subjective:  Patient ID: Debbie Schultz, female    DOB: Nov 10, 1950,  MRN: [id number]  Chief Complaint  Patient presents with  . Toe Pain    3rd toe right - Previous surgery - DOS: 12-03-2017 Hammertoe Repair 2nd B/L and 3rd Rt   "I am still having trouble with this toe"    68 y.o. female presents with the above complaint.  States that the right third toe still bothering her also having pain to the left second toe with hammertoe there as well.  Would like to discuss surgical intervention.  Review of Systems: Negative except as noted in the HPI. Denies N/V/F/Ch.  Past Medical History:  Diagnosis Date  . Anxiety   . Arthritis    bilateral knees  . Back pain   . Depression   . Dysrhythmia    "high heart rate".  seen last Dec 2018  . Headache    resolved  . Hypothyroid   . Insomnia     Current Outpatient Medications:  .  aspirin EC 81 MG tablet, Take 81 mg by mouth daily., Disp: , Rfl:  .  Cholecalciferol (VITAMIN D3) 10000 units TABS, Take by mouth., Disp: , Rfl:  .  clotrimazole-betamethasone (LOTRISONE) cream, , Disp: , Rfl:  .  desvenlafaxine (PRISTIQ) 100 MG 24 hr tablet, TAKE 1 TABLET(100 MG) BY MOUTH EVERY MORNING, Disp: 90 tablet, Rfl: 1 .  desvenlafaxine (PRISTIQ) 50 MG 24 hr tablet, Take 1 tablet with 100 mg tablet to equal total dose 150 mg daily., Disp: 30 tablet, Rfl: 1 .  fluticasone (FLONASE) 50 MCG/ACT nasal spray, instill 1 spray into each nostril once daily, Disp: , Rfl: 0 .  L-Methylfolate-Algae (DEPLIN 15 PO), Take 15 mg by mouth daily., Disp: , Rfl:  .  L-Methylfolate-Algae (DEPLIN 15) 15-90.314 MG CAPS, TAKE 1 CAPSULE BY MOUTH EVERY DAY, Disp: 90 capsule, Rfl: 0 .  L-Methylfolate-Algae (DEPLIN 7.5 PO), Take by mouth., Disp: , Rfl:  .  liothyronine (CYTOMEL) 5 MCG tablet, TK 2 TS PO QAM, Disp: , Rfl:  .  LIOTHYRONINE SODIUM PO, Take 1 tablet by mouth daily. , Disp: , Rfl:  .  Multiple Vitamin (MULTIVITAMIN) capsule, Take 1 capsule by mouth daily., Disp: , Rfl:   .  Multiple Vitamins-Minerals (ZINC PO), Take by mouth., Disp: , Rfl:  .  OMEGA-3 FATTY ACIDS PO, Take by mouth., Disp: , Rfl:  .  oxyCODONE-acetaminophen (PERCOCET) 10-325 MG tablet, Take 1 tablet by mouth every 4 (four) hours as needed for pain., Disp: 20 tablet, Rfl: 0 .  propranolol (INDERAL) 10 MG tablet, Take 10 mg by mouth 2 (two) times daily. , Disp: , Rfl:  .  temazepam (RESTORIL) 30 MG capsule, Take 1 capsule (30 mg total) by mouth at bedtime., Disp: 90 capsule, Rfl: 2 .  traZODone (DESYREL) 100 MG tablet, Take 100 mg by mouth at bedtime. , Disp: , Rfl: 0 .  zinc gluconate 50 MG tablet, Take 50 mg by mouth daily., Disp: , Rfl:   Social History   Tobacco Use  Smoking Status Never Smoker  Smokeless Tobacco Never Used    Allergies  Allergen Reactions  . Symbicort [Budesonide-Formoterol Fumarate] Nausea Only  . Bupropion Rash  . Sulfamethoxazole Rash   Objective:   Vitals:   07/31/18 1348  Temp: 98.1 F (36.7 C)   There is no height or weight on file to calculate BMI. Constitutional Well developed. Well nourished.  Vascular Dorsalis pedis pulses palpable bilaterally. Posterior tibial pulses palpable bilaterally. Capillary refill  normal to all digits.  No cyanosis or clubbing noted. Pedal hair growth normal.  Neurologic Normal speech. Oriented to person, place, and time. Epicritic sensation to light touch grossly present bilaterally.  Dermatologic Nails well groomed and normal in appearance. No open wounds. No skin lesions.  Orthopedic: Normal joint ROM without pain or crepitus bilaterally. Slight PIP contracture right third toe with pain to palpation about the right third PIPJ hammertoe deformity to the left second toe with hammertoe formation   Radiographs: Taken and reviewed again nonunion of the right third toe and hammertoe noted to the left second toe Assessment:   1. Hammertoe of right foot   2. Hammertoe of left foot   3. Essential hypertension   4.  Obesity without serious comorbidity, unspecified classification, unspecified obesity type    Plan:  Patient was evaluated and treated and all questions answered.  Painful right third hammertoe, left second hammertoe -Discussed again with patient performing surgical correction of the painful hammertoe deformities.  Patient would like to proceed -Patient has failed all conservative therapy and wishes to proceed with surgical intervention. All risks, benefits, and alternatives discussed with patient. No guarantees given. Consent reviewed and signed by patient. -Planned procedures: Correction right third hammertoe left second hammertoe with likely screw fixation -Risk factors: obesity, HTN  30 minutes of face to face time were spent with the patient. >50% of this was spent on counseling and coordination of care. Specifically discussed with patient the above diagnoses and overall treatment plan.   Return for After Surgery.

## 2018-08-12 ENCOUNTER — Telehealth: Payer: Self-pay | Admitting: *Deleted

## 2018-08-12 NOTE — Telephone Encounter (Signed)
Would like to ask if another toe can be included in her procedure.

## 2018-08-12 NOTE — Telephone Encounter (Addendum)
"  Can you move Debbie Schultz to a different day?  There's too many scheduled for that day."  I will try.  I am calling to let you know the surgical center's scheduler called and stated they do not have any time available for 08/19/2018.  Is there any way possible that we can change the date to the following week, August 26, 2018?  "Yes, that will be fine."  I'll get it rescheduled to August 26, 2018.  I rescheduled her surgery via the surgical center's [employer] Portal to 08/26/2018.

## 2018-08-12 NOTE — Telephone Encounter (Signed)
Will have to have her come to discuss unfortunately.

## 2018-08-12 NOTE — Telephone Encounter (Signed)
"  I'm calling to schedule my surgery with Dr. Samuella Cota."  Do you have a date in mind?  He does surgeries on Wednesdays.  "I'd like to do it as soon as possible."  He can do it on June 10 or 17, 2020.  "Let's do it on June 10."  I'll get it scheduled.  Someone from the surgical center will give you a call a day or two prior to your surgery date and that person will give you your arrival time.  If you have not done so already, you need to register with the surgical center via their online portal.  The instructions are in the brochure that we gave you.

## 2018-08-14 ENCOUNTER — Telehealth: Payer: Self-pay | Admitting: *Deleted

## 2018-08-14 DIAGNOSIS — R609 Edema, unspecified: Secondary | ICD-10-CM | POA: Diagnosis not present

## 2018-08-14 DIAGNOSIS — M79605 Pain in left leg: Secondary | ICD-10-CM | POA: Diagnosis not present

## 2018-08-14 DIAGNOSIS — I82812 Embolism and thrombosis of superficial veins of left lower extremities: Secondary | ICD-10-CM | POA: Diagnosis not present

## 2018-08-14 NOTE — Telephone Encounter (Signed)
I am returning your call.  Dr. Samuella Cota said you would have to see him again for a consultation in order to add another procedure on to your surgery.  Would you like for me to transfer your call to a scheduler so you can schedule that appointment?  "Yes I would, please." I transferred her to [place].

## 2018-08-19 ENCOUNTER — Telehealth: Payer: Self-pay | Admitting: *Deleted

## 2018-08-19 NOTE — Telephone Encounter (Signed)
DOS 08/26/2018; CPT CODE: 44920 X 2 - HAMMER TOE REPAIR 3RD DIGIT RIGHT AND 2ND DIGIT LEFT FOOT   [employer]: Policy Effective : 12/15/1217 - 03/10/9998      In-Network    Max Per Benefit Period Year-to-Date Remaining  CoInsurance  $4200.00 $4021.63  Deductible     Out-Of-Pocket  $4200.00 4021.63   [facility]  In Network  [employer] Coinsurance  $250.00 per VISITS  $4200.00 per SERVICE YEAR   Benefit Limits: 7588.32 remaining for SERVICE YEAR

## 2018-08-20 ENCOUNTER — Ambulatory Visit: Payer: Medicare Other | Admitting: Podiatry

## 2018-08-20 ENCOUNTER — Ambulatory Visit: Payer: Medicare Other | Admitting: Psychiatry

## 2018-08-20 ENCOUNTER — Encounter: Payer: Self-pay | Admitting: Psychiatry

## 2018-08-20 ENCOUNTER — Other Ambulatory Visit: Payer: Self-pay

## 2018-08-20 VITALS — Temp 97.2°F

## 2018-08-20 DIAGNOSIS — M2041 Other hammer toe(s) (acquired), right foot: Secondary | ICD-10-CM

## 2018-08-20 DIAGNOSIS — M205X1 Other deformities of toe(s) (acquired), right foot: Secondary | ICD-10-CM

## 2018-08-20 DIAGNOSIS — F331 Major depressive disorder, recurrent, moderate: Secondary | ICD-10-CM | POA: Diagnosis not present

## 2018-08-20 DIAGNOSIS — F3342 Major depressive disorder, recurrent, in full remission: Secondary | ICD-10-CM | POA: Diagnosis not present

## 2018-08-20 DIAGNOSIS — F5101 Primary insomnia: Secondary | ICD-10-CM | POA: Diagnosis not present

## 2018-08-20 DIAGNOSIS — I8002 Phlebitis and thrombophlebitis of superficial vessels of left lower extremity: Secondary | ICD-10-CM | POA: Diagnosis not present

## 2018-08-20 DIAGNOSIS — I10 Essential (primary) hypertension: Secondary | ICD-10-CM | POA: Diagnosis not present

## 2018-08-20 DIAGNOSIS — M2042 Other hammer toe(s) (acquired), left foot: Secondary | ICD-10-CM

## 2018-08-20 DIAGNOSIS — E669 Obesity, unspecified: Secondary | ICD-10-CM

## 2018-08-20 MED ORDER — TEMAZEPAM 30 MG PO CAPS: 30.0000 mg | ORAL_CAPSULE | Freq: Every day | ORAL | 0 refills | Status: DC

## 2018-08-20 MED ORDER — DESVENLAFAXINE SUCCINATE ER 50 MG PO TB24: ORAL_TABLET | ORAL | 1 refills | Status: DC

## 2018-08-20 MED ORDER — DESVENLAFAXINE SUCCINATE ER 100 MG PO TB24: 100.0000 mg | ORAL_TABLET | Freq: Every day | ORAL | 1 refills | Status: DC

## 2018-08-20 NOTE — Progress Notes (Addendum)
Debbie Schultz [id number] [birth year] 68 y.o.  Virtual Visit via Telephone Note  I connected with pt on 08/20/18 at 10:00 AM EDT by telephone and verified that I am speaking with the correct person using two identifiers.   I discussed the limitations, risks, security and privacy concerns of performing an evaluation and management service by telephone and the availability of in person appointments. I also discussed with the patient that there may be a patient responsible charge related to this service. The patient expressed understanding and agreed to proceed.   I discussed the assessment and treatment plan with the patient. The patient was provided an opportunity to ask questions and all were answered. The patient agreed with the plan and demonstrated an understanding of the instructions.   The patient was advised to call back or seek an in-person evaluation if the symptoms worsen or if the condition fails to improve as anticipated.  I provided 30 minutes of non-face-to-face time during this encounter.  The patient was located at home.  The provider was located at [place].   Thayer Headings, PMHNP   Subjective:   Patient ID:  Debbie Schultz is a 68 y.o. (DOB Jun 27, 1950) female.  Chief Complaint:  Chief Complaint  Patient presents with  . Follow-up    Depression, anxiety, h/o insomnia    HPI Debbie Schultz presents for follow-up of depression and anxiety. She reports that her mood has improved with increased dose in Pristiq. "Overall I am doing much, much better." She reports that her depression has resolved and now is experiencing only episodic sadness in response to triggering events. She reports that she has had some situational stress and anxiety and reports that anxiety has been manageable and expected. She reports that grief is "conitnuing on its expected course." She reports that she continues to think of how her mother's views on current events would have  differed from her own. Reports that she initially had some difficulty with sleep initiation and this has resolved. Reports that her appetite is "too good." She reports that she and her sister typically eat healthy. She reports improved energy and motivation. She reports that she often chooses to read books instead of doing some of the items on her to do list. She reports adequate concentration. Reports that she has been going for less walks now due to increased heat and humidity. Denies SI.   Review of Systems:  Review of Systems  Musculoskeletal: Negative for gait problem.       Reports that she is going to have another toe surgery next week. She reports that her toes did not completely heel and are causing some pain.   Allergic/Immunologic: Positive for environmental allergies.  Neurological: Negative for tremors.  Hematological:       Reports that she had area on her leg that was swollen and painful and was told that she had possibly small clots in a vein.  Psychiatric/Behavioral:       Please refer to HPI    Medications: I have reviewed the patient's current medications.  Current Outpatient Medications  Medication Sig Dispense Refill  . aspirin EC 81 MG tablet Take 81 mg by mouth daily.    . Cholecalciferol (VITAMIN D3) 10000 units TABS Take by mouth.    . clotrimazole-betamethasone (LOTRISONE) cream     . desvenlafaxine (PRISTIQ) 100 MG 24 hr tablet Take 1 tablet (100 mg total) by mouth daily. Take with 50 mg tablet to equal total dose of 150 mg 90 tablet 1  .  desvenlafaxine (PRISTIQ) 50 MG 24 hr tablet Take 1 tablet with 100 mg tablet to equal total dose 150 mg daily. 30 tablet 1  . fluticasone (FLONASE) 50 MCG/ACT nasal spray instill 1 spray into each nostril once daily  0  . L-Methylfolate-Algae (DEPLIN 15 PO) Take 15 mg by mouth daily.    Marland Kitchen. L-Methylfolate-Algae (DEPLIN 15) 15-90.314 MG CAPS TAKE 1 CAPSULE BY MOUTH EVERY DAY 90 capsule 0  . L-Methylfolate-Algae (DEPLIN 7.5 PO) Take  by mouth.    . liothyronine (CYTOMEL) 5 MCG tablet TK 2 TS PO QAM    . Multiple Vitamin (MULTIVITAMIN) capsule Take 1 capsule by mouth daily.    . OMEGA-3 FATTY ACIDS PO Take by mouth.    . propranolol (INDERAL) 10 MG tablet Take 10 mg by mouth 2 (two) times daily.     . traZODone (DESYREL) 100 MG tablet Take 100 mg by mouth at bedtime.   0  . zinc gluconate 50 MG tablet Take 50 mg by mouth daily.    Marland Kitchen. LIOTHYRONINE SODIUM PO Take 1 tablet by mouth daily.     . Multiple Vitamins-Minerals (ZINC PO) Take by mouth.    . oxyCODONE-acetaminophen (PERCOCET) 10-325 MG tablet Take 1 tablet by mouth every 4 (four) hours as needed for pain. (Patient not taking: Reported on 08/20/2018) 20 tablet 0  . [START ON 09/05/2018] temazepam (RESTORIL) 30 MG capsule Take 1 capsule (30 mg total) by mouth at bedtime. 90 capsule 0   No current facility-administered medications for this visit.     Medication Side Effects: None  Allergies:  Allergies  Allergen Reactions  . Symbicort [Budesonide-Formoterol Fumarate] Nausea Only  . Bupropion Rash  . Sulfamethoxazole Rash    Past Medical History:  Diagnosis Date  . Anxiety   . Arthritis    bilateral knees  . Back pain   . Depression   . Dysrhythmia    "high heart rate".  seen last Dec 2018  . Headache    resolved  . Hypothyroid   . Insomnia     Family History  Problem Relation Age of Onset  . Heart disease Mother        PPM  . Heart attack Father   . Diabetes Maternal Aunt   . Stroke Paternal Grandmother   . Headache Neg Hx     Social History   Socioeconomic History  . Marital status: Unknown    Spouse name: Not on file  . Number of children: 0  . Years of education: Masters  . Highest education level: Not on file  Occupational History  . Occupation: Retired    Comment: Company secretaryteacher  Social Needs  . Financial resource strain: Not on file  . Food insecurity    Worry: Not on file    Inability: Not on file  . Transportation needs     Medical: Not on file    Non-medical: Not on file  Tobacco Use  . Smoking status: Never Smoker  . Smokeless tobacco: Never Used  Substance and Sexual Activity  . Alcohol use: Yes    Alcohol/week: 0.0 standard drinks    Comment: once a month  . Drug use: No  . Sexual activity: Not on file  Lifestyle  . Physical activity    Days per week: Not on file    Minutes per session: Not on file  . Stress: Not on file  Relationships  . Social connections    Talks on phone: Not on file  Gets together: Not on file    Attends religious service: Not on file    Active member of club or organization: Not on file    Attends meetings of clubs or organizations: Not on file    Relationship status: Not on file  . Intimate partner violence    Fear of current or ex partner: Not on file    Emotionally abused: Not on file    Physically abused: Not on file    Forced sexual activity: Not on file  Other Topics Concern  . Not on file  Social History Narrative   Lives with mother.   Caffeine use: 1 cup coffee every morning   Tea rare    Past Medical History, Surgical history, Social history, and Family history were reviewed and updated as appropriate.   Please see review of systems for further details on the patient's review from today.   Objective:   Physical Exam:  There were no vitals taken for this visit.  Physical Exam Constitutional:      General: She is not in acute distress.    Appearance: She is well-developed.  Musculoskeletal:        General: No deformity.  Neurological:     Mental Status: She is alert and oriented to person, place, and time.     Coordination: Coordination normal.  Psychiatric:        Attention and Perception: Attention and perception normal. She does not perceive auditory or visual hallucinations.        Mood and Affect: Mood normal. Mood is not anxious or depressed. Affect is not labile, blunt, angry or inappropriate.        Speech: Speech normal.         Behavior: Behavior normal.        Thought Content: Thought content normal. Thought content does not include homicidal or suicidal ideation. Thought content does not include homicidal or suicidal plan.        Cognition and Memory: Cognition and memory normal.        Judgment: Judgment normal.     Comments: Insight intact. No delusions.      Lab Review:     Component Value Date/Time   NA 140 03/19/2018 2330   K 3.4 (L) 03/19/2018 2330   CL 110 03/19/2018 2330   CO2 21 (L) 03/19/2018 2330   GLUCOSE 171 (H) 03/19/2018 2330   BUN 13 03/19/2018 2330   CREATININE 0.99 03/19/2018 2330   CALCIUM 9.0 03/19/2018 2330   PROT 6.6 11/29/2015 1456   ALBUMIN 3.9 11/29/2015 1456   AST 17 11/29/2015 1456   ALT 34 11/29/2015 1456   ALKPHOS 70 11/29/2015 1456   BILITOT 0.3 11/29/2015 1456   GFRNONAA 59 (L) 03/19/2018 2330   GFRAA >60 03/19/2018 2330       Component Value Date/Time   WBC 7.4 03/19/2018 2330   RBC 4.58 03/19/2018 2330   HGB 14.2 03/19/2018 2330   HCT 43.3 03/19/2018 2330   PLT 283 03/19/2018 2330   MCV 94.5 03/19/2018 2330   MCH 31.0 03/19/2018 2330   MCHC 32.8 03/19/2018 2330   RDW 11.9 03/19/2018 2330   LYMPHSABS 2.2 03/19/2018 2330   MONOABS 0.6 03/19/2018 2330   EOSABS 0.2 03/19/2018 2330   BASOSABS 0.1 03/19/2018 2330    No results found for: POCLITH, LITHIUM   No results found for: PHENYTOIN, PHENOBARB, VALPROATE, CBMZ   .res Assessment: Plan:   Will continue current plan of care since patient reports  that mood and anxiety have significantly improved with increase in Pristiq. Will continue Pristiq 150 mg daily.  Will send separate scripts for both Pristiq 100 mg and a 50 mg tablet to equal total dose of 150 mg daily for anxiety and depression. Continue temazepam 30 mg at bedtime for insomnia. Continue trazodone 100 mg at bedtime for insomnia.  Patient reports that she does not need a prescription at this time. Continue Deplin 15 mg daily for augmentation of  depression. Patient to follow-up in 6 months or sooner if clinically indicated. Patient advised to contact office with any questions, adverse effects, or acute worsening in signs and symptoms.   I agree with this treatment plan. Meredith Staggersarey Cottle, MD, DFAPA    Please see After Visit Summary for patient specific instructions.  Future Appointments  Date Time Provider Department Center  08/20/2018  2:45 PM Park Literrice, Michael J, DPM [facility]  02/19/2019  1:00 PM Corie Chiquitoarter, Adraine Biffle, PMHNP [facility] None    No orders of the defined types were placed in this encounter.     -------------------------------

## 2018-08-20 NOTE — Patient Instructions (Signed)
Pre-Operative Instructions  Congratulations, you have decided to take an important step towards improving your quality of life.  You can be assured that the doctors and staff at [facility] will be with you every step of the way.  Here are some important things you should know:  1. Plan to be at the surgery center/hospital at least 1 (one) hour prior to your scheduled time, unless otherwise directed by the surgical center/hospital staff.  You must have a responsible adult accompany you, remain during the surgery and drive you home.  Make sure you have directions to the surgical center/hospital to ensure you arrive on time. 2. If you are having surgery at [facility] or [facility], you will need a copy of your medical history and physical form from your family physician within one month prior to the date of surgery. We will give you a form for your primary physician to complete.  3. We make every effort to accommodate the date you request for surgery.  However, there are times where surgery dates or times have to be moved.  We will contact you as soon as possible if a change in schedule is required.   4. No aspirin/ibuprofen for one week before surgery.  If you are on aspirin, any non-steroidal anti-inflammatory medications (Mobic, Aleve, Ibuprofen) should not be taken seven (7) days prior to your surgery.  You make take Tylenol for pain prior to surgery.  5. Medications - If you are taking daily heart and blood pressure medications, seizure, reflux, allergy, asthma, anxiety, pain or diabetes medications, make sure you notify the surgery center/hospital before the day of surgery so they can tell you which medications you should take or avoid the day of surgery. 6. No food or drink after midnight the night before surgery unless directed otherwise by surgical center/hospital staff. 7. No alcoholic beverages 24-hours prior to surgery.  No smoking 24-hours prior or 24-hours after  surgery. 8. Wear loose pants or shorts. They should be loose enough to fit over bandages, boots, and casts. 9. Don't wear slip-on shoes. Sneakers are preferred. 10. Bring your boot with you to the surgery center/hospital.  Also bring crutches or a walker if your physician has prescribed it for you.  If you do not have this equipment, it will be provided for you after surgery. 11. If you have not been contacted by the surgery center/hospital by the day before your surgery, call to confirm the date and time of your surgery. 12. Leave-time from work may vary depending on the type of surgery you have.  Appropriate arrangements should be made prior to surgery with your employer. 13. Prescriptions will be provided immediately following surgery by your doctor.  Fill these as soon as possible after surgery and take the medication as directed. Pain medications will not be refilled on weekends and must be approved by the doctor. 14. Remove nail polish on the operative foot and avoid getting pedicures prior to surgery. 15. Wash the night before surgery.  The night before surgery wash the foot and leg well with water and the antibacterial soap provided. Be sure to pay special attention to beneath the toenails and in between the toes.  Wash for at least three (3) minutes. Rinse thoroughly with water and dry well with a towel.  Perform this wash unless told not to do so by your physician.  Enclosed: 1 Ice pack (please put in freezer the night before surgery)   1 Hibiclens skin cleaner     Pre-op instructions  If you have any questions regarding the instructions, please do not hesitate to call our office.  [place]: [address], , [place] [address] -- [phone]  [place]: [address] [place], [place] [address] -- [phone]  [place]: [address]  [place], [place] [address] -- [phone]  [place]: [address], [address], [place], [place] [address] -- [phone]  Website:  [email or web address] 

## 2018-08-24 ENCOUNTER — Telehealth: Payer: Self-pay | Admitting: *Deleted

## 2018-08-24 NOTE — Telephone Encounter (Signed)
Thanks

## 2018-08-24 NOTE — Telephone Encounter (Signed)
"  I am giving you a call in regards to your surgery.  Dr. March Rummage said we're rescheduling your surgery?  "Yes, he wanted me to see a vein specialist first, another postponement.  My appointment is scheduled for Wednesday.  I guess I'll call you back to reschedule it when I get my results."  Okay, sounds good.  I canceled her surgery that's scheduled for 08/26/2018 via the surgical center's [facility].

## 2018-08-25 ENCOUNTER — Telehealth (HOSPITAL_COMMUNITY): Payer: Self-pay | Admitting: Rehabilitation

## 2018-08-25 NOTE — Telephone Encounter (Signed)
The above patient or their representative was contacted and gave the following answers to these questions:         Do you have any of the following symptoms? No Fever                    Cough                   Shortness of breath  Do  you have any of the following other symptoms? No  muscle pain         vomiting,        diarrhea        rash         weakness        red eye        abdominal pain         bruising         bleeding              joint pain           severe headache  Have you been in contact with someone who was or has been sick in the past 2 weeks? No Yes                 Unsure                         Unable to assess   Does the person that you were in contact with have any of the following symptoms?  Cough         shortness of breath           muscle pain         vomiting,            diarrhea            rash            weakness           fever            red eye           abdominal pain          bruising  or  bleeding                joint pain                severe headache             Have you  or someone you have been in contact with traveled internationally in the last month?  No      If yes, which countries?  Have you  or someone you have been in contact with traveled outside [place] in the last month?  No      If yes, which state and city?  COMMENTS OR ACTION PLAN FOR THIS PATIENT:    

## 2018-08-26 ENCOUNTER — Ambulatory Visit: Payer: Medicare Other | Admitting: Family

## 2018-08-26 ENCOUNTER — Other Ambulatory Visit: Payer: Self-pay

## 2018-08-26 ENCOUNTER — Encounter: Payer: Self-pay | Admitting: Family

## 2018-08-26 ENCOUNTER — Telehealth: Payer: Self-pay | Admitting: *Deleted

## 2018-08-26 VITALS — BP 114/78 | Temp 97.5°F | Resp 14 | Ht 62.0 in | Wt 173.1 lb

## 2018-08-26 DIAGNOSIS — M7989 Other specified soft tissue disorders: Secondary | ICD-10-CM

## 2018-08-26 DIAGNOSIS — I82890 Acute embolism and thrombosis of other specified veins: Secondary | ICD-10-CM

## 2018-08-26 DIAGNOSIS — M79605 Pain in left leg: Secondary | ICD-10-CM | POA: Diagnosis not present

## 2018-08-26 NOTE — Progress Notes (Signed)
Referred by:  Seward Carol, MD [address] [place],  [place] [address]  Reason for referral: recurrent superficial venous thrombosis of left leg, mild thigh pain  History of Present Illness  Debbie Schultz is a 68 y.o. (01/22/1951) female who presents with chief complaint: recurrent superficial venous thrombosis of left leg. She was referred by Dr. Delfina Redwood. Pt here because she was positive for clots in her left leg. Pt said that she is having pain in her left leg. Pt said that she has been on Xarelto in the past for clots but when an ultrasound was done and the clot seemed to have resolved they stopped the blood thinner. She said that she started having pain again from the back of her knee up through her groin. She said that they found she has a cluster of clots in her vein. She said that she is suppose to be having foot surgery soon but had to be evaluated first.    Dr. Delfina Redwood started pt on Xarelto early this year to address right leg superficial venous thrombosis. She stopped the Xarelto in May or late April 2020 on Dr. Delfina Redwood advice.   She is wanting to have a second hammertoe surgery in both feet to address painful toes. Dr. Hardie Pulley is her podiatrist.   She has had mild swelling in her left lower leg since a left knee replacement in 2012, no worsening of swelling. She denies any swelling in her right leg. She had a right knee replacement in 2008.   She does not have DM, and has never used tobacco.  She was a Pharmacist, hospital in [place], retired to [place] in 2016, she now lives with her sister. She states that she has been more sedentary since she has been here. Part of this was being with her hospitalized mother who eventually died in 20-Apr-2018 of pneumonia.   She has a hx of spider veins.  She has never used compression hose.   Past Medical History:  Diagnosis Date  . Anxiety   . Arthritis    bilateral knees  . Back pain   . Depression   . Dysrhythmia    "high  heart rate".  seen last Dec 2018  . Headache    resolved  . Hypothyroid   . Insomnia     Past Surgical History:  Procedure Laterality Date  . BLADDER SURGERY    . BUNIONECTOMY    . CATARACT EXTRACTION, BILATERAL    . CYST EXCISION Left 03/18/2017   Procedure: EXCISION OF VOLAR CYST OF LEFT WRIST;  Surgeon: Daryll Brod, MD;  Location: [facility];  Service: Orthopedics;  Laterality: Left;  . KNEE ARTHROSCOPY    . REPLACEMENT TOTAL KNEE Right 2008  . REPLACEMENT TOTAL KNEE Left 2012  . TOE SURGERY    . TONSILLECTOMY      Social History   Socioeconomic History  . Marital status: Unknown    Spouse name: Not on file  . Number of children: 0  . Years of education: Masters  . Highest education level: Not on file  Occupational History  . Occupation: Retired    Comment: Research officer, political party Needs  . Financial resource strain: Not on file  . Food insecurity    Worry: Not on file    Inability: Not on file  . Transportation needs    Medical: Not on file    Non-medical: Not on file  Tobacco Use  . Smoking status: Never  Smoker  . Smokeless tobacco: Never Used  Substance and Sexual Activity  . Alcohol use: Yes    Alcohol/week: 0.0 standard drinks    Comment: once a month  . Drug use: No  . Sexual activity: Not on file  Lifestyle  . Physical activity    Days per week: Not on file    Minutes per session: Not on file  . Stress: Not on file  Relationships  . Social Musicianconnections    Talks on phone: Not on file    Gets together: Not on file    Attends religious service: Not on file    Active member of club or organization: Not on file    Attends meetings of clubs or organizations: Not on file    Relationship status: Not on file  . Intimate partner violence    Fear of current or ex partner: Not on file    Emotionally abused: Not on file    Physically abused: Not on file    Forced sexual activity: Not on file  Other Topics Concern  . Not on file  Social History  Narrative   Lives with mother.   Caffeine use: 1 cup coffee every morning   Tea rare    Family History  Problem Relation Age of Onset  . Heart disease Mother        PPM  . Heart attack Father   . Diabetes Maternal Aunt   . Stroke Paternal Grandmother   . Headache Neg Hx     Current Outpatient Medications on File Prior to Visit  Medication Sig Dispense Refill  . aspirin EC 81 MG tablet Take 81 mg by mouth daily.    . Cholecalciferol (VITAMIN D3) 10000 units TABS Take by mouth.    . clotrimazole-betamethasone (LOTRISONE) cream     . desvenlafaxine (PRISTIQ) 100 MG 24 hr tablet Take 1 tablet (100 mg total) by mouth daily. Take with 50 mg tablet to equal total dose of 150 mg 90 tablet 1  . desvenlafaxine (PRISTIQ) 50 MG 24 hr tablet Take 1 tablet with 100 mg tablet to equal total dose 150 mg daily. 30 tablet 1  . fluticasone (FLONASE) 50 MCG/ACT nasal spray instill 1 spray into each nostril once daily  0  . L-Methylfolate-Algae (DEPLIN 15 PO) Take 15 mg by mouth daily.    Marland Kitchen. L-Methylfolate-Algae (DEPLIN 15) 15-90.314 MG CAPS TAKE 1 CAPSULE BY MOUTH EVERY DAY 90 capsule 0  . L-Methylfolate-Algae (DEPLIN 7.5 PO) Take by mouth.    . liothyronine (CYTOMEL) 5 MCG tablet TK 2 TS PO QAM    . LIOTHYRONINE SODIUM PO Take 1 tablet by mouth daily.     . Multiple Vitamin (MULTIVITAMIN) capsule Take 1 capsule by mouth daily.    . Multiple Vitamins-Minerals (ZINC PO) Take by mouth.    . OMEGA-3 FATTY ACIDS PO Take by mouth.    . propranolol (INDERAL) 10 MG tablet Take 10 mg by mouth 2 (two) times daily.     Melene Muller. [START ON 09/05/2018] temazepam (RESTORIL) 30 MG capsule Take 1 capsule (30 mg total) by mouth at bedtime. 90 capsule 0  . traZODone (DESYREL) 100 MG tablet Take 100 mg by mouth at bedtime.   0  . zinc gluconate 50 MG tablet Take 50 mg by mouth daily.     No current facility-administered medications on file prior to visit.     Allergies  Allergen Reactions  . Symbicort  [Budesonide-Formoterol Fumarate] Nausea Only  . Bupropion Rash  .  Sulfamethoxazole Rash    REVIEW OF SYSTEMS: Cardiovascular: No chest pain, chest pressure, palpitations, orthopnea, or dyspnea on exertion. No claudication or rest pain,  No history of DVT or phlebitis before January 2020; possibly left leg in 2016, swelling resolved, had no medical evaluation. Pulmonary: No productive cough, asthma or wheezing. She states allergies to pollen in the Spring.  Neurologic: No weakness, paresthesias, aphasia, or amaurosis. No dizziness. Hematologic: No bleeding problems or clotting disorders. Musculoskeletal: No joint pain or joint swelling since her knee replacements. Has pain in both 2nd toes, and 3rd toe on the right.  Gastrointestinal: No blood in stool or hematemesis Genitourinary: No dysuria or hematuria. Psychiatric:: No history of major depression. She has been taking medication for depression since the 1990's, well controlled per pt Integumentary: No rashes or ulcers. Constitutional: No fever or chills.  Physical Examination Vitals:   08/26/18 1312  BP: 114/78  Resp: 14  Temp: (!) 97.5 F (36.4 C)  TempSrc: Temporal  Weight: 173 lb 1.6 oz (78.5 kg)  Height: 5\' 2"  (1.575 m)   Body mass index is 31.66 kg/m.  PHYSICAL EXAMINATION: General: Well dressed obese female in NAD  HEENT:  No gross abnormalities Pulmonary: Respirations are non-labored, CTAB with good air movement in all fields Abdomen: Soft and non-tender with normal bowel sounds.  Musculoskeletal: There are no major deformities.   Neurologic: No focal weakness or paresthesias are detected Skin: There are no ulcer or rashes noted. A few spider veins in both legs. No edema.  Psychiatric: The patient has normal affect. Cardiovascular: There is a regular rate and rhythm without significant murmur appreciated.   Vascular: Vessel Right Left  Radial 2+Palpable 2+Palpable  Carotid without bruit  without bruit  Aorta Not  palpable N/A  Popliteal Not palpable Not palpable  PT 1+Palpable 2+Palpable  DP 2+Palpable 2+Palpable   Non-Invasive Vascular Imaging  Bilateral LE Venous Duplex (07-15-18, requested by Dr. Nehemiah SettlePolite): Left Technical Findings: There was no evidence of a DVT in 03/2018. The SVT in the greater saphenous vein in the calf has resolved and there is minute chronic SGV thrombus noted in the mid thigh.  Summary: Right: There is no evidence of a common femoral vein obstruction.  Left: Findings consistent with chronic superficial vein thrombosis involving the left great saphenous vein. There is no evidence of deep vein thrombosis in the lower extremity. No cystic structure found in the popliteal fossa.     Medical Decision Making  Debbie FlakeKathryn Schultz is a 68 y.o. female who presents with recurrent chronic superficial venous thrombosis involving the left great saphenous vein. This initially was diagnosed in January 2020 by her PCP, symptoms of left leg pain and mild swelling resolved, then recurred recently. She was placed on Xarelto in January 2020, and this was stopped in amy or end of April 2020.    Based on the patient's history, examination, and venous duplex results, I recommend:  the use of 20-30 mm thigh high compression stockings during the day, pt was given a pair today.  Elevate her legs when not walking; I encourage at least 30 minutes walking daily in a safe environment.   I discussed with Dr. Darrick PennaFields the above; at this time she does not need a greater saphenous vein (which is a superficial vein) ablation, as this has mostly been accomplished by the thromboses.   To address the mild pain in her left leg, the thigh high stockings will help, elevation, and regular walking.  Superficial venous thromboses usually resolve  with time.     The patient will follow up in 3 months with Dr. Darrick PennaFields and bilateral LE venous duplex.   Thank you for allowing us to participate in this patient's care.   Charisse MarchSuzanne Nickel, RN, MSN, FNP-C [facility] Office: [phone]  08/26/2018, 1:45 PM  Clinic MD

## 2018-08-26 NOTE — Telephone Encounter (Signed)
"  My vein test came back clear, so I'd like to reschedule my surgery."  Do you have a date that you would like?  "I like to do it whenever his next available date is."  He can do it on September 16, 2018.  "July 8 will be just fine."  I'll get it schedule.  I rescheduled her surgery via the surgical center's [facility] for September 16, 2018.

## 2018-08-26 NOTE — Patient Instructions (Signed)
  To decrease swelling in your feet and legs: Elevate feet above slightly bent knees, feet above heart, overnight and 3-4 times per day for 20 minutes.   Wear thigh high compression hose during the day

## 2018-09-03 ENCOUNTER — Other Ambulatory Visit: Payer: Medicare Other

## 2018-09-08 ENCOUNTER — Telehealth: Payer: Self-pay | Admitting: *Deleted

## 2018-09-08 NOTE — Telephone Encounter (Signed)
DOS 09/09/2018; HAMMER TOE REPAIR 2ND B/L AND 3RD RT  BCBS: Effective Date - 03/11/2018 - 03/10/9998      In-Network    Max Per Benefit Period Year-to-Date Remaining  CoInsurance  $4200.00 $3931.63  Deductible     Out-Of-Pocket  $4200.00 3931.63   [facility]  In Network  [employer] Coinsurance  $250.00 per VISITS  $4200.00 per SERVICE YEAR   Benefit Limits: 3931.63 remaining for SERVICE YEAR

## 2018-09-08 NOTE — Progress Notes (Signed)
Subjective:  Patient ID: Debbie Schultz, female    DOB: 10/07/1950,  MRN: [id number]  Chief Complaint  Patient presents with  . Hammer Toe    Pt would like to discuss adding right 2nd hammertoe to scheduled surgery.    68 y.o. female presents with the above complaint.  History as above also would like to have her right second toe corrected because it does cause her pain.  Recently had a blood clot in her left leg not sure how it happened.  PCP states that it did not need treatment.  Review of Systems: Negative except as noted in the HPI. Denies N/V/F/Ch.  Past Medical History:  Diagnosis Date  . Anxiety   . Arthritis    bilateral knees  . Back pain   . Depression   . Dysrhythmia    "high heart rate".  seen last Dec 2018  . Headache    resolved  . Hypothyroid   . Insomnia     Current Outpatient Medications:  .  aspirin EC 81 MG tablet, Take 81 mg by mouth daily., Disp: , Rfl:  .  Cholecalciferol (VITAMIN D3) 10000 units TABS, Take by mouth., Disp: , Rfl:  .  clotrimazole-betamethasone (LOTRISONE) cream, , Disp: , Rfl:  .  desvenlafaxine (PRISTIQ) 100 MG 24 hr tablet, Take 1 tablet (100 mg total) by mouth daily. Take with 50 mg tablet to equal total dose of 150 mg, Disp: 90 tablet, Rfl: 1 .  desvenlafaxine (PRISTIQ) 50 MG 24 hr tablet, Take 1 tablet with 100 mg tablet to equal total dose 150 mg daily., Disp: 30 tablet, Rfl: 1 .  fluticasone (FLONASE) 50 MCG/ACT nasal spray, instill 1 spray into each nostril once daily, Disp: , Rfl: 0 .  L-Methylfolate-Algae (DEPLIN 15 PO), Take 15 mg by mouth daily., Disp: , Rfl:  .  L-Methylfolate-Algae (DEPLIN 15) 15-90.314 MG CAPS, TAKE 1 CAPSULE BY MOUTH EVERY DAY, Disp: 90 capsule, Rfl: 0 .  L-Methylfolate-Algae (DEPLIN 7.5 PO), Take by mouth., Disp: , Rfl:  .  liothyronine (CYTOMEL) 5 MCG tablet, TK 2 TS PO QAM, Disp: , Rfl:  .  LIOTHYRONINE SODIUM PO, Take 1 tablet by mouth daily. , Disp: , Rfl:  .  Multiple Vitamin (MULTIVITAMIN)  capsule, Take 1 capsule by mouth daily., Disp: , Rfl:  .  Multiple Vitamins-Minerals (ZINC PO), Take by mouth., Disp: , Rfl:  .  OMEGA-3 FATTY ACIDS PO, Take by mouth., Disp: , Rfl:  .  propranolol (INDERAL) 10 MG tablet, Take 10 mg by mouth 2 (two) times daily. , Disp: , Rfl:  .  temazepam (RESTORIL) 30 MG capsule, Take 1 capsule (30 mg total) by mouth at bedtime., Disp: 90 capsule, Rfl: 0 .  traZODone (DESYREL) 100 MG tablet, Take 100 mg by mouth at bedtime. , Disp: , Rfl: 0 .  zinc gluconate 50 MG tablet, Take 50 mg by mouth daily., Disp: , Rfl:   Social History   Tobacco Use  Smoking Status Never Smoker  Smokeless Tobacco Never Used    Allergies  Allergen Reactions  . Symbicort [Budesonide-Formoterol Fumarate] Nausea Only  . Bupropion Rash  . Sulfamethoxazole Rash   Objective:   Vitals:   08/20/18 1455  Temp: (!) 97.2 F (36.2 C)   There is no height or weight on file to calculate BMI. Constitutional Well developed. Well nourished.  Vascular Dorsalis pedis pulses palpable bilaterally. Posterior tibial pulses palpable bilaterally. Capillary refill normal to all digits.  No cyanosis or clubbing noted. Pedal  hair growth normal.  Neurologic Normal speech. Oriented to person, place, and time. Epicritic sensation to light touch grossly present bilaterally.  Dermatologic Nails well groomed and normal in appearance. No open wounds. No skin lesions.  Orthopedic: Normal joint ROM without pain or crepitus bilaterally. Slight PIP contracture right third toe with pain to palpation about the right third PIPJ hammertoe deformity to the left second toe with hammertoe formation.  Slight DIP contracture right second toe pain palpation   Radiographs: none Assessment:   1. Hammertoe of right foot   2. Hammertoe of left foot   3. Thrombophlebitis of superficial veins of left lower extremity   4. Essential hypertension   5. Obesity without serious comorbidity, unspecified  classification, unspecified obesity type   6. Contracture of toe of right foot    Plan:  Patient was evaluated and treated and all questions answered.  Painful right third hammertoe, left second hammertoe -Patient is having pain in right her second toe and would like to also have that toe corrected.  Consent forms redone having this told her procedures -Planned procedures: Correction right third hammertoe left second hammertoe with likely screw fixation, right 2nd hammertoe with likely screw fixation. -Discussed holding off until after she is cleared by vascular given recent blood clot.  No follow-ups on file.

## 2018-09-08 NOTE — Telephone Encounter (Signed)
I attempted to call the patient to see if she'd like to move her surgery up to tomorrow instead of next Wednesday.

## 2018-09-08 NOTE — Telephone Encounter (Signed)
"  I'm returning your call."  Thanks for calling me back.  Dr. March Rummage wanted me to call and see if you would like to move your surgery up to tomorrow.  "Yes, I would."  I'll get it rescheduled.  Someone from the surgical center should give you a call this afternoon.  They'll let you know what time to be there.  "Okay, thank you so much."  I changed the date via [facility] [employer] and I called and left a message for Caren Griffins, scheduler at the surgical center, informing her of the change.

## 2018-09-09 ENCOUNTER — Encounter: Payer: Self-pay | Admitting: Podiatry

## 2018-09-09 ENCOUNTER — Other Ambulatory Visit: Payer: Self-pay | Admitting: Podiatry

## 2018-09-09 DIAGNOSIS — K219 Gastro-esophageal reflux disease without esophagitis: Secondary | ICD-10-CM | POA: Diagnosis not present

## 2018-09-09 DIAGNOSIS — M2042 Other hammer toe(s) (acquired), left foot: Secondary | ICD-10-CM | POA: Diagnosis not present

## 2018-09-09 DIAGNOSIS — M2041 Other hammer toe(s) (acquired), right foot: Secondary | ICD-10-CM | POA: Diagnosis not present

## 2018-09-09 MED ORDER — OXYCODONE-ACETAMINOPHEN 10-325 MG PO TABS: 1.0000 | ORAL_TABLET | ORAL | 0 refills | Status: DC | PRN

## 2018-09-09 MED ORDER — CEPHALEXIN 500 MG PO CAPS: 500.0000 mg | ORAL_CAPSULE | Freq: Two times a day (BID) | ORAL | 0 refills | Status: DC

## 2018-09-09 NOTE — Progress Notes (Signed)
Rx sent to pharmacy for outpatient surgery. °

## 2018-09-10 ENCOUNTER — Telehealth: Payer: Self-pay

## 2018-09-10 ENCOUNTER — Telehealth: Payer: Self-pay | Admitting: Podiatry

## 2018-09-10 ENCOUNTER — Other Ambulatory Visit: Payer: Self-pay | Admitting: Podiatry

## 2018-09-10 MED ORDER — OXYCODONE-ACETAMINOPHEN 10-325 MG PO TABS: 1.0000 | ORAL_TABLET | ORAL | 0 refills | Status: DC | PRN

## 2018-09-10 NOTE — Telephone Encounter (Signed)
Patient Called asking for refill on Oxycodone. She stated she was only prescribed 20 in the bottle, is taking 6 a day and feels she will run out by Saturday.

## 2018-09-10 NOTE — Telephone Encounter (Signed)
I sent to the pharmacy. Debbie Schultz spoke to her to inform her

## 2018-09-10 NOTE — Telephone Encounter (Signed)
Dr. March Rummage please advice on Refill Pt. Already had a Rx for Oxy sent to her pharmacy yesterday.

## 2018-09-10 NOTE — Telephone Encounter (Signed)
Dr March Rummage please advise patients concern.  Taking too often?

## 2018-09-10 NOTE — Telephone Encounter (Signed)
Patient called requesting a refill of her Oxycodone stating she will run out over the weekend and wants to have another prescription sent it.  Pt had a prescription for Oxycodone sent to her pharmacy yesterday.

## 2018-09-10 NOTE — Progress Notes (Signed)
Refill for oxycodone sent to the pharmacy dated tomorrow.

## 2018-09-10 NOTE — Telephone Encounter (Signed)
POST OP CALL-    1) General condition stated by the patient: Doing good  2) Is the pt having pain? A little  3) Pain score:   4) Has the pt taken Rx'd pain medication, regularly or PRN? Yes, pt states it helps tremendously.  5) Is the pain medication giving relief? Yes  6) Any fever, chills, nausea, or vomiting, shortness of breath or tightness in calf? No  7) Is the bandage clean, dry and intact? Yes  8) Is there excessive tightness, bleeding or drainage coming through the bandage? No  9) Did you understand all of the post op instruction sheet given? Yes  10) Any questions or concerns regarding post op care/recovery? Pt wanted to discuss refill of pain medication. Pt was told refill would be dated for 7.3.2020 and that she may or may not be able to pick it up at the pharmacy that quickly. Pt was instructed that if she needed to discuss the pain medication she could call [facility] and would be directed to an on call physician.    Confirmed POV appointment with patient

## 2018-09-14 IMAGING — CT CT ELBOW*R* W/O CM
2 of 3 series · 17 of 30 positions shown, 20 images · non-contrast
Comparison: None.

CLINICAL DATA: Right elbow fracture.

EXAM:
CT OF THE LOWER RIGHT EXTREMITY WITHOUT CONTRAST
TECHNIQUE: Multidetector CT imaging of the right lower extremity was performed
according to the standard protocol.

[Series 4: ext-soft · axial · 0.34mm/px · z∈[+28,+230]mm · 12 of 121 slices shown, 15 images]
[im 10/121  soft-tissue]
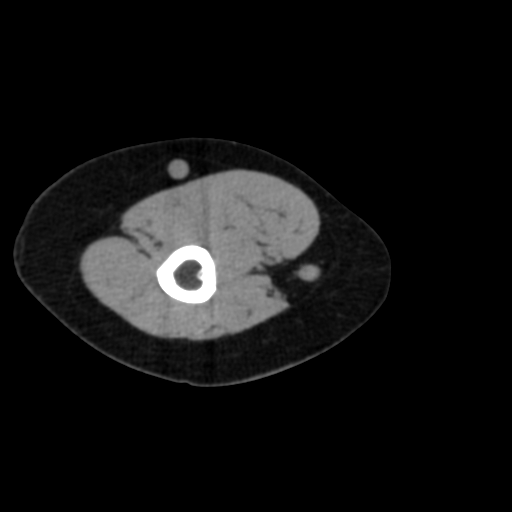
[im 10/121  bone]
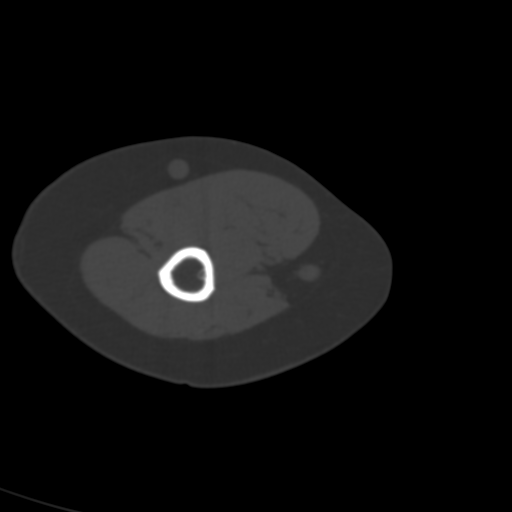
[im 19/121  bone]
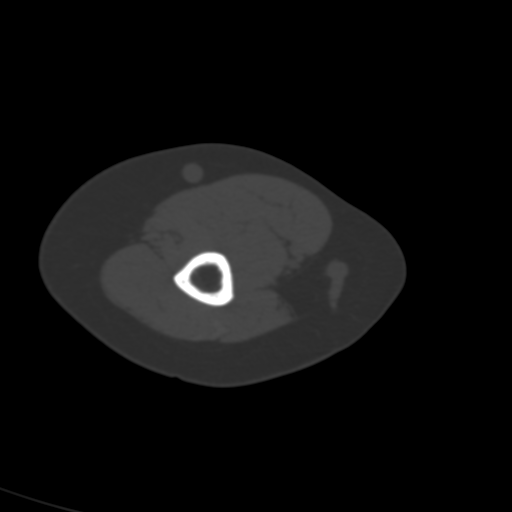
[im 28/121  bone]
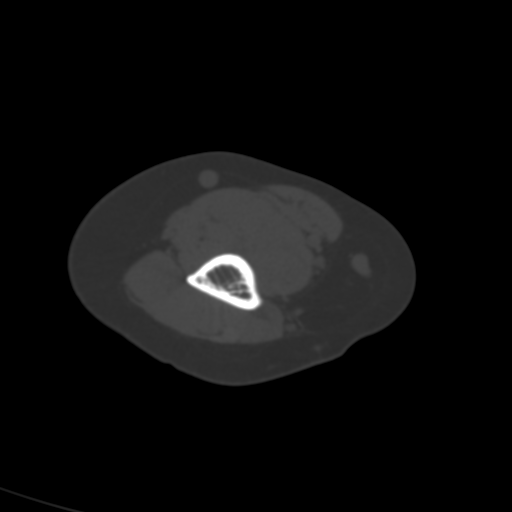
[im 37/121  bone]
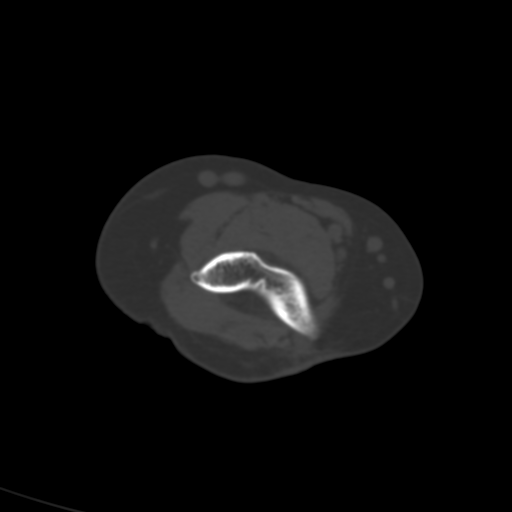
[im 47/121  soft-tissue]
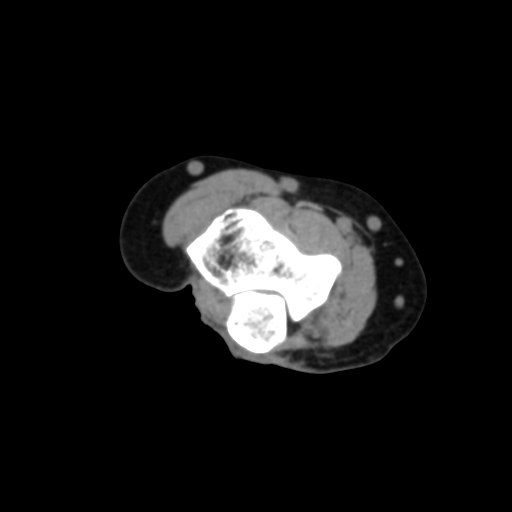
[im 47/121  bone]
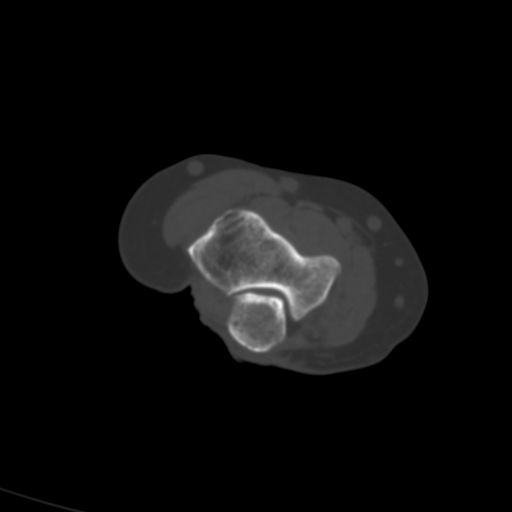
[im 56/121  bone]
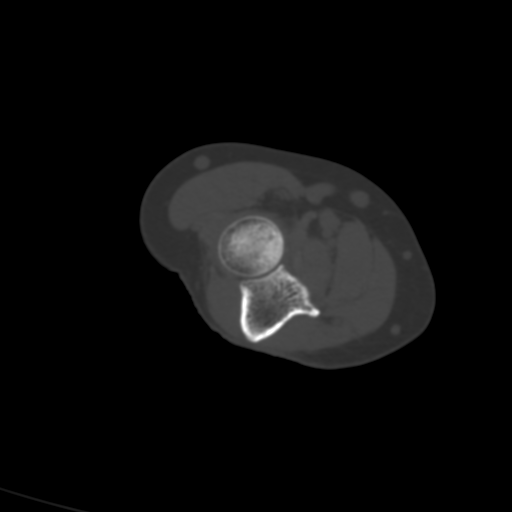
[im 65/121  bone]
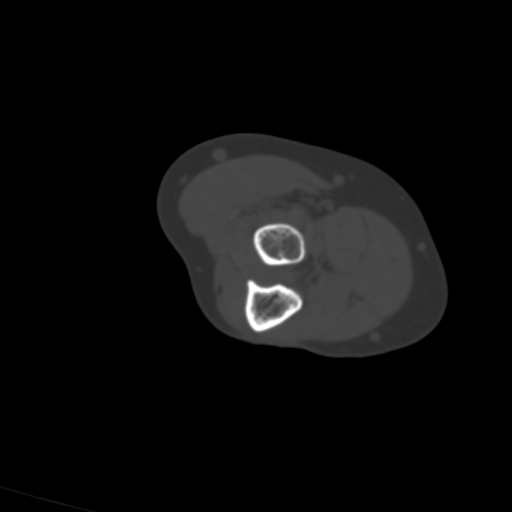
[im 74/121  bone]
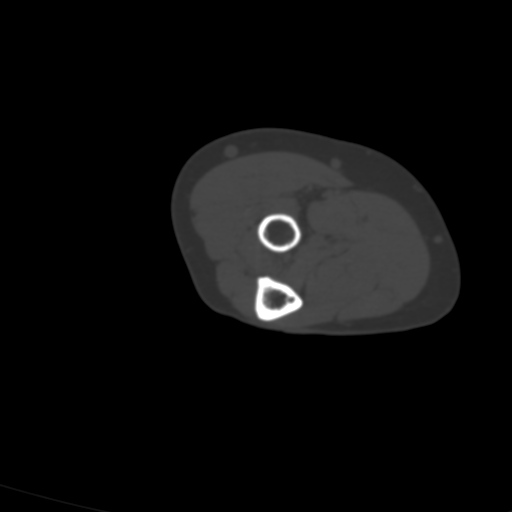
[im 84/121  soft-tissue]
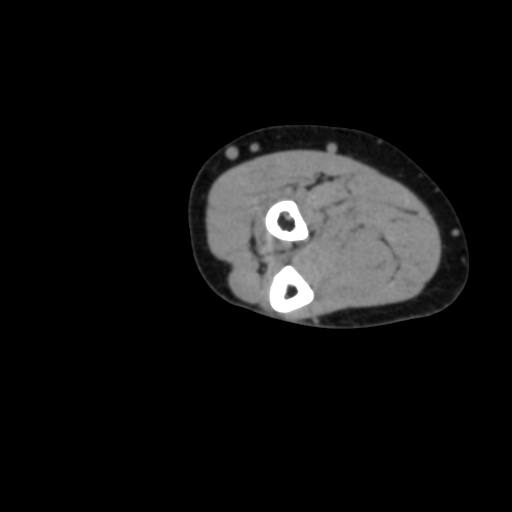
[im 84/121  bone]
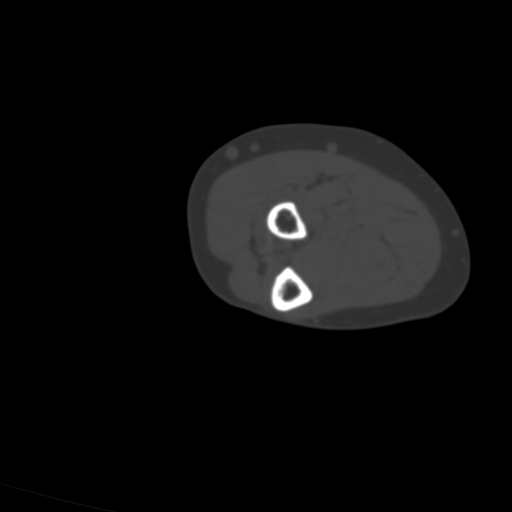
[im 93/121  bone]
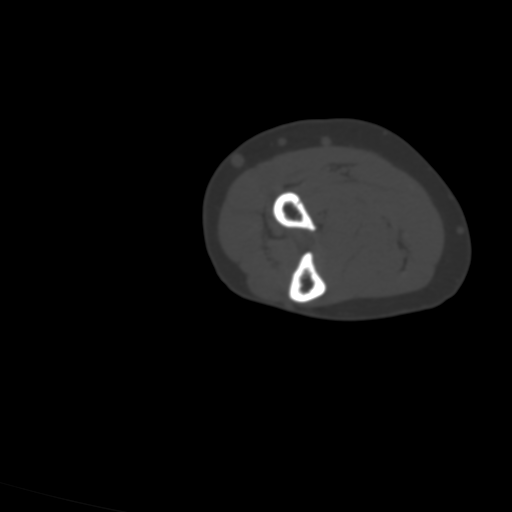
[im 102/121  bone]
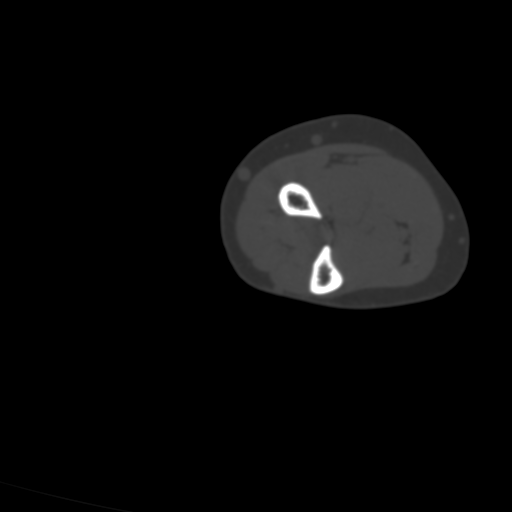
[im 111/121  bone]
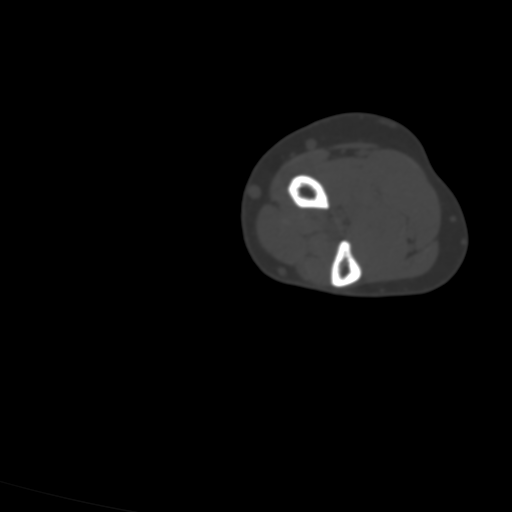

[Series 602: bone · sagittal · 0.47mm/px · 5 of 60 slices shown]
[im 10/60  soft-tissue]
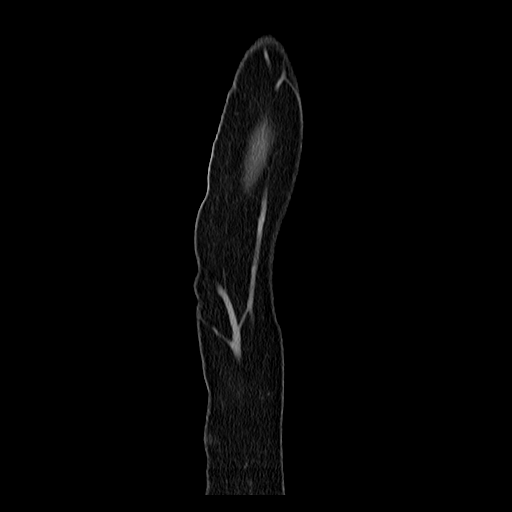
[im 12/60  bone]
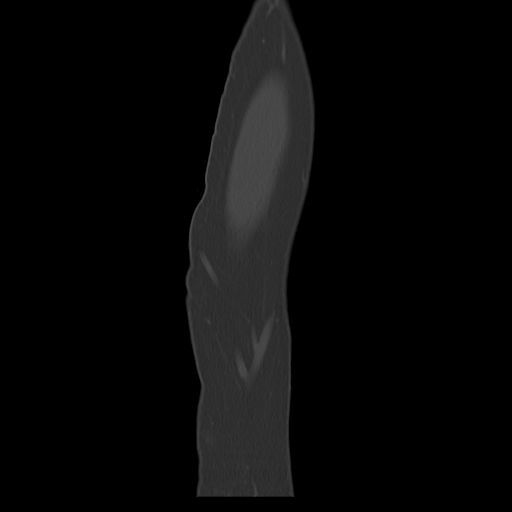
[im 24/60  bone]
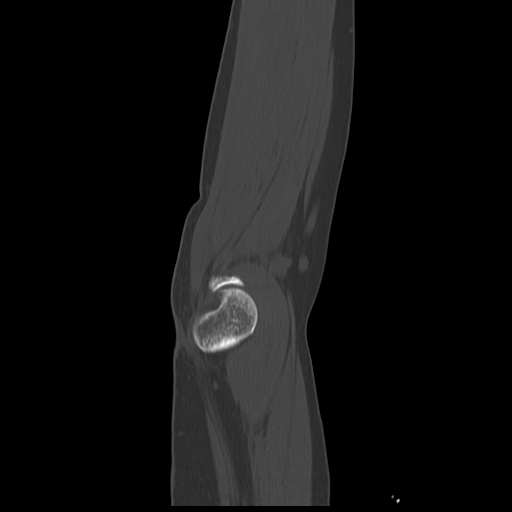
[im 36/60  bone]
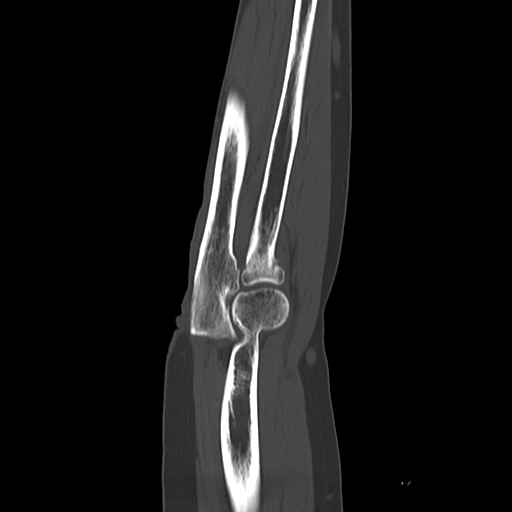
[im 48/60  bone]
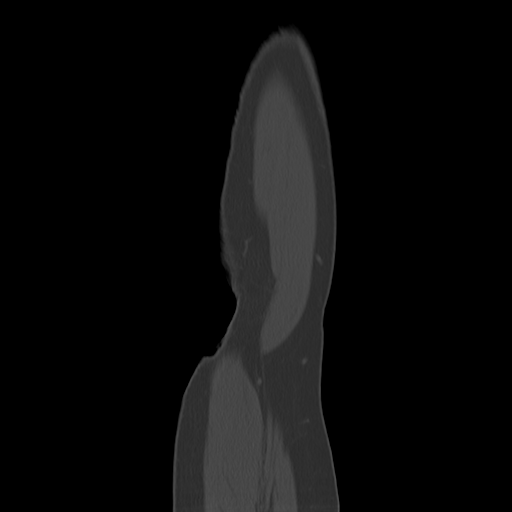

[17 of 30 positions shown; findings below may reference images not displayed]

FINDINGS: Bones/Joint/Cartilage

Nondisplaced, ununited right proximal radial neck fracture. No
significant callus formation.

No other fracture or dislocation. Joint spaces are maintained. No
aggressive osseous lesion. No joint effusion.

Ligaments

Suboptimally assessed by CT.

Muscles and Tendons

Muscles are normal. No muscle atrophy. Biceps tendon is intact.
Triceps tendon is intact.

Soft tissues

No fluid collection or hematoma.  No soft tissue mass.
IMPRESSION: 1. Nondisplaced, ununited right proximal radial neck fracture.

## 2018-09-14 NOTE — Telephone Encounter (Signed)
Can we call and see if she's doing ok on pain medication and still needs a refill or whether she got the one I ordered Thursday?

## 2018-09-14 NOTE — Telephone Encounter (Signed)
I called pt and asked if she had received the pain medication Dr. March Rummage had ordered last Thursday and how she felt. Pt states she feels "remarkably well".

## 2018-09-18 ENCOUNTER — Ambulatory Visit (INDEPENDENT_AMBULATORY_CARE_PROVIDER_SITE_OTHER): Payer: Medicare Other

## 2018-09-18 ENCOUNTER — Other Ambulatory Visit: Payer: Self-pay

## 2018-09-18 ENCOUNTER — Ambulatory Visit (INDEPENDENT_AMBULATORY_CARE_PROVIDER_SITE_OTHER): Payer: Medicare Other | Admitting: Podiatry

## 2018-09-18 VITALS — Temp 96.8°F

## 2018-09-18 DIAGNOSIS — M2041 Other hammer toe(s) (acquired), right foot: Secondary | ICD-10-CM

## 2018-09-18 DIAGNOSIS — Z09 Encounter for follow-up examination after completed treatment for conditions other than malignant neoplasm: Secondary | ICD-10-CM

## 2018-09-18 DIAGNOSIS — M2042 Other hammer toe(s) (acquired), left foot: Secondary | ICD-10-CM

## 2018-09-18 NOTE — Progress Notes (Signed)
Subjective:  Patient ID: Debbie Schultz, female    DOB: 1950/08/28,  MRN: [id number]  Chief Complaint  Patient presents with  . Routine Post Op     DOS 09/09/2018 HAMMER TOE REPAIR 2ND, 3RD RT AND 2ND LT " my pain has been remarkable, I have only needed pain meds prn"     DOS: 09/09/2018 Procedure: Repair 2nd/3rd Hammertoes Right, 2nd left  68 y.o. female returns for post-op check. Pain well controlled. Denies concerns.  Review of Systems: Negative except as noted in the HPI. Denies N/V/F/Ch.  Past Medical History:  Diagnosis Date  . Anxiety   . Arthritis    bilateral knees  . Back pain   . Depression   . Dysrhythmia    "high heart rate".  seen last Dec 2018  . Headache    resolved  . Hypothyroid   . Insomnia     Current Outpatient Medications:  .  aspirin EC 81 MG tablet, Take 81 mg by mouth daily., Disp: , Rfl:  .  cephALEXin (KEFLEX) 500 MG capsule, Take 1 capsule (500 mg total) by mouth 2 (two) times daily., Disp: 14 capsule, Rfl: 0 .  Cholecalciferol (VITAMIN D3) 10000 units TABS, Take by mouth., Disp: , Rfl:  .  clotrimazole-betamethasone (LOTRISONE) cream, , Disp: , Rfl:  .  desvenlafaxine (PRISTIQ) 100 MG 24 hr tablet, Take 1 tablet (100 mg total) by mouth daily. Take with 50 mg tablet to equal total dose of 150 mg, Disp: 90 tablet, Rfl: 1 .  desvenlafaxine (PRISTIQ) 50 MG 24 hr tablet, Take 1 tablet with 100 mg tablet to equal total dose 150 mg daily., Disp: 30 tablet, Rfl: 1 .  fluticasone (FLONASE) 50 MCG/ACT nasal spray, instill 1 spray into each nostril once daily, Disp: , Rfl: 0 .  L-Methylfolate-Algae (DEPLIN 15 PO), Take 15 mg by mouth daily., Disp: , Rfl:  .  L-Methylfolate-Algae (DEPLIN 15) 15-90.314 MG CAPS, TAKE 1 CAPSULE BY MOUTH EVERY DAY, Disp: 90 capsule, Rfl: 0 .  L-Methylfolate-Algae (DEPLIN 7.5 PO), Take by mouth., Disp: , Rfl:  .  liothyronine (CYTOMEL) 5 MCG tablet, TK 2 TS PO QAM, Disp: , Rfl:  .  LIOTHYRONINE SODIUM PO, Take 1 tablet by mouth  daily. , Disp: , Rfl:  .  Multiple Vitamin (MULTIVITAMIN) capsule, Take 1 capsule by mouth daily., Disp: , Rfl:  .  Multiple Vitamins-Minerals (ZINC PO), Take by mouth., Disp: , Rfl:  .  OMEGA-3 FATTY ACIDS PO, Take by mouth., Disp: , Rfl:  .  oxyCODONE-acetaminophen (PERCOCET) 10-325 MG tablet, Take 1 tablet by mouth every 4 (four) hours as needed for pain., Disp: 20 tablet, Rfl: 0 .  propranolol (INDERAL) 10 MG tablet, Take 10 mg by mouth 2 (two) times daily. , Disp: , Rfl:  .  temazepam (RESTORIL) 30 MG capsule, Take 1 capsule (30 mg total) by mouth at bedtime., Disp: 90 capsule, Rfl: 0 .  traZODone (DESYREL) 100 MG tablet, Take 100 mg by mouth at bedtime. , Disp: , Rfl: 0 .  zinc gluconate 50 MG tablet, Take 50 mg by mouth daily., Disp: , Rfl:   Social History   Tobacco Use  Smoking Status Never Smoker  Smokeless Tobacco Never Used    Allergies  Allergen Reactions  . Symbicort [Budesonide-Formoterol Fumarate] Nausea Only  . Bupropion Rash  . Sulfamethoxazole Rash   Objective:   Vitals:   09/18/18 1020  Temp: (!) 96.8 F (36 C)   There is no height or weight on  file to calculate BMI. Constitutional Well developed. Well nourished.  Vascular Foot warm and well perfused. Capillary refill normal to all digits.   Neurologic Normal speech. Oriented to person, place, and time. Epicritic sensation to light touch grossly present bilaterally.  Dermatologic Skin healing well without signs of infection. Skin edges well coapted without signs of infection.  Orthopedic: Tenderness to palpation noted about the surgical site.   Radiographs: Taken and reviewed. Good alignment noted. Fixation intact. Assessment:   1. Hammertoe of right foot   2. Hammertoe of left foot   3. Surgery follow-up    Plan:  Patient was evaluated and treated and all questions answered.  S/p foot surgery right -Progressing as expected post-operatively. -XR: As above -WB Status: WBAT in surgical shoes  -Sutures: intact. -Medications: none refilled. -Foot redressed.  Return in about 1 week (around 09/25/2018) for keep current post op appt, Andreu Drudge patient.

## 2018-09-24 ENCOUNTER — Other Ambulatory Visit: Payer: Self-pay

## 2018-09-24 ENCOUNTER — Ambulatory Visit (INDEPENDENT_AMBULATORY_CARE_PROVIDER_SITE_OTHER): Payer: Medicare Other | Admitting: Podiatry

## 2018-09-24 VITALS — Temp 98.6°F

## 2018-09-24 DIAGNOSIS — M2041 Other hammer toe(s) (acquired), right foot: Secondary | ICD-10-CM

## 2018-09-24 DIAGNOSIS — Z9889 Other specified postprocedural states: Secondary | ICD-10-CM

## 2018-09-24 DIAGNOSIS — M2042 Other hammer toe(s) (acquired), left foot: Secondary | ICD-10-CM

## 2018-10-09 ENCOUNTER — Ambulatory Visit (INDEPENDENT_AMBULATORY_CARE_PROVIDER_SITE_OTHER): Payer: Medicare Other | Admitting: Podiatry

## 2018-10-09 ENCOUNTER — Ambulatory Visit (INDEPENDENT_AMBULATORY_CARE_PROVIDER_SITE_OTHER): Payer: Medicare Other

## 2018-10-09 ENCOUNTER — Other Ambulatory Visit: Payer: Self-pay

## 2018-10-09 DIAGNOSIS — M2041 Other hammer toe(s) (acquired), right foot: Secondary | ICD-10-CM | POA: Diagnosis not present

## 2018-10-09 DIAGNOSIS — M2042 Other hammer toe(s) (acquired), left foot: Secondary | ICD-10-CM | POA: Diagnosis not present

## 2018-10-09 MED ORDER — OXYCODONE-ACETAMINOPHEN 10-325 MG PO TABS: 1.0000 | ORAL_TABLET | ORAL | 0 refills | Status: DC | PRN

## 2018-10-09 NOTE — Progress Notes (Signed)
Subjective:  Patient ID: Debbie Schultz, female    DOB: [birth year],  MRN: [id number]  Chief Complaint  Patient presents with  . Routine Post Op    POV#3 DOS 09/09/2018 HAMMER TOE REPAIR 2ND, 3RD RT AND 2ND LT     DOS: 09/09/2018 Procedure: Repair 2nd/3rd Hammertoes Right, 2nd left  68 y.o. female returns for post-op check. Doing very well denies issues or concerns.  Review of Systems: Negative except as noted in the HPI. Denies N/V/F/Ch.  Past Medical History:  Diagnosis Date  . Anxiety   . Arthritis    bilateral knees  . Back pain   . Depression   . Dysrhythmia    "high heart rate".  seen last Dec 2018  . Headache    resolved  . Hypothyroid   . Insomnia     Current Outpatient Medications:  .  aspirin EC 81 MG tablet, Take 81 mg by mouth daily., Disp: , Rfl:  .  cephALEXin (KEFLEX) 500 MG capsule, Take 1 capsule (500 mg total) by mouth 2 (two) times daily., Disp: 14 capsule, Rfl: 0 .  Cholecalciferol (VITAMIN D3) 10000 units TABS, Take by mouth., Disp: , Rfl:  .  clotrimazole-betamethasone (LOTRISONE) cream, , Disp: , Rfl:  .  desvenlafaxine (PRISTIQ) 100 MG 24 hr tablet, Take 1 tablet (100 mg total) by mouth daily. Take with 50 mg tablet to equal total dose of 150 mg, Disp: 90 tablet, Rfl: 1 .  desvenlafaxine (PRISTIQ) 50 MG 24 hr tablet, Take 1 tablet with 100 mg tablet to equal total dose 150 mg daily., Disp: 30 tablet, Rfl: 1 .  fluticasone (FLONASE) 50 MCG/ACT nasal spray, instill 1 spray into each nostril once daily, Disp: , Rfl: 0 .  L-Methylfolate-Algae (DEPLIN 15 PO), Take 15 mg by mouth daily., Disp: , Rfl:  .  L-Methylfolate-Algae (DEPLIN 15) 15-90.314 MG CAPS, TAKE 1 CAPSULE BY MOUTH EVERY DAY, Disp: 90 capsule, Rfl: 0 .  L-Methylfolate-Algae (DEPLIN 7.5 PO), Take by mouth., Disp: , Rfl:  .  liothyronine (CYTOMEL) 5 MCG tablet, TK 2 TS PO QAM, Disp: , Rfl:  .  LIOTHYRONINE SODIUM PO, Take 1 tablet by mouth daily. , Disp: , Rfl:  .  Multiple Vitamin  (MULTIVITAMIN) capsule, Take 1 capsule by mouth daily., Disp: , Rfl:  .  Multiple Vitamins-Minerals (ZINC PO), Take by mouth., Disp: , Rfl:  .  OMEGA-3 FATTY ACIDS PO, Take by mouth., Disp: , Rfl:  .  oxyCODONE-acetaminophen (PERCOCET) 10-325 MG tablet, Take 1 tablet by mouth every 4 (four) hours as needed for pain., Disp: 20 tablet, Rfl: 0 .  propranolol (INDERAL) 10 MG tablet, Take 10 mg by mouth 2 (two) times daily. , Disp: , Rfl:  .  temazepam (RESTORIL) 30 MG capsule, Take 1 capsule (30 mg total) by mouth at bedtime., Disp: 90 capsule, Rfl: 0 .  traZODone (DESYREL) 100 MG tablet, Take 100 mg by mouth at bedtime. , Disp: , Rfl: 0 .  zinc gluconate 50 MG tablet, Take 50 mg by mouth daily., Disp: , Rfl:   Social History   Tobacco Use  Smoking Status Never Smoker  Smokeless Tobacco Never Used    Allergies  Allergen Reactions  . Symbicort [Budesonide-Formoterol Fumarate] Nausea Only  . Bupropion Rash  . Sulfamethoxazole Rash   Objective:   There were no vitals filed for this visit. There is no height or weight on file to calculate BMI. Constitutional Well developed. Well nourished.  Vascular Foot warm and well perfused. Capillary  refill normal to all digits.   Neurologic Normal speech. Oriented to person, place, and time. Epicritic sensation to light touch grossly present bilaterally.  Dermatologic Skin well healed.  Orthopedic: Tenderness to palpation noted about the surgical site.   Radiographs: Taken and reviewed. Good alignment of digits osseous bridging appreciated. Assessment:   1. Hammertoe of right foot   2. Hammertoe of left foot    Plan:  Patient was evaluated and treated and all questions answered.  S/p foot surgery right -Progressing as expected post-operatively. -XR: As above -WB Status: WBAT in surgical shoes -Sutures: out. -Medications: pain Rx refilled. -Single layer coban applied per patient request for swelling. -F/u in 2 weeks for repeat XRs and  progression of WB status.  Return in about 2 weeks (around 10/23/2018) for XRs, Bilateral.

## 2018-10-12 NOTE — Progress Notes (Signed)
Subjective:  Patient ID: Debbie Schultz, female    DOB: Feb 25, 1951,  MRN: [id number]  Chief Complaint  Patient presents with  . Routine Post Op     DOS 09/09/2018 HAMMER TOE REPAIR 2ND, 3RD RT AND 2ND LT " my toes feel good"     DOS: 09/09/2018 Procedure: Repair 2nd/3rd Hammertoes Right, 2nd left  68 y.o. female returns for post-op check. Pain well controlled. Denies concerns.  States her toes feel good.  Review of Systems: Negative except as noted in the HPI. Denies N/V/F/Ch.  Past Medical History:  Diagnosis Date  . Anxiety   . Arthritis    bilateral knees  . Back pain   . Depression   . Dysrhythmia    "high heart rate".  seen last Dec 2018  . Headache    resolved  . Hypothyroid   . Insomnia     Current Outpatient Medications:  .  aspirin EC 81 MG tablet, Take 81 mg by mouth daily., Disp: , Rfl:  .  cephALEXin (KEFLEX) 500 MG capsule, Take 1 capsule (500 mg total) by mouth 2 (two) times daily., Disp: 14 capsule, Rfl: 0 .  Cholecalciferol (VITAMIN D3) 10000 units TABS, Take by mouth., Disp: , Rfl:  .  clotrimazole-betamethasone (LOTRISONE) cream, , Disp: , Rfl:  .  desvenlafaxine (PRISTIQ) 100 MG 24 hr tablet, Take 1 tablet (100 mg total) by mouth daily. Take with 50 mg tablet to equal total dose of 150 mg, Disp: 90 tablet, Rfl: 1 .  desvenlafaxine (PRISTIQ) 50 MG 24 hr tablet, Take 1 tablet with 100 mg tablet to equal total dose 150 mg daily., Disp: 30 tablet, Rfl: 1 .  fluticasone (FLONASE) 50 MCG/ACT nasal spray, instill 1 spray into each nostril once daily, Disp: , Rfl: 0 .  L-Methylfolate-Algae (DEPLIN 15 PO), Take 15 mg by mouth daily., Disp: , Rfl:  .  L-Methylfolate-Algae (DEPLIN 15) 15-90.314 MG CAPS, TAKE 1 CAPSULE BY MOUTH EVERY DAY, Disp: 90 capsule, Rfl: 0 .  L-Methylfolate-Algae (DEPLIN 7.5 PO), Take by mouth., Disp: , Rfl:  .  liothyronine (CYTOMEL) 5 MCG tablet, TK 2 TS PO QAM, Disp: , Rfl:  .  LIOTHYRONINE SODIUM PO, Take 1 tablet by mouth daily. , Disp: ,  Rfl:  .  Multiple Vitamin (MULTIVITAMIN) capsule, Take 1 capsule by mouth daily., Disp: , Rfl:  .  Multiple Vitamins-Minerals (ZINC PO), Take by mouth., Disp: , Rfl:  .  OMEGA-3 FATTY ACIDS PO, Take by mouth., Disp: , Rfl:  .  oxyCODONE-acetaminophen (PERCOCET) 10-325 MG tablet, Take 1 tablet by mouth every 4 (four) hours as needed for pain., Disp: 20 tablet, Rfl: 0 .  propranolol (INDERAL) 10 MG tablet, Take 10 mg by mouth 2 (two) times daily. , Disp: , Rfl:  .  temazepam (RESTORIL) 30 MG capsule, Take 1 capsule (30 mg total) by mouth at bedtime., Disp: 90 capsule, Rfl: 0 .  traZODone (DESYREL) 100 MG tablet, Take 100 mg by mouth at bedtime. , Disp: , Rfl: 0 .  zinc gluconate 50 MG tablet, Take 50 mg by mouth daily., Disp: , Rfl:   Social History   Tobacco Use  Smoking Status Never Smoker  Smokeless Tobacco Never Used    Allergies  Allergen Reactions  . Symbicort [Budesonide-Formoterol Fumarate] Nausea Only  . Bupropion Rash  . Sulfamethoxazole Rash   Objective:   Vitals:   09/24/18 1142  Temp: 98.6 F (37 C)   There is no height or weight on file to calculate  BMI. Constitutional Well developed. Well nourished.  Vascular Foot warm and well perfused. Capillary refill normal to all digits.   Neurologic Normal speech. Oriented to person, place, and time. Epicritic sensation to light touch grossly present bilaterally.  Dermatologic Skin healing well without signs of infection. Skin edges well coapted without signs of infection.  Orthopedic:  Mild tenderness to palpation noted about the surgical site.   Radiographs: None today Assessment:   1. Hammertoe of right foot   2. Post-operative state   3. Hammertoe of left foot    Plan:  Patient was evaluated and treated and all questions answered.  S/p foot surgery right -Progressing as expected post-operatively. -XR: As above -WB Status: WBAT in surgical shoes -Sutures: Removed today. -Medications: none refilled. -Foot  redressed.  Return in about 2 weeks (around 10/08/2018).  No x-rays at that time

## 2018-10-22 DIAGNOSIS — E039 Hypothyroidism, unspecified: Secondary | ICD-10-CM | POA: Diagnosis not present

## 2018-10-22 DIAGNOSIS — I10 Essential (primary) hypertension: Secondary | ICD-10-CM | POA: Diagnosis not present

## 2018-10-23 ENCOUNTER — Ambulatory Visit (INDEPENDENT_AMBULATORY_CARE_PROVIDER_SITE_OTHER): Payer: Medicare Other | Admitting: Podiatry

## 2018-10-23 ENCOUNTER — Ambulatory Visit (INDEPENDENT_AMBULATORY_CARE_PROVIDER_SITE_OTHER): Payer: Medicare Other

## 2018-10-23 ENCOUNTER — Other Ambulatory Visit: Payer: Self-pay

## 2018-10-23 VITALS — Temp 96.7°F

## 2018-10-23 DIAGNOSIS — M2041 Other hammer toe(s) (acquired), right foot: Secondary | ICD-10-CM | POA: Diagnosis not present

## 2018-10-23 DIAGNOSIS — M2042 Other hammer toe(s) (acquired), left foot: Secondary | ICD-10-CM

## 2018-10-24 ENCOUNTER — Other Ambulatory Visit: Payer: Self-pay | Admitting: Psychiatry

## 2018-10-24 DIAGNOSIS — F5101 Primary insomnia: Secondary | ICD-10-CM

## 2018-10-26 ENCOUNTER — Other Ambulatory Visit: Payer: Self-pay

## 2018-10-26 MED ORDER — TRAZODONE HCL 100 MG PO TABS: ORAL_TABLET | ORAL | 0 refills | Status: DC

## 2018-11-06 ENCOUNTER — Other Ambulatory Visit: Payer: Self-pay | Admitting: Psychiatry

## 2018-11-20 ENCOUNTER — Other Ambulatory Visit: Payer: Self-pay

## 2018-11-20 ENCOUNTER — Ambulatory Visit (INDEPENDENT_AMBULATORY_CARE_PROVIDER_SITE_OTHER): Payer: Medicare Other | Admitting: Podiatry

## 2018-11-20 ENCOUNTER — Ambulatory Visit (INDEPENDENT_AMBULATORY_CARE_PROVIDER_SITE_OTHER): Payer: Medicare Other

## 2018-11-20 DIAGNOSIS — M2042 Other hammer toe(s) (acquired), left foot: Secondary | ICD-10-CM | POA: Diagnosis not present

## 2018-11-20 DIAGNOSIS — M2041 Other hammer toe(s) (acquired), right foot: Secondary | ICD-10-CM | POA: Diagnosis not present

## 2018-11-21 ENCOUNTER — Other Ambulatory Visit: Payer: Self-pay | Admitting: Psychiatry

## 2018-11-21 DIAGNOSIS — F331 Major depressive disorder, recurrent, moderate: Secondary | ICD-10-CM

## 2018-11-23 NOTE — Progress Notes (Signed)
Patient presents for postoperative followup.  Date of surgery, 09/09/2018.  Procedure was repair of second and third hammertoes, right; second, left.  Patient states that she is doing very well postoperatively, feels that the areas are healing well, denies concerns.  Reports some pain, but is not unbearable.  Presents wearing her normal shoe gear despite being told to wear surgical shoes.  Also complains of left second toenail, possible ingrown toenail of the lateral border without drainage, but with some pain.  Objective:   Constitutional Well developed. Well nourished.  Vascular Dorsalis pedis pulses palpable bilaterally. Posterior tibial pulses palpable bilaterally. Capillary refill normal to all digits. No cyanosis or clubbing noted. Pedal hair growth normal.  Neurologic Normal speech. Oriented to person, place, and time. Epicritic sensation to light touch grossly present bilaterally.  Dermatologic Skin well healed, thin scarring.  Orthopedic Mild tenderness to palpation about the surgical sites, decreasing edema.   Radiographs:  Taken and reviewed, consistent with postop state.  Fixation screw is intact without loosening.  Osteotomies appear to be bridging. Assessment/Plan:  Patient was evaluated and treated and all questions answered.  1.  Status post hammertoe correction with screws bilaterally. - Healing well, hardware intact.  At this point, discussed with patient that we can transition to normal shoe gear.  Patient given Coban for reduction of edema.  Advised on how to use.  2. Ingrown toenail, left second toe.  Nail gently debrided in slant back fashion to patient relief.

## 2018-11-24 ENCOUNTER — Other Ambulatory Visit: Payer: Self-pay | Admitting: *Deleted

## 2018-11-24 DIAGNOSIS — I83813 Varicose veins of bilateral lower extremities with pain: Secondary | ICD-10-CM

## 2018-11-25 ENCOUNTER — Ambulatory Visit: Payer: Medicare Other | Admitting: Vascular Surgery

## 2018-12-09 NOTE — Progress Notes (Signed)
Chief Complaint  Patient presents with  . Routine Post Op    Hammertoe repair left 2nd and right 2nd 43rd. Pt states more pain in right foot than left, but feels as though she is healing well and has no specific concerns.  . Toe Pain    Pt states right 1st and 2nd are rubbing on each other.     Objective:   Constitutional Well developed. Well nourished.  Vascular Dorsalis pedis pulses palpable bilaterally. Posterior tibial pulses palpable bilaterally. Capillary refill normal to all digits. No cyanosis or clubbing noted. Pedal hair growth normal.  Neurologic Normal speech. Oriented to person, place, and time. Epicritic sensation to light touch grossly present bilaterally.  Dermatologic Skin well healed, thin scarring.  Orthopedic Mild tenderness to palpation about the surgical sites, decreasing edema.   Radiographs:  Taken and reviewed, fixation intact without failure Assessment/Plan:  Patient was evaluated and treated and all questions answered.  1.  Status post hammertoe correction with screws bilaterally. -Healing well only with occasional pain and some swelling.  Discussed the use of the toe alignment splint.  Splint dispensed.  Follow-up in 6 weeks for recheck should pain persist

## 2018-12-26 ENCOUNTER — Other Ambulatory Visit: Payer: Self-pay | Admitting: Psychiatry

## 2018-12-26 DIAGNOSIS — F331 Major depressive disorder, recurrent, moderate: Secondary | ICD-10-CM

## 2018-12-31 ENCOUNTER — Other Ambulatory Visit: Payer: Self-pay

## 2018-12-31 DIAGNOSIS — F5101 Primary insomnia: Secondary | ICD-10-CM

## 2018-12-31 MED ORDER — TEMAZEPAM 30 MG PO CAPS: 30.0000 mg | ORAL_CAPSULE | Freq: Every day | ORAL | 0 refills | Status: DC

## 2019-01-07 ENCOUNTER — Ambulatory Visit: Payer: Medicare Other | Admitting: Podiatry

## 2019-01-22 ENCOUNTER — Other Ambulatory Visit: Payer: Self-pay

## 2019-01-22 ENCOUNTER — Ambulatory Visit (INDEPENDENT_AMBULATORY_CARE_PROVIDER_SITE_OTHER): Payer: Medicare Other | Admitting: Podiatry

## 2019-01-22 DIAGNOSIS — M2041 Other hammer toe(s) (acquired), right foot: Secondary | ICD-10-CM

## 2019-01-27 ENCOUNTER — Other Ambulatory Visit: Payer: Self-pay | Admitting: Psychiatry

## 2019-01-29 ENCOUNTER — Other Ambulatory Visit: Payer: Self-pay | Admitting: Psychiatry

## 2019-02-08 DIAGNOSIS — E039 Hypothyroidism, unspecified: Secondary | ICD-10-CM | POA: Diagnosis not present

## 2019-02-08 DIAGNOSIS — I10 Essential (primary) hypertension: Secondary | ICD-10-CM | POA: Diagnosis not present

## 2019-02-19 ENCOUNTER — Ambulatory Visit: Payer: Medicare Other | Admitting: Psychiatry

## 2019-02-25 ENCOUNTER — Other Ambulatory Visit: Payer: Self-pay | Admitting: Psychiatry

## 2019-02-25 DIAGNOSIS — F3342 Major depressive disorder, recurrent, in full remission: Secondary | ICD-10-CM

## 2019-02-25 DIAGNOSIS — F331 Major depressive disorder, recurrent, moderate: Secondary | ICD-10-CM

## 2019-03-07 NOTE — Progress Notes (Signed)
Chief Complaint  Patient presents with  . Routine Post Op    Pt states healing well with minimal pain. Pt states pain has greatly improved. Pt states confusion with her toe corrector and would like instruction on it.     Objective:   Constitutional Well developed. Well nourished.  Vascular Dorsalis pedis pulses palpable bilaterally. Posterior tibial pulses palpable bilaterally. Capillary refill normal to all digits. No cyanosis or clubbing noted. Pedal hair growth normal.  Neurologic Normal speech. Oriented to person, place, and time. Epicritic sensation to light touch grossly present bilaterally.  Dermatologic Skin well healed, thin scarring.  Orthopedic  no tenderness to palpation about the surgical sites, decreasing edema.   Radiographs: None today Assessment/Plan:  Patient was evaluated and treated and all questions answered.  1.  Status post hammertoe correction with screws bilaterally. -Doing very well overall with minimal pain which is greatly improved -Additional toe alignment splint dispensed.  Educated on use -Follow-up should pain persist

## 2019-03-26 ENCOUNTER — Other Ambulatory Visit: Payer: Self-pay | Admitting: Psychiatry

## 2019-03-26 DIAGNOSIS — F5101 Primary insomnia: Secondary | ICD-10-CM

## 2019-03-27 NOTE — Telephone Encounter (Signed)
Sent message to set up apt

## 2019-04-22 ENCOUNTER — Other Ambulatory Visit: Payer: Self-pay | Admitting: Psychiatry

## 2019-04-22 DIAGNOSIS — F331 Major depressive disorder, recurrent, moderate: Secondary | ICD-10-CM

## 2019-04-25 NOTE — Telephone Encounter (Signed)
Last apt 08/2018 

## 2019-04-27 ENCOUNTER — Other Ambulatory Visit: Payer: Self-pay | Admitting: Psychiatry

## 2019-05-04 DIAGNOSIS — Z1389 Encounter for screening for other disorder: Secondary | ICD-10-CM | POA: Diagnosis not present

## 2019-05-04 DIAGNOSIS — Z0000 Encounter for general adult medical examination without abnormal findings: Secondary | ICD-10-CM | POA: Diagnosis not present

## 2019-05-04 DIAGNOSIS — E039 Hypothyroidism, unspecified: Secondary | ICD-10-CM | POA: Diagnosis not present

## 2019-05-04 DIAGNOSIS — J301 Allergic rhinitis due to pollen: Secondary | ICD-10-CM | POA: Diagnosis not present

## 2019-05-05 DIAGNOSIS — I10 Essential (primary) hypertension: Secondary | ICD-10-CM | POA: Diagnosis not present

## 2019-05-05 DIAGNOSIS — E039 Hypothyroidism, unspecified: Secondary | ICD-10-CM | POA: Diagnosis not present

## 2019-05-06 ENCOUNTER — Ambulatory Visit (INDEPENDENT_AMBULATORY_CARE_PROVIDER_SITE_OTHER): Payer: Medicare Other | Admitting: Psychiatry

## 2019-05-06 ENCOUNTER — Encounter: Payer: Self-pay | Admitting: Psychiatry

## 2019-05-06 DIAGNOSIS — F331 Major depressive disorder, recurrent, moderate: Secondary | ICD-10-CM

## 2019-05-06 DIAGNOSIS — F5101 Primary insomnia: Secondary | ICD-10-CM

## 2019-05-06 DIAGNOSIS — F3342 Major depressive disorder, recurrent, in full remission: Secondary | ICD-10-CM | POA: Diagnosis not present

## 2019-05-06 MED ORDER — DESVENLAFAXINE SUCCINATE ER 50 MG PO TB24: ORAL_TABLET | ORAL | 1 refills | Status: DC

## 2019-05-06 MED ORDER — DAYVIGO 5 MG PO TABS: 5.0000 mg | ORAL_TABLET | Freq: Every day | ORAL | 0 refills | Status: DC

## 2019-05-06 MED ORDER — DESVENLAFAXINE SUCCINATE ER 100 MG PO TB24: ORAL_TABLET | ORAL | 0 refills | Status: DC

## 2019-05-06 NOTE — Progress Notes (Signed)
Debbie Schultz [id number] [birth year] 69 y.o.  Virtual Visit via Telephone Note  I connected with pt on 05/06/19 at 10:00 AM EST by telephone and verified that I am speaking with the correct person using two identifiers.   I discussed the limitations, risks, security and privacy concerns of performing an evaluation and management service by telephone and the availability of in person appointments. I also discussed with the patient that there may be a patient responsible charge related to this service. The patient expressed understanding and agreed to proceed.   I discussed the assessment and treatment plan with the patient. The patient was provided an opportunity to ask questions and all were answered. The patient agreed with the plan and demonstrated an understanding of the instructions.   The patient was advised to call back or seek an in-person evaluation if the symptoms worsen or if the condition fails to improve as anticipated.  I provided 30 minutes of non-face-to-face time during this encounter.  The patient was located at home.  The provider was located at [facility].   Corie Chiquito, PMHNP   Subjective:   Patient ID:  Debbie Schultz is a 69 y.o. (DOB [birth year]) female.  Chief Complaint:  Chief Complaint  Patient presents with  . Insomnia  . Depression  . Anxiety    HPI Debbie Schultz presents for follow-up of depression. She reports that one of her cats died yesterday after having multiple treatments at the vet all week. She reports that this was one of her favorite cats that stayed close to her all the time. She reports that she has been grieving the loss of her cat and has had significant sadness. She reports, "I wouldn't say I'm depressed, but I have been upset by some events with the past year." She reports that she has been having some anxiety and "angst" in response to the pandemic and world events. She reports that she will have to go to another room  when her sisters watches the news. She reports that she has had some difficulty falling asleep. Has been increasing snack intake to help with sleep initiation. She has tried taking an additional 1/4 of Trazodone when sleep has been poor and then has had excessive somnolence the next day. Reports that she typically stays asleep once she falls asleep. Sleeping 7-9 hours with some awakenings. Denies any increased anxiety before going to bed. Appetite has been "too good" and has been trying to eat healthier. Has been trying to go on a walk with her sister daily. She reports that her energy and motivation have been low. Adequate concentration and reports that she enjoys reading. Denies SI.   Had toe surgery in July and had pins in her toes and has been recovering from this. Had first COVID vaccination 04/24/19.   She reports that she and her sister bought a Educational psychologist and have been using this to take road trips. Planning trip to see brother in [place].   Review of Systems:  Review of Systems  Musculoskeletal: Negative for gait problem.       Pain with toe after surgery  Neurological: Negative for tremors and headaches.  Psychiatric/Behavioral:       Please refer to HPI   Had annual exam last week.   Medications: I have reviewed the patient's current medications.  Current Outpatient Medications  Medication Sig Dispense Refill  . aspirin EC 81 MG tablet Take 81 mg by mouth daily.    . Cholecalciferol (VITAMIN D3) 10000 units  TABS Take by mouth.    . desvenlafaxine (PRISTIQ) 100 MG 24 hr tablet TAKE 1 TABLET BY MOUTH DAILY ALONG WITH 50 MG TABLET TO EQUAL TOTAL DOSE OF 150 MG 90 tablet 0  . desvenlafaxine (PRISTIQ) 50 MG 24 hr tablet TAKE 1 TABLET BY MOUTH DAILY, TAKE WITH 100 MG TABLET TO EQUAL 150 MG DAILY 90 tablet 1  . fluticasone (FLONASE) 50 MCG/ACT nasal spray instill 1 spray into each nostril once daily  0  . L-Methylfolate-Algae (DEPLIN 15 PO) Take 15 mg by mouth daily.    Marland Kitchen  L-Methylfolate-Algae (DEPLIN 15) 15-90.314 MG CAPS TAKE 1 CAPSULE BY MOUTH EVERY DAY 90 capsule 0  . liothyronine (CYTOMEL) 5 MCG tablet TK 2 TS PO QAM    . Multiple Vitamin (MULTIVITAMIN) capsule Take 1 capsule by mouth daily.    . OMEGA-3 FATTY ACIDS PO Take by mouth.    . propranolol (INDERAL) 10 MG tablet Take 10 mg by mouth 2 (two) times daily.     . temazepam (RESTORIL) 30 MG capsule TAKE 1 CAPSULE(30 MG) BY MOUTH AT BEDTIME 90 capsule 0  . traZODone (DESYREL) 100 MG tablet TAKE 1 TO 2 TABLETS BY MOUTH AT BEDTIME FOR SLEEP 180 tablet 0  . zinc gluconate 50 MG tablet Take 50 mg by mouth daily.    Marland Kitchen amoxicillin (AMOXIL) 500 MG tablet TK 4 TS PO 1 HOUR B TREATMENT    . clotrimazole-betamethasone (LOTRISONE) cream     . L-Methylfolate-Algae (DEPLIN 7.5 PO) Take by mouth.    . Lemborexant (DAYVIGO) 5 MG TABS Take 5 mg by mouth at bedtime. May take 10 mg po QHS if needed 20 tablet 0  . LIOTHYRONINE SODIUM PO Take 1 tablet by mouth daily.     . Multiple Vitamins-Minerals (ZINC PO) Take by mouth.     No current facility-administered medications for this visit.    Medication Side Effects: Other: Grogginess with taking additional Trazodone  Allergies:  Allergies  Allergen Reactions  . Symbicort [Budesonide-Formoterol Fumarate] Nausea Only  . Bupropion Rash  . Sulfamethoxazole Rash    Past Medical History:  Diagnosis Date  . Anxiety   . Arthritis    bilateral knees  . Back pain   . Depression   . Dysrhythmia    "high heart rate".  seen last Dec 2018  . Headache    resolved  . Hypothyroid   . Insomnia     Family History  Problem Relation Age of Onset  . Heart disease Mother        PPM  . Heart attack Father   . Diabetes Maternal Aunt   . Stroke Paternal Grandmother   . Headache Neg Hx     Social History   Socioeconomic History  . Marital status: Unknown    Spouse name: Not on file  . Number of children: 0  . Years of education: Masters  . Highest education level:  Not on file  Occupational History  . Occupation: Retired    Comment: Runner, broadcasting/film/video  Tobacco Use  . Smoking status: Never Smoker  . Smokeless tobacco: Never Used  Substance and Sexual Activity  . Alcohol use: Yes    Alcohol/week: 0.0 standard drinks    Comment: once a month  . Drug use: No  . Sexual activity: Not on file  Other Topics Concern  . Not on file  Social History Narrative   Lives with mother.   Caffeine use: 1 cup coffee every morning   Tea rare  Social Determinants of Health   Financial Resource Strain:   . Difficulty of Paying Living Expenses: Not on file  Food Insecurity:   . Worried About Charity fundraiser in the Last Year: Not on file  . Ran Out of Food in the Last Year: Not on file  Transportation Needs:   . Lack of Transportation (Medical): Not on file  . Lack of Transportation (Non-Medical): Not on file  Physical Activity:   . Days of Exercise per Week: Not on file  . Minutes of Exercise per Session: Not on file  Stress:   . Feeling of Stress : Not on file  Social Connections:   . Frequency of Communication with Friends and Family: Not on file  . Frequency of Social Gatherings with Friends and Family: Not on file  . Attends Religious Services: Not on file  . Active Member of Clubs or Organizations: Not on file  . Attends Archivist Meetings: Not on file  . Marital Status: Not on file  Intimate Partner Violence:   . Fear of Current or Ex-Partner: Not on file  . Emotionally Abused: Not on file  . Physically Abused: Not on file  . Sexually Abused: Not on file    Past Medical History, Surgical history, Social history, and Family history were reviewed and updated as appropriate.   Please see review of systems for further details on the patient's review from today.   Objective:   Physical Exam:  There were no vitals taken for this visit.  Physical Exam Neurological:     Mental Status: She is alert and oriented to person, place, and time.      Cranial Nerves: No dysarthria.  Psychiatric:        Attention and Perception: Attention and perception normal.        Mood and Affect: Mood is depressed.        Speech: Speech normal.        Behavior: Behavior is cooperative.        Thought Content: Thought content normal. Thought content is not paranoid or delusional. Thought content does not include homicidal or suicidal ideation. Thought content does not include homicidal or suicidal plan.        Cognition and Memory: Cognition and memory normal.        Judgment: Judgment normal.     Comments: Insight intact     Lab Review:     Component Value Date/Time   NA 140 03/19/2018 2330   K 3.4 (L) 03/19/2018 2330   CL 110 03/19/2018 2330   CO2 21 (L) 03/19/2018 2330   GLUCOSE 171 (H) 03/19/2018 2330   BUN 13 03/19/2018 2330   CREATININE 0.99 03/19/2018 2330   CALCIUM 9.0 03/19/2018 2330   PROT 6.6 11/29/2015 1456   ALBUMIN 3.9 11/29/2015 1456   AST 17 11/29/2015 1456   ALT 34 11/29/2015 1456   ALKPHOS 70 11/29/2015 1456   BILITOT 0.3 11/29/2015 1456   GFRNONAA 59 (L) 03/19/2018 2330   GFRAA >60 03/19/2018 2330       Component Value Date/Time   WBC 7.4 03/19/2018 2330   RBC 4.58 03/19/2018 2330   HGB 14.2 03/19/2018 2330   HCT 43.3 03/19/2018 2330   PLT 283 03/19/2018 2330   MCV 94.5 03/19/2018 2330   MCH 31.0 03/19/2018 2330   MCHC 32.8 03/19/2018 2330   RDW 11.9 03/19/2018 2330   LYMPHSABS 2.2 03/19/2018 2330   MONOABS 0.6 03/19/2018 2330   EOSABS  0.2 03/19/2018 2330   BASOSABS 0.1 03/19/2018 2330    No results found for: POCLITH, LITHIUM   No results found for: PHENYTOIN, PHENOBARB, VALPROATE, CBMZ   .res Assessment: Plan:   Pt seen for 30 minutes and time spent counseling re: options for insomnia to include discussing potential benefits, risks, and side effects of Dayvigo.  Patient agrees to trial of Dayvigo and will pick up samples from the office.  Will start Dayvigo 5 mg p.o. nightly and may increase to  10 mg at bedtime after 3-5 nights if 5 mg is ineffective and well-tolerated. Discussed decreasing trazodone to 50 mg while starting Dayvigo since trazodone no longer seems to be effective for her sleep.  Discussed considering further reduction in sleep medications if Dayvigo is effective. Patient to follow-up in 4 weeks or sooner if clinically indicated. Patient advised to contact office with any questions, adverse effects, or acute worsening in signs and symptoms.  Debbie Schultz was seen today for insomnia, depression and anxiety.  Diagnoses and all orders for this visit:  Primary insomnia -     Lemborexant (DAYVIGO) 5 MG TABS; Take 5 mg by mouth at bedtime. May take 10 mg po QHS if needed  Recurrent major depressive disorder, in full remission (HCC) -     desvenlafaxine (PRISTIQ) 100 MG 24 hr tablet; TAKE 1 TABLET BY MOUTH DAILY ALONG WITH 50 MG TABLET TO EQUAL TOTAL DOSE OF 150 MG  Moderate episode of recurrent major depressive disorder (HCC) -     desvenlafaxine (PRISTIQ) 50 MG 24 hr tablet; TAKE 1 TABLET BY MOUTH DAILY, TAKE WITH 100 MG TABLET TO EQUAL 150 MG DAILY    Please see After Visit Summary for patient specific instructions.  Future Appointments  Date Time Provider Department Center  06/03/2019  1:00 PM Corie Chiquito, PMHNP [facility] None    No orders of the defined types were placed in this encounter.     -------------------------------

## 2019-05-10 DIAGNOSIS — M8588 Other specified disorders of bone density and structure, other site: Secondary | ICD-10-CM | POA: Diagnosis not present

## 2019-05-10 DIAGNOSIS — R32 Unspecified urinary incontinence: Secondary | ICD-10-CM | POA: Diagnosis not present

## 2019-05-10 DIAGNOSIS — Z01419 Encounter for gynecological examination (general) (routine) without abnormal findings: Secondary | ICD-10-CM | POA: Diagnosis not present

## 2019-05-12 DIAGNOSIS — Z1231 Encounter for screening mammogram for malignant neoplasm of breast: Secondary | ICD-10-CM | POA: Diagnosis not present

## 2019-06-03 ENCOUNTER — Ambulatory Visit: Payer: Medicare Other | Admitting: Psychiatry

## 2019-06-21 DIAGNOSIS — L989 Disorder of the skin and subcutaneous tissue, unspecified: Secondary | ICD-10-CM | POA: Diagnosis not present

## 2019-06-28 ENCOUNTER — Other Ambulatory Visit: Payer: Self-pay

## 2019-06-28 DIAGNOSIS — F5101 Primary insomnia: Secondary | ICD-10-CM

## 2019-06-29 MED ORDER — TEMAZEPAM 30 MG PO CAPS: ORAL_CAPSULE | ORAL | 0 refills | Status: DC

## 2019-07-21 ENCOUNTER — Other Ambulatory Visit: Payer: Self-pay | Admitting: Psychiatry

## 2019-07-26 DIAGNOSIS — L570 Actinic keratosis: Secondary | ICD-10-CM | POA: Diagnosis not present

## 2019-07-26 DIAGNOSIS — C44311 Basal cell carcinoma of skin of nose: Secondary | ICD-10-CM | POA: Diagnosis not present

## 2019-07-26 DIAGNOSIS — Z85828 Personal history of other malignant neoplasm of skin: Secondary | ICD-10-CM | POA: Diagnosis not present

## 2019-08-10 ENCOUNTER — Other Ambulatory Visit: Payer: Self-pay

## 2019-08-10 DIAGNOSIS — F3342 Major depressive disorder, recurrent, in full remission: Secondary | ICD-10-CM

## 2019-08-10 DIAGNOSIS — F331 Major depressive disorder, recurrent, moderate: Secondary | ICD-10-CM

## 2019-08-10 MED ORDER — DESVENLAFAXINE SUCCINATE ER 50 MG PO TB24: ORAL_TABLET | ORAL | 0 refills | Status: DC

## 2019-08-10 MED ORDER — DESVENLAFAXINE SUCCINATE ER 100 MG PO TB24: ORAL_TABLET | ORAL | 0 refills | Status: DC

## 2019-08-19 DIAGNOSIS — C44311 Basal cell carcinoma of skin of nose: Secondary | ICD-10-CM | POA: Diagnosis not present

## 2019-08-19 DIAGNOSIS — Z85828 Personal history of other malignant neoplasm of skin: Secondary | ICD-10-CM | POA: Diagnosis not present

## 2019-09-07 DIAGNOSIS — Z85828 Personal history of other malignant neoplasm of skin: Secondary | ICD-10-CM | POA: Diagnosis not present

## 2019-09-07 DIAGNOSIS — L281 Prurigo nodularis: Secondary | ICD-10-CM | POA: Diagnosis not present

## 2019-09-07 DIAGNOSIS — L859 Epidermal thickening, unspecified: Secondary | ICD-10-CM | POA: Diagnosis not present

## 2019-09-07 DIAGNOSIS — D485 Neoplasm of uncertain behavior of skin: Secondary | ICD-10-CM | POA: Diagnosis not present

## 2019-09-07 DIAGNOSIS — L821 Other seborrheic keratosis: Secondary | ICD-10-CM | POA: Diagnosis not present

## 2019-09-15 ENCOUNTER — Other Ambulatory Visit: Payer: Self-pay

## 2019-09-15 DIAGNOSIS — F5101 Primary insomnia: Secondary | ICD-10-CM

## 2019-09-20 ENCOUNTER — Telehealth: Payer: Self-pay | Admitting: Psychiatry

## 2019-09-20 ENCOUNTER — Other Ambulatory Visit: Payer: Self-pay

## 2019-09-20 MED ORDER — TEMAZEPAM 30 MG PO CAPS: ORAL_CAPSULE | ORAL | 0 refills | Status: DC

## 2019-09-20 MED ORDER — TRAZODONE HCL 100 MG PO TABS: ORAL_TABLET | ORAL | 0 refills | Status: DC

## 2019-09-20 NOTE — Telephone Encounter (Signed)
Refill requests submitted to pharmacy

## 2019-09-20 NOTE — Telephone Encounter (Signed)
Pt made appt for 8/2.  Pt would like her rx for Temazepam, trazodone sent back through. Send to [facility] on [address].

## 2019-09-21 ENCOUNTER — Telehealth: Payer: Self-pay | Admitting: Psychiatry

## 2019-09-21 ENCOUNTER — Other Ambulatory Visit: Payer: Self-pay

## 2019-09-21 DIAGNOSIS — F5101 Primary insomnia: Secondary | ICD-10-CM

## 2019-09-21 MED ORDER — TEMAZEPAM 30 MG PO CAPS: ORAL_CAPSULE | ORAL | 0 refills | Status: DC

## 2019-09-21 MED ORDER — TRAZODONE HCL 100 MG PO TABS: ORAL_TABLET | ORAL | 0 refills | Status: DC

## 2019-09-21 NOTE — Telephone Encounter (Signed)
Rx's were submitted locally yesterday. Patient has apt 10/11/2019 scheduled with Shanda Bumps  Calling today requesting to be sent to [place] instead.

## 2019-09-21 NOTE — Telephone Encounter (Signed)
Pt called to report she is @ [place] [facility]. Asking you to call [facility] there @ [phone] so pharmacy will fill Rx for Temazepam 30 mg. Trazadone Rx already taken care of.

## 2019-10-11 ENCOUNTER — Encounter: Payer: Self-pay | Admitting: Psychiatry

## 2019-10-11 ENCOUNTER — Other Ambulatory Visit: Payer: Self-pay

## 2019-10-11 ENCOUNTER — Ambulatory Visit (INDEPENDENT_AMBULATORY_CARE_PROVIDER_SITE_OTHER): Payer: Medicare Other | Admitting: Psychiatry

## 2019-10-11 VITALS — HR 90

## 2019-10-11 DIAGNOSIS — F3342 Major depressive disorder, recurrent, in full remission: Secondary | ICD-10-CM | POA: Diagnosis not present

## 2019-10-11 DIAGNOSIS — F331 Major depressive disorder, recurrent, moderate: Secondary | ICD-10-CM | POA: Diagnosis not present

## 2019-10-11 DIAGNOSIS — F5101 Primary insomnia: Secondary | ICD-10-CM

## 2019-10-11 MED ORDER — DESVENLAFAXINE SUCCINATE ER 50 MG PO TB24: ORAL_TABLET | ORAL | 1 refills | Status: DC

## 2019-10-11 MED ORDER — TRAZODONE HCL 100 MG PO TABS: ORAL_TABLET | ORAL | 1 refills | Status: DC

## 2019-10-11 MED ORDER — DEPLIN 15 15-90.314 MG PO CAPS: 1.0000 | ORAL_CAPSULE | Freq: Every day | ORAL | 3 refills | Status: DC

## 2019-10-11 MED ORDER — DESVENLAFAXINE SUCCINATE ER 100 MG PO TB24: ORAL_TABLET | ORAL | 0 refills | Status: DC

## 2019-10-11 NOTE — Progress Notes (Signed)
Debbie Schultz [id number] [birth year] 69 y.o.  Subjective:   Patient ID:  Debbie Schultz is a 69 y.o. (DOB Oct 05, 1950) female.  Chief Complaint:  Chief Complaint  Patient presents with  . Follow-up    h/o Depression, anxiety, and insomnia    HPI Debbie Schultz presents to the office today for follow-up of depression, anxiety, and insomnia. She reports that she continued with Temazepam. Tried Dayvigo x 4 and does not recall response. She would like to try it again since she will be home for a few weeks and this would be helpful to new med trial. She reports that she has been sleeping well but needs to eat a bedtime snack. Appetite is ok during the day. Estimates sleeping 6-8 hours a night. She reports that she does not set an alarm in the morning. Mood has been ok. Anxiety has been ok other than some anxiety in response to COVID. Cat has been ill and she has been wanting to stay and hold her cat. Energy and motivation for other things is lower because that is what she wants to do most. Concentration has been adequate. Denies SI.   She reports that she and her sister have taken 5 trips in their camper/trailer. They plan on going to see their brother in [place] in about 5 weeks.    Has decided to eat only vegetarian food.   Review of Systems:  Review of Systems  Gastrointestinal: Negative.   Musculoskeletal: Positive for arthralgias. Negative for gait problem.  Neurological: Negative for tremors.  Psychiatric/Behavioral:       Please refer to HPI    Had basal cell carcinoma removed from her nose.   Medications: I have reviewed the patient's current medications.  Current Outpatient Medications  Medication Sig Dispense Refill  . aspirin EC 81 MG tablet Take 81 mg by mouth daily.    . Cholecalciferol (VITAMIN D3) 10000 units TABS Take by mouth.    . clotrimazole-betamethasone (LOTRISONE) cream     . desvenlafaxine (PRISTIQ) 100 MG 24 hr tablet TAKE 1 TABLET BY MOUTH DAILY ALONG  WITH 50 MG TABLET TO EQUAL TOTAL DOSE OF 150 MG 90 tablet 0  . desvenlafaxine (PRISTIQ) 50 MG 24 hr tablet TAKE 1 TABLET BY MOUTH DAILY, TAKE WITH 100 MG TABLET TO EQUAL 150 MG DAILY 90 tablet 1  . fluticasone (FLONASE) 50 MCG/ACT nasal spray instill 1 spray into each nostril once daily  0  . liothyronine (CYTOMEL) 5 MCG tablet TK 2 TS PO QAM    . LIOTHYRONINE SODIUM PO Take 1 tablet by mouth daily.     . Multiple Vitamin (MULTIVITAMIN) capsule Take 1 capsule by mouth daily.    . Multiple Vitamins-Minerals (ZINC PO) Take by mouth.    . OMEGA-3 FATTY ACIDS PO Take by mouth.    . propranolol (INDERAL) 10 MG tablet Take 10 mg by mouth 2 (two) times daily.     . temazepam (RESTORIL) 30 MG capsule TAKE 1 CAPSULE(30 MG) BY MOUTH AT BEDTIME 90 capsule 0  . traZODone (DESYREL) 100 MG tablet TAKE 1 TO 2 TABLETS BY MOUTH AT BEDTIME FOR SLEEP 180 tablet 1  . zinc gluconate 50 MG tablet Take 50 mg by mouth daily.    Debbie Schultz Kitchen amoxicillin (AMOXIL) 500 MG tablet TK 4 TS PO 1 HOUR B TREATMENT    . L-Methylfolate-Algae (DEPLIN 15 PO) Take 15 mg by mouth daily.    Debbie Schultz Kitchen L-Methylfolate-Algae (DEPLIN 15) 15-90.314 MG CAPS Take 1 tablet by mouth daily. 90  capsule 3  . Lemborexant (DAYVIGO) 5 MG TABS Take 5 mg by mouth at bedtime. May take 10 mg po QHS if needed (Patient not taking: Reported on 10/11/2019) 20 tablet 0   No current facility-administered medications for this visit.    Medication Side Effects: Other: Hunger after bedtime medications  Allergies:  Allergies  Allergen Reactions  . Symbicort [Budesonide-Formoterol Fumarate] Nausea Only  . Bupropion Rash  . Sulfamethoxazole Rash    Past Medical History:  Diagnosis Date  . Anxiety   . Arthritis    bilateral knees  . Back pain   . Depression   . Dysrhythmia    "high heart rate".  seen last Dec 2018  . Headache    resolved  . Hypothyroid   . Insomnia     Family History  Problem Relation Age of Onset  . Heart disease Mother        PPM  . Heart  attack Father   . Diabetes Maternal Aunt   . Stroke Paternal Grandmother   . Headache Neg Hx     Social History   Socioeconomic History  . Marital status: Unknown    Spouse name: Not on file  . Number of children: 0  . Years of education: Masters  . Highest education level: Not on file  Occupational History  . Occupation: Retired    Comment: Runner, broadcasting/film/video  Tobacco Use  . Smoking status: Never Smoker  . Smokeless tobacco: Never Used  Vaping Use  . Vaping Use: Never used  Substance and Sexual Activity  . Alcohol use: Yes    Alcohol/week: 0.0 standard drinks    Comment: once a month  . Drug use: No  . Sexual activity: Not on file  Other Topics Concern  . Not on file  Social History Narrative   Lives with mother.   Caffeine use: 1 cup coffee every morning   Tea rare   Social Determinants of Health   Financial Resource Strain:   . Difficulty of Paying Living Expenses:   Food Insecurity:   . Worried About Programme researcher, broadcasting/film/video in the Last Year:   . Barista in the Last Year:   Transportation Needs:   . Freight forwarder (Medical):   Debbie Schultz Kitchen Lack of Transportation (Non-Medical):   Physical Activity:   . Days of Exercise per Week:   . Minutes of Exercise per Session:   Stress:   . Feeling of Stress :   Social Connections:   . Frequency of Communication with Friends and Family:   . Frequency of Social Gatherings with Friends and Family:   . Attends Religious Services:   . Active Member of Clubs or Organizations:   . Attends Banker Meetings:   Debbie Schultz Kitchen Marital Status:   Intimate Partner Violence:   . Fear of Current or Ex-Partner:   . Emotionally Abused:   Debbie Schultz Kitchen Physically Abused:   . Sexually Abused:     Past Medical History, Surgical history, Social history, and Family history were reviewed and updated as appropriate.   Please see review of systems for further details on the patient's review from today.   Objective:   Physical Exam:  Pulse 90    Physical Exam Constitutional:      General: She is not in acute distress. Musculoskeletal:        General: No deformity.  Neurological:     Mental Status: She is alert and oriented to person, place, and time.  Coordination: Coordination normal.  Psychiatric:        Attention and Perception: Attention and perception normal. She does not perceive auditory or visual hallucinations.        Mood and Affect: Mood normal. Mood is not anxious or depressed. Affect is not labile, blunt, angry or inappropriate.        Speech: Speech normal.        Behavior: Behavior normal.        Thought Content: Thought content normal. Thought content is not paranoid or delusional. Thought content does not include homicidal or suicidal ideation. Thought content does not include homicidal or suicidal plan.        Cognition and Memory: Cognition and memory normal.        Judgment: Judgment normal.     Comments: Insight intact     Lab Review:     Component Value Date/Time   NA 140 03/19/2018 2330   K 3.4 (L) 03/19/2018 2330   CL 110 03/19/2018 2330   CO2 21 (L) 03/19/2018 2330   GLUCOSE 171 (H) 03/19/2018 2330   BUN 13 03/19/2018 2330   CREATININE 0.99 03/19/2018 2330   CALCIUM 9.0 03/19/2018 2330   PROT 6.6 11/29/2015 1456   ALBUMIN 3.9 11/29/2015 1456   AST 17 11/29/2015 1456   ALT 34 11/29/2015 1456   ALKPHOS 70 11/29/2015 1456   BILITOT 0.3 11/29/2015 1456   GFRNONAA 59 (L) 03/19/2018 2330   GFRAA >60 03/19/2018 2330       Component Value Date/Time   WBC 7.4 03/19/2018 2330   RBC 4.58 03/19/2018 2330   HGB 14.2 03/19/2018 2330   HCT 43.3 03/19/2018 2330   PLT 283 03/19/2018 2330   MCV 94.5 03/19/2018 2330   MCH 31.0 03/19/2018 2330   MCHC 32.8 03/19/2018 2330   RDW 11.9 03/19/2018 2330   LYMPHSABS 2.2 03/19/2018 2330   MONOABS 0.6 03/19/2018 2330   EOSABS 0.2 03/19/2018 2330   BASOSABS 0.1 03/19/2018 2330    No results found for: POCLITH, LITHIUM   No results found for:  PHENYTOIN, PHENOBARB, VALPROATE, CBMZ   .res Assessment: Plan:   Patient reports that she would like to try Dayvigo again for several consecutive nights since she will be home for the next few weeks and did not want to start Dayvigo trial while traveling.  Patient to start Dayvigo 5 mg at bedtime for 5 nights, then increase to 10 mg at bedtime as tolerated if sleep is not approved. Continue Pristiq 150 mg daily for depression. Continue l-methylfolate for depression. Continue trazodone as needed for insomnia. Patient advised to contact office if sleep is unimproved. Patient follow-up in 6 months or sooner if clinically indicated. Patient advised to contact office with any questions, adverse effects, or acute worsening in signs and symptoms.  Samara DeistKathryn was seen today for follow-up.  Diagnoses and all orders for this visit:  Primary insomnia -     traZODone (DESYREL) 100 MG tablet; TAKE 1 TO 2 TABLETS BY MOUTH AT BEDTIME FOR SLEEP  Recurrent major depressive disorder, in full remission (HCC) -     desvenlafaxine (PRISTIQ) 100 MG 24 hr tablet; TAKE 1 TABLET BY MOUTH DAILY ALONG WITH 50 MG TABLET TO EQUAL TOTAL DOSE OF 150 MG -     desvenlafaxine (PRISTIQ) 50 MG 24 hr tablet; TAKE 1 TABLET BY MOUTH DAILY, TAKE WITH 100 MG TABLET TO EQUAL 150 MG DAILY -     L-Methylfolate-Algae (DEPLIN 15) 15-90.314 MG CAPS; Take 1  tablet by mouth daily.  Moderate episode of recurrent major depressive disorder (HCC) -     desvenlafaxine (PRISTIQ) 50 MG 24 hr tablet; TAKE 1 TABLET BY MOUTH DAILY, TAKE WITH 100 MG TABLET TO EQUAL 150 MG DAILY     Please see After Visit Summary for patient specific instructions.  Future Appointments  Date Time Provider Department Center  04/12/2020 11:00 AM Corie Chiquito, PMHNP [facility] None    No orders of the defined types were placed in this encounter.   -------------------------------

## 2019-10-26 ENCOUNTER — Telehealth: Payer: Self-pay | Admitting: Psychiatry

## 2019-10-26 NOTE — Telephone Encounter (Signed)
Olegario Messier called to report that the Oliva Bustard is doing well and she would like a prescription called into her pharmacy.  [facility] at [address].

## 2019-10-27 ENCOUNTER — Other Ambulatory Visit: Payer: Self-pay

## 2019-10-27 DIAGNOSIS — F5101 Primary insomnia: Secondary | ICD-10-CM

## 2019-10-27 MED ORDER — DAYVIGO 5 MG PO TABS: 5.0000 mg | ORAL_TABLET | Freq: Every day | ORAL | 0 refills | Status: DC

## 2019-10-27 NOTE — Telephone Encounter (Signed)
Pt will need Dayvigo 5mg  and wants to get a 90 day supply.

## 2019-10-27 NOTE — Telephone Encounter (Signed)
90 day supply called into her pharmacy

## 2019-10-27 NOTE — Telephone Encounter (Signed)
LM to call back and confirm dose of Dayvigo, either 5 mg or 10 mg

## 2019-10-29 ENCOUNTER — Other Ambulatory Visit: Payer: Self-pay

## 2019-10-29 ENCOUNTER — Telehealth: Payer: Self-pay | Admitting: Psychiatry

## 2019-10-29 DIAGNOSIS — F5101 Primary insomnia: Secondary | ICD-10-CM

## 2019-10-29 MED ORDER — DAYVIGO 5 MG PO TABS: 5.0000 mg | ORAL_TABLET | Freq: Every day | ORAL | 0 refills | Status: DC

## 2019-10-29 NOTE — Telephone Encounter (Signed)
Pt called stating [facility] on [address]  Did not receive refill request called in for [facility].

## 2019-11-01 ENCOUNTER — Telehealth: Payer: Self-pay | Admitting: Psychiatry

## 2019-11-01 NOTE — Telephone Encounter (Signed)
Prior authorization submitted through [facility] for Dayvigo, pending response at this time.

## 2019-11-01 NOTE — Telephone Encounter (Signed)
Pt called stating pharmacy advised she needs PA for Dayvigo 5 mg. Will be going out of town and only has 4 pills left asking for 1 box of samples until she get back in town.

## 2019-11-01 NOTE — Telephone Encounter (Signed)
I will also check on a PA for her. Haven't received one for that.

## 2019-11-01 NOTE — Telephone Encounter (Signed)
Because we don't ;have any samples to give her while they go out of town and while we get the PA I have printed off a free trial coupon for the patient.  But it is only a 10 day trail so a prescription for 10 days will be need to be sent to [facility] for her to use the coupon with.  Please send this in asap so they can get the medication before they leave tomorrow around noon

## 2019-11-02 NOTE — Telephone Encounter (Signed)
Prior authorization submitted and approved for DAYVIGO 5 MG through [facility] Medicare Part D effective 11/02/2019-10/31/2020

## 2019-11-12 ENCOUNTER — Ambulatory Visit (INDEPENDENT_AMBULATORY_CARE_PROVIDER_SITE_OTHER): Payer: Medicare Other

## 2019-11-12 ENCOUNTER — Ambulatory Visit: Payer: Medicare Other | Admitting: Podiatry

## 2019-11-12 ENCOUNTER — Other Ambulatory Visit: Payer: Self-pay

## 2019-11-12 DIAGNOSIS — M205X1 Other deformities of toe(s) (acquired), right foot: Secondary | ICD-10-CM | POA: Diagnosis not present

## 2019-11-12 DIAGNOSIS — M2041 Other hammer toe(s) (acquired), right foot: Secondary | ICD-10-CM | POA: Diagnosis not present

## 2019-11-12 DIAGNOSIS — M545 Low back pain: Secondary | ICD-10-CM | POA: Diagnosis not present

## 2019-11-12 DIAGNOSIS — M2042 Other hammer toe(s) (acquired), left foot: Secondary | ICD-10-CM

## 2019-11-12 DIAGNOSIS — M205X2 Other deformities of toe(s) (acquired), left foot: Secondary | ICD-10-CM

## 2019-11-12 NOTE — Progress Notes (Signed)
  Subjective:  Patient ID: Debbie Schultz, female    DOB: [birth year],  MRN: [id number]  No chief complaint on file.   69 y.o. female presents for f/u of toe surgery. States her right 2nd/3rrd toes and left 2nd toes have been hurting, especially when hiking. Statres they are getting better but still sore. Pain rated at 5/10. Worse with closed toed shoes, occasional swelling. Using toe splint without relief.  Objective:  Physical Exam: warm, good capillary refill, no trophic changes or ulcerative lesions, normal DP and PT pulses and normal sensory exam. Left Foot: POP 2nd MPJ no edema noted Right Foot: 2nd/3rd toes rectus. POP 2nd/3rd MPJ. Toes do not purchase ground. Hallux interphalangeus deformity. Good ROM 1st MPJ. No edema noted.  No images are attached to the encounter.  Radiographs: X-ray of both feet: well corrected hammertoes with complete PIPJ arthrodesis noted. Hardware intact without failure. No acute fractures. Assessment:   1. Contracture of toe of right foot   2. Hammertoe of right foot   3. Hammer toe of left foot   4. Contracture of toe of left foot      Plan:  Patient was evaluated and treated and all questions answered.  Hammertoes bilat s/p correction -XR reviewed with patient -Educated on etiology of deformity  -Dispense toe spacer right -Consider right foot Akin osteotomy, 2nd Weil, ROHx2, 3rd MPJ capsulotomy -Patient to think this over.  No follow-ups on file.

## 2020-01-07 DIAGNOSIS — I10 Essential (primary) hypertension: Secondary | ICD-10-CM | POA: Diagnosis not present

## 2020-01-07 DIAGNOSIS — E039 Hypothyroidism, unspecified: Secondary | ICD-10-CM | POA: Diagnosis not present

## 2020-01-07 DIAGNOSIS — K219 Gastro-esophageal reflux disease without esophagitis: Secondary | ICD-10-CM | POA: Diagnosis not present

## 2020-01-18 ENCOUNTER — Ambulatory Visit (INDEPENDENT_AMBULATORY_CARE_PROVIDER_SITE_OTHER): Payer: Medicare Other | Admitting: Podiatry

## 2020-01-18 ENCOUNTER — Other Ambulatory Visit: Payer: Self-pay

## 2020-01-18 DIAGNOSIS — E669 Obesity, unspecified: Secondary | ICD-10-CM | POA: Diagnosis not present

## 2020-01-18 DIAGNOSIS — M205X1 Other deformities of toe(s) (acquired), right foot: Secondary | ICD-10-CM

## 2020-01-18 DIAGNOSIS — M2041 Other hammer toe(s) (acquired), right foot: Secondary | ICD-10-CM | POA: Diagnosis not present

## 2020-01-18 DIAGNOSIS — M2042 Other hammer toe(s) (acquired), left foot: Secondary | ICD-10-CM | POA: Diagnosis not present

## 2020-01-18 NOTE — Progress Notes (Signed)
  Subjective:  Patient ID: Debbie Schultz, female    DOB: 1950-08-08,  MRN: [id number]  Chief Complaint  Patient presents with  . Foot Pain    Right 2nd/3rd and left 2nd toe pain, surgical consult.    69 y.o. female presents for f/u of toe surgery. Still having pain, would like to proceed with surgery. States that she had to put spacers and moleskine in between her toes to prevent the pain but it was not sufficient.  Objective:  Physical Exam: warm, good capillary refill, no trophic changes or ulcerative lesions, normal DP and PT pulses and normal sensory exam. Left Foot: No POP 2nd MPJ, POP 2nd toe with dorsiflexion, does not purchase ground. Right Foot: 2nd/3rd toes rectus. POP 2nd/3rd MPJ. Toes do not purchase ground. Hallux interphalangeus deformity. Good ROM 1st MPJ. No edema noted.  No images are attached to the encounter.  Radiographs: 11/20/18 X-ray of both feet: well corrected hammertoes with complete PIPJ arthrodesis noted. Hardware intact without failure. No acute fractures. Assessment:   1. Hammertoe of right foot   2. Hammer toe of left foot   3. Contracture of toe of right foot   4. Obesity without serious comorbidity, unspecified classification, unspecified obesity type      Plan:  Patient was evaluated and treated and all questions answered.  Hammertoes bilat s/p correction -Patient would like to proceed with surgery today. -Patient has failed all conservative therapy and wishes to proceed with surgical intervention. All risks, benefits, and alternatives discussed with patient. No guarantees given. Consent reviewed and signed by patient. -Planned procedures:  right foot Akin osteotomy, 2nd/3rd MPJ capsulotomy, left 2nd tenotomy and capsulotomy, removal of hardware 2L, 2R, 3R  No follow-ups on file.

## 2020-01-24 ENCOUNTER — Telehealth: Payer: Self-pay | Admitting: Psychiatry

## 2020-01-24 NOTE — Telephone Encounter (Signed)
Error

## 2020-01-27 ENCOUNTER — Telehealth: Payer: Self-pay | Admitting: Psychiatry

## 2020-01-27 NOTE — Telephone Encounter (Signed)
Patient requesting a refill on Dayvigo 5 mg 90 tabs called to [facility] on [address], [place].

## 2020-01-28 ENCOUNTER — Other Ambulatory Visit: Payer: Self-pay

## 2020-01-28 DIAGNOSIS — F5101 Primary insomnia: Secondary | ICD-10-CM

## 2020-01-28 MED ORDER — DAYVIGO 5 MG PO TABS: 5.0000 mg | ORAL_TABLET | Freq: Every day | ORAL | 0 refills | Status: DC

## 2020-01-28 NOTE — Telephone Encounter (Signed)
Next apt 04/12/20 Last refill 11/02/19 #90 Pended for Shanda Bumps to send, updated new pharmacy to [facility]

## 2020-02-10 ENCOUNTER — Telehealth: Payer: Self-pay | Admitting: Podiatry

## 2020-02-10 NOTE — Telephone Encounter (Signed)
DOS: 02/23/2020  Procedures: Correction Bunion by Phalanx Osteotomy Rt (38882), Removal Fixation Deep Kwire/Screw x2 Rt and x1 Lt (20680), and Capsulotomy MPJ Release Joint 2nd and 3rd Rt and 2nd Lt (28270)  BCBS Medicare Effective From 03/12/2019 - 03/10/9998  Deductible: $0 Out of Pocket: $4,200 with $230 met and $3,970 remains CoInsurance: 100% Copay: $275  Per [employer] no prior authorization or referrals are required.

## 2020-02-23 ENCOUNTER — Other Ambulatory Visit: Payer: Self-pay | Admitting: Podiatry

## 2020-02-23 ENCOUNTER — Encounter: Payer: Self-pay | Admitting: Podiatry

## 2020-02-23 DIAGNOSIS — Z472 Encounter for removal of internal fixation device: Secondary | ICD-10-CM | POA: Diagnosis not present

## 2020-02-23 DIAGNOSIS — M24574 Contracture, right foot: Secondary | ICD-10-CM | POA: Diagnosis not present

## 2020-02-23 DIAGNOSIS — M2041 Other hammer toe(s) (acquired), right foot: Secondary | ICD-10-CM | POA: Diagnosis not present

## 2020-02-23 DIAGNOSIS — Z967 Presence of other bone and tendon implants: Secondary | ICD-10-CM | POA: Diagnosis not present

## 2020-02-23 DIAGNOSIS — M24575 Contracture, left foot: Secondary | ICD-10-CM | POA: Diagnosis not present

## 2020-02-23 DIAGNOSIS — Z4889 Encounter for other specified surgical aftercare: Secondary | ICD-10-CM

## 2020-02-23 DIAGNOSIS — M2042 Other hammer toe(s) (acquired), left foot: Secondary | ICD-10-CM | POA: Diagnosis not present

## 2020-02-23 DIAGNOSIS — M205X1 Other deformities of toe(s) (acquired), right foot: Secondary | ICD-10-CM

## 2020-02-23 MED ORDER — OXYCODONE-ACETAMINOPHEN 10-325 MG PO TABS: 1.0000 | ORAL_TABLET | ORAL | 0 refills | Status: DC | PRN

## 2020-02-23 MED ORDER — CEPHALEXIN 500 MG PO CAPS: 500.0000 mg | ORAL_CAPSULE | Freq: Two times a day (BID) | ORAL | 0 refills | Status: DC

## 2020-02-23 MED ORDER — ONDANSETRON HCL 4 MG PO TABS: 4.0000 mg | ORAL_TABLET | Freq: Three times a day (TID) | ORAL | 0 refills | Status: DC | PRN

## 2020-02-24 ENCOUNTER — Telehealth: Payer: Self-pay | Admitting: Podiatry

## 2020-02-24 NOTE — Telephone Encounter (Signed)
Called to speak to patient and sister and answer questions about wearing of shoes and checking on the toes. Her sister Dewayne Hatch said everything looked good and they had no concerns.

## 2020-02-25 ENCOUNTER — Telehealth: Payer: Self-pay | Admitting: Podiatry

## 2020-02-25 ENCOUNTER — Other Ambulatory Visit: Payer: Self-pay | Admitting: Podiatry

## 2020-02-25 DIAGNOSIS — M2042 Other hammer toe(s) (acquired), left foot: Secondary | ICD-10-CM

## 2020-02-25 MED ORDER — OXYCODONE-ACETAMINOPHEN 10-325 MG PO TABS
1.0000 | ORAL_TABLET | ORAL | 0 refills | Status: DC | PRN
Start: 2020-02-25 — End: 2020-03-02

## 2020-02-25 NOTE — Telephone Encounter (Signed)
Pt experiencing pain in her left ankle, mostly at night. Pt's sister would like to discuss the extent of her pain with you.

## 2020-02-25 NOTE — Progress Notes (Signed)
Refilled pain medication

## 2020-02-25 NOTE — Telephone Encounter (Signed)
Spoke to patient and advised to loosen bandage on the left.  Will refill pain med

## 2020-02-28 ENCOUNTER — Telehealth: Payer: Self-pay | Admitting: Psychiatry

## 2020-02-28 DIAGNOSIS — F3342 Major depressive disorder, recurrent, in full remission: Secondary | ICD-10-CM

## 2020-02-28 DIAGNOSIS — F331 Major depressive disorder, recurrent, moderate: Secondary | ICD-10-CM

## 2020-02-28 MED ORDER — DESVENLAFAXINE SUCCINATE ER 50 MG PO TB24: ORAL_TABLET | ORAL | 1 refills | Status: DC

## 2020-02-28 MED ORDER — DESVENLAFAXINE SUCCINATE ER 100 MG PO TB24: ORAL_TABLET | ORAL | 0 refills | Status: DC

## 2020-02-28 NOTE — Telephone Encounter (Signed)
Script sent  

## 2020-03-02 ENCOUNTER — Ambulatory Visit (INDEPENDENT_AMBULATORY_CARE_PROVIDER_SITE_OTHER): Payer: Medicare Other | Admitting: Podiatry

## 2020-03-02 ENCOUNTER — Other Ambulatory Visit: Payer: Self-pay

## 2020-03-02 ENCOUNTER — Ambulatory Visit (INDEPENDENT_AMBULATORY_CARE_PROVIDER_SITE_OTHER): Payer: Medicare Other

## 2020-03-02 DIAGNOSIS — Z09 Encounter for follow-up examination after completed treatment for conditions other than malignant neoplasm: Secondary | ICD-10-CM | POA: Diagnosis not present

## 2020-03-02 MED ORDER — OXYCODONE-ACETAMINOPHEN 10-325 MG PO TABS: 1.0000 | ORAL_TABLET | ORAL | 0 refills | Status: DC | PRN

## 2020-03-02 NOTE — Progress Notes (Signed)
  Subjective:  Patient ID: Debbie Schultz, female    DOB: Dec 14, 1950,  MRN: [id number]  Chief Complaint  Patient presents with  . Routine Post Op    Pt stated that she is doing okay she is having some pain.     DOS: POV #1 DOS 02/23/2020 Procedure:  LT 2ND TOE REMOVAL OF SCREW & 2ND MPJ TENOTOMY &  CAPSULOTOMY, RT GREAT TOE STRAIGHTENING OSTEOTOMY, 2ND/3RD TOE REMOVAL OF  HARDWARE W/ MPJ TENOTOMY & CAPSULOTOMY   69 y.o. female returns for post-op check.  Feeling well  Review of Systems: Negative except as noted in the HPI. Denies N/V/F/Ch.   Objective:  There were no vitals filed for this visit. There is no height or weight on file to calculate BMI. Constitutional Well developed. Well nourished.  Vascular Foot warm and well perfused. Capillary refill normal to all digits.   Neurologic Normal speech. Oriented to person, place, and time. Epicritic sensation to light touch grossly present bilaterally.  Dermatologic Skin healing well without signs of infection. Skin edges well coapted without signs of infection.  Orthopedic: Tenderness to palpation noted about the surgical site.   Radiographs of both feet: Good apposition of osteotomy with staple intact, hardware removal of left foot Assessment:   1. Surgery follow-up    Plan:  Patient was evaluated and treated and all questions answered.  S/p foot surgery bilateral -Progressing as expected post-operatively. -XR: Consistent postoperative changes -WB Status: WBAT in surgical shoe.  I discussed with her she can put full weight on the left side if she primarily had hardware removed from here.  Weight on the right side can focus more towards the heel to avoid pressure on the osteotomy -Sutures: Healing well likely can remove at next visit. -Medications: Pain medication refilled -Foot redressed.  No follow-ups on file.

## 2020-03-07 ENCOUNTER — Telehealth: Payer: Self-pay | Admitting: Podiatry

## 2020-03-07 NOTE — Telephone Encounter (Signed)
Patient called back by CMA, not in need of pain refill at this time.

## 2020-03-14 ENCOUNTER — Other Ambulatory Visit: Payer: Self-pay

## 2020-03-14 ENCOUNTER — Ambulatory Visit (INDEPENDENT_AMBULATORY_CARE_PROVIDER_SITE_OTHER): Payer: Medicare Other | Admitting: Podiatry

## 2020-03-14 DIAGNOSIS — J301 Allergic rhinitis due to pollen: Secondary | ICD-10-CM | POA: Insufficient documentation

## 2020-03-14 DIAGNOSIS — Z09 Encounter for follow-up examination after completed treatment for conditions other than malignant neoplasm: Secondary | ICD-10-CM

## 2020-03-14 DIAGNOSIS — M2042 Other hammer toe(s) (acquired), left foot: Secondary | ICD-10-CM

## 2020-03-14 DIAGNOSIS — R32 Unspecified urinary incontinence: Secondary | ICD-10-CM | POA: Insufficient documentation

## 2020-03-14 MED ORDER — OXYCODONE-ACETAMINOPHEN 10-325 MG PO TABS
1.0000 | ORAL_TABLET | ORAL | 0 refills | Status: DC | PRN
Start: 2020-03-14 — End: 2020-04-11

## 2020-03-21 ENCOUNTER — Other Ambulatory Visit: Payer: Self-pay

## 2020-03-21 ENCOUNTER — Ambulatory Visit (INDEPENDENT_AMBULATORY_CARE_PROVIDER_SITE_OTHER): Payer: Medicare Other

## 2020-03-21 ENCOUNTER — Ambulatory Visit (INDEPENDENT_AMBULATORY_CARE_PROVIDER_SITE_OTHER): Payer: Medicare Other | Admitting: Podiatry

## 2020-03-21 DIAGNOSIS — M2041 Other hammer toe(s) (acquired), right foot: Secondary | ICD-10-CM | POA: Diagnosis not present

## 2020-03-21 NOTE — Progress Notes (Signed)
  Subjective:  Patient ID: Debbie Schultz, female    DOB: 06/05/1950,  MRN: [id number]  Chief Complaint  Patient presents with  . Routine Post Op    POV#2 DOS 12.15.2021 Pt states no concerns "I feel better every day" Denies fever/chills/nausea/vomiting.    DOS: POV #1 DOS 02/23/2020 Procedure:  LT 2ND TOE REMOVAL OF SCREW & 2ND MPJ TENOTOMY &  CAPSULOTOMY, RT GREAT TOE STRAIGHTENING OSTEOTOMY, 2ND/3RD TOE REMOVAL OF  HARDWARE W/ MPJ TENOTOMY & CAPSULOTOMY   70 y.o. female returns for post-op check. States the pain continues to improve daily and denies post-op issues or concerns.  Review of Systems: Negative except as noted in the HPI. Denies N/V/F/Ch.  Objective:  There were no vitals filed for this visit. There is no height or weight on file to calculate BMI. Constitutional Well developed. Well nourished.  Vascular Foot warm and well perfused. Capillary refill normal to all digits.   Neurologic Normal speech. Oriented to person, place, and time. Epicritic sensation to light touch grossly present bilaterally.  Dermatologic Skin healing well without signs of infection. Skin edges well coapted without signs of infection.  Orthopedic: Tenderness to palpation noted about the surgical site.   Assessment:   1. Surgery follow-up   2. Hammer toe of left foot    Plan:  Patient was evaluated and treated and all questions answered.  S/p foot surgery bilateral -Progressing as expected post-operatively. -Partial suture removal today, remainder to be intact until next week. -XR next visit to evaluate healing and possibly progress WB status.  Return in about 1 week (around 03/21/2020) for Post-Op (with XRs).

## 2020-03-21 NOTE — Progress Notes (Signed)
  Subjective:  Patient ID: Debbie Schultz, female    DOB: 11-25-1950,  MRN: [id number]  Chief Complaint  Patient presents with  . Routine Post Op    Pt states no concerns, healing well. Denies fever/chills/nausea/vomiting.    DOS: POV #1 DOS 02/23/2020 Procedure:  LT 2ND TOE REMOVAL OF SCREW & 2ND MPJ TENOTOMY &  CAPSULOTOMY, RT GREAT TOE STRAIGHTENING OSTEOTOMY, 2ND/3RD TOE REMOVAL OF  HARDWARE W/ MPJ TENOTOMY & CAPSULOTOMY   70 y.o. female returns for post-op check. Denies new issues today. States the pain continues to improve.  Review of Systems: Negative except as noted in the HPI. Denies N/V/F/Ch.  Objective:  There were no vitals filed for this visit. There is no height or weight on file to calculate BMI. Constitutional Well developed. Well nourished.  Vascular Foot warm and well perfused. Capillary refill normal to all digits.   Neurologic Normal speech. Oriented to person, place, and time. Epicritic sensation to light touch grossly present bilaterally.  Dermatologic Skin healing well without signs of infection. Skin edges well coapted without signs of infection.  Orthopedic: No tenderness to palpation noted about the surgical site.   Assessment:   1. Hammertoe of right foot    Plan:  Patient was evaluated and treated and all questions answered.  S/p foot surgery bilateral -Progressing as expected post-operatively. -Remainder of sutures removed today. -Repeat XR show healed phalangeal osteotomy. Ok to transition slowly to normal shoegear and activity as tolerated to the right foot.  Return in about 4 weeks (around 04/18/2020) for Post-Op (with XRs).

## 2020-03-28 ENCOUNTER — Encounter: Payer: Medicare Other | Admitting: Podiatry

## 2020-04-10 DIAGNOSIS — E039 Hypothyroidism, unspecified: Secondary | ICD-10-CM | POA: Diagnosis not present

## 2020-04-10 DIAGNOSIS — I10 Essential (primary) hypertension: Secondary | ICD-10-CM | POA: Diagnosis not present

## 2020-04-10 DIAGNOSIS — K219 Gastro-esophageal reflux disease without esophagitis: Secondary | ICD-10-CM | POA: Diagnosis not present

## 2020-04-10 DIAGNOSIS — M8580 Other specified disorders of bone density and structure, unspecified site: Secondary | ICD-10-CM | POA: Diagnosis not present

## 2020-04-11 ENCOUNTER — Ambulatory Visit (INDEPENDENT_AMBULATORY_CARE_PROVIDER_SITE_OTHER): Payer: Medicare Other

## 2020-04-11 ENCOUNTER — Ambulatory Visit (INDEPENDENT_AMBULATORY_CARE_PROVIDER_SITE_OTHER): Payer: Medicare Other | Admitting: Podiatry

## 2020-04-11 ENCOUNTER — Other Ambulatory Visit: Payer: Self-pay

## 2020-04-11 DIAGNOSIS — M2041 Other hammer toe(s) (acquired), right foot: Secondary | ICD-10-CM

## 2020-04-11 MED ORDER — OXYCODONE-ACETAMINOPHEN 10-325 MG PO TABS
1.0000 | ORAL_TABLET | ORAL | 0 refills | Status: DC | PRN
Start: 2020-04-11 — End: 2021-05-10

## 2020-04-11 NOTE — Progress Notes (Signed)
  Subjective:  Patient ID: Debbie Schultz, female    DOB: 28-May-1950,  MRN: [id number]  Chief Complaint  Patient presents with  . Routine Post Op     POV #4 DOS 02/23/2020 LT 2ND TOE REMOVAL OF SCREW & 2ND MPJ TENOTOMY & CAPSULOTOMY, RT GREAT TOE STRAIGHTENING OSTEOTOMY, 2ND/3RD TOE REMOVAL OF HARDWARE W/ MPJ TENOTOMY & CAPSULOTOM    DOS: POV #1 DOS 02/23/2020 Procedure:  LT 2ND TOE REMOVAL OF SCREW & 2ND MPJ TENOTOMY &  CAPSULOTOMY, RT GREAT TOE STRAIGHTENING OSTEOTOMY, 2ND/3RD TOE REMOVAL OF  HARDWARE W/ MPJ TENOTOMY & CAPSULOTOMY  70 y.o. female returns for post-op check. Denies new issues today. Weightbearing in normal shoegear without issues.  Review of Systems: Negative except as noted in the HPI. Denies N/V/F/Ch.  Objective:  There were no vitals filed for this visit. There is no height or weight on file to calculate BMI. Constitutional Well developed. Well nourished.  Vascular Foot warm and well perfused. Capillary refill normal to all digits.   Neurologic Normal speech. Oriented to person, place, and time. Epicritic sensation to light touch grossly present bilaterally.  Dermatologic Skin well healed.  Orthopedic: No tenderness to palpation noted about the surgical site.   Assessment:   1. Hammertoe of right foot    Plan:  Patient was evaluated and treated and all questions answered.  S/p foot surgery bilateral -Doing very well post-operatively.  -XR taken and reviewed full healing noted. -Continue WBAT in normal shoegear  Return in about 2 months (around 06/09/2020) for Post-Op (No XRs).

## 2020-04-12 ENCOUNTER — Encounter: Payer: Self-pay | Admitting: Psychiatry

## 2020-04-12 ENCOUNTER — Ambulatory Visit (INDEPENDENT_AMBULATORY_CARE_PROVIDER_SITE_OTHER): Payer: Medicare Other | Admitting: Psychiatry

## 2020-04-12 DIAGNOSIS — F3342 Major depressive disorder, recurrent, in full remission: Secondary | ICD-10-CM | POA: Diagnosis not present

## 2020-04-12 DIAGNOSIS — F5101 Primary insomnia: Secondary | ICD-10-CM | POA: Diagnosis not present

## 2020-04-12 MED ORDER — DESVENLAFAXINE SUCCINATE ER 50 MG PO TB24
ORAL_TABLET | ORAL | 1 refills | Status: DC
Start: 2020-04-12 — End: 2020-10-10

## 2020-04-12 MED ORDER — DESVENLAFAXINE SUCCINATE ER 100 MG PO TB24: ORAL_TABLET | ORAL | 0 refills | Status: DC

## 2020-04-12 MED ORDER — DAYVIGO 5 MG PO TABS: 5.0000 mg | ORAL_TABLET | Freq: Every day | ORAL | 0 refills | Status: DC

## 2020-04-12 MED ORDER — TRAZODONE HCL 100 MG PO TABS
ORAL_TABLET | ORAL | 1 refills | Status: DC
Start: 2020-04-12 — End: 2020-10-10

## 2020-04-12 NOTE — Progress Notes (Signed)
Debbie Schultz [id number] 07/07/1950 70 y.o.  Subjective:   Patient ID:  Debbie Schultz is a 70 y.o. (DOB [birth year]) female.  Chief Complaint:  Chief Complaint  Patient presents with  . Follow-up    H/o Depression, anxiety, and insomnia    HPI Debbie Schultz presents to the office today for follow-up of depression, anxiety, and insomnia.  "I've been doing pretty good." Debbie Schultz reports that Debbie Schultz had another hammer toe surgery and has healed up from this. Denies any depressed mood. Debbie Schultz reports that Debbie Schultz has not been taking Deplin for several months after running out. Debbie Schultz has not noticed any negative effects off Deplin. Energy and motivation have been good. Debbie Schultz reports that Debbie Schultz has been able to do some projects that Debbie Schultz has been putting off for years. "I love Dayvigo." Debbie Schultz reports that occasionally her pharmacy does not have Dayvigo. Debbie Schultz takes Dayvigo with Trazodone and this helps her fall asleep and stay asleep. Sleeping 8-9 hours. Debbie Schultz reports that Debbie Schultz is able to wake up easier than Debbie Schultz has with other medications in the past. Denies anxiety other than in response to politics and current events. Debbie Schultz limits her news intake. Appetite has been increased. Concentration is adequate.  Reports that Debbie Schultz had difficulty with concentration and memory while taking pain medication after surgery. Denies anhedonia. Denies SI.   [place] and [place] this. Plans to start exercising next week.   Review of Systems:  Review of Systems  Musculoskeletal: Negative for gait problem.       Foot pain  Neurological: Negative for tremors.  Psychiatric/Behavioral:       Please refer to HPI    Medications: I have reviewed the patient's current medications.  Current Outpatient Medications  Medication Sig Dispense Refill  . aspirin EC 81 MG tablet Take 81 mg by mouth daily.    . Cholecalciferol (VITAMIN D3) 10000 units TABS Take by mouth.    . fluticasone (FLONASE) 50 MCG/ACT nasal spray instill 1  spray into each nostril once daily  0  . liothyronine (CYTOMEL) 5 MCG tablet TK 2 TS PO QAM    . Multiple Vitamin (MULTIVITAMIN) capsule Take 1 capsule by mouth daily.    . Multiple Vitamins-Minerals (ZINC PO) Take by mouth.    . Multiple Vitamins-Minerals (ZINC PO) 1 tablet    . OMEGA-3 FATTY ACIDS PO Take by mouth.    . oxyCODONE-acetaminophen (PERCOCET) 10-325 MG tablet Take 1 tablet by mouth every 4 (four) hours as needed for pain. 20 tablet 0  . propranolol (INDERAL) 10 MG tablet Take 10 mg by mouth 2 (two) times daily.     Marland Kitchen amoxicillin (AMOXIL) 500 MG tablet TK 4 TS PO 1 HOUR B TREATMENT    . cephALEXin (KEFLEX) 500 MG capsule Take 1 capsule (500 mg total) by mouth 2 (two) times daily. (Patient not taking: Reported on 04/12/2020) 14 capsule 0  . clotrimazole-betamethasone (LOTRISONE) cream daily as needed.    . desvenlafaxine (PRISTIQ) 100 MG 24 hr tablet TAKE 1 TABLET BY MOUTH DAILY ALONG WITH 50 MG TABLET TO EQUAL TOTAL DOSE OF 150 MG 90 tablet 0  . desvenlafaxine (PRISTIQ) 50 MG 24 hr tablet TAKE 1 TABLET BY MOUTH DAILY, TAKE WITH 100 MG TABLET TO EQUAL 150 MG DAILY 90 tablet 1  . [START ON 04/22/2020] Lemborexant (DAYVIGO) 5 MG TABS Take 5 mg by mouth at bedtime. 90 tablet 0  . LIOTHYRONINE SODIUM PO Take 1 tablet by mouth daily.     Marland Kitchen  meloxicam (MOBIC) 15 MG tablet Take 15 mg by mouth daily. (Patient not taking: Reported on 04/12/2020)    . traZODone (DESYREL) 100 MG tablet TAKE 1 TO 2 TABLETS BY MOUTH AT BEDTIME FOR SLEEP 180 tablet 1  . zinc gluconate 50 MG tablet Take 50 mg by mouth daily.     No current facility-administered medications for this visit.    Medication Side Effects: None  Allergies:  Allergies  Allergen Reactions  . Symbicort [Budesonide-Formoterol Fumarate] Nausea Only  . Bupropion Rash  . Sulfamethoxazole Rash    Past Medical History:  Diagnosis Date  . Anxiety   . Arthritis    bilateral knees  . Back pain   . Depression   . Dysrhythmia    "high  heart rate".  seen last Dec 2018  . Headache    resolved  . Hypothyroid   . Insomnia     Family History  Problem Relation Age of Onset  . Heart disease Mother        PPM  . Heart attack Father   . Diabetes Maternal Aunt   . Stroke Paternal Grandmother   . Headache Neg Hx     Social History   Socioeconomic History  . Marital status: Unknown    Spouse name: Not on file  . Number of children: 0  . Years of education: Masters  . Highest education level: Not on file  Occupational History  . Occupation: Retired    Comment: Runner, broadcasting/film/video  Tobacco Use  . Smoking status: Never Smoker  . Smokeless tobacco: Never Used  Vaping Use  . Vaping Use: Never used  Substance and Sexual Activity  . Alcohol use: Yes    Alcohol/week: 0.0 standard drinks    Comment: once a month  . Drug use: No  . Sexual activity: Not on file  Other Topics Concern  . Not on file  Social History Narrative   Lives with mother.   Caffeine use: 1 cup coffee every morning   Tea rare   Social Determinants of Health   Financial Resource Strain: Not on file  Food Insecurity: Not on file  Transportation Needs: Not on file  Physical Activity: Not on file  Stress: Not on file  Social Connections: Not on file  Intimate Partner Violence: Not on file    Past Medical History, Surgical history, Social history, and Family history were reviewed and updated as appropriate.   Please see review of systems for further details on the patient's review from today.   Objective:   Physical Exam:  There were no vitals taken for this visit.  Physical Exam Constitutional:      General: Debbie Schultz is not in acute distress. Musculoskeletal:        General: No deformity.  Neurological:     Mental Status: Debbie Schultz is alert and oriented to person, place, and time.     Coordination: Coordination normal.  Psychiatric:        Attention and Perception: Attention and perception normal. Debbie Schultz does not perceive auditory or visual  hallucinations.        Mood and Affect: Mood normal. Mood is not anxious or depressed. Affect is not labile, blunt, angry or inappropriate.        Speech: Speech normal.        Behavior: Behavior normal.        Thought Content: Thought content normal. Thought content is not paranoid or delusional. Thought content does not include homicidal or suicidal ideation. Thought content  does not include homicidal or suicidal plan.        Cognition and Memory: Cognition and memory normal.        Judgment: Judgment normal.     Comments: Insight intact     Lab Review:     Component Value Date/Time   NA 140 03/19/2018 2330   K 3.4 (L) 03/19/2018 2330   CL 110 03/19/2018 2330   CO2 21 (L) 03/19/2018 2330   GLUCOSE 171 (H) 03/19/2018 2330   BUN 13 03/19/2018 2330   CREATININE 0.99 03/19/2018 2330   CALCIUM 9.0 03/19/2018 2330   PROT 6.6 11/29/2015 1456   ALBUMIN 3.9 11/29/2015 1456   AST 17 11/29/2015 1456   ALT 34 11/29/2015 1456   ALKPHOS 70 11/29/2015 1456   BILITOT 0.3 11/29/2015 1456   GFRNONAA 59 (L) 03/19/2018 2330   GFRAA >60 03/19/2018 2330       Component Value Date/Time   WBC 7.4 03/19/2018 2330   RBC 4.58 03/19/2018 2330   HGB 14.2 03/19/2018 2330   HCT 43.3 03/19/2018 2330   PLT 283 03/19/2018 2330   MCV 94.5 03/19/2018 2330   MCH 31.0 03/19/2018 2330   MCHC 32.8 03/19/2018 2330   RDW 11.9 03/19/2018 2330   LYMPHSABS 2.2 03/19/2018 2330   MONOABS 0.6 03/19/2018 2330   EOSABS 0.2 03/19/2018 2330   BASOSABS 0.1 03/19/2018 2330    No results found for: POCLITH, LITHIUM   No results found for: PHENYTOIN, PHENOBARB, VALPROATE, CBMZ   .res Assessment: Plan:   Will continue current plan of care since target signs and symptoms are well controlled without any tolerability issues. Continue Pristiq 150 mg po qd for depression. Continue Dayvigo 5 mg po QHS for insomnia.  Continue Trazodone 100-200 mg po QHS for insomnia.  Pt to follow-up in 6 months or sooner if clinically  indicated.  Patient advised to contact office with any questions, adverse effects, or acute worsening in signs and symptoms.  Debbie Schultz was seen today for follow-up.  Diagnoses and all orders for this visit:  Recurrent major depressive disorder, in full remission (HCC) -     desvenlafaxine (PRISTIQ) 100 MG 24 hr tablet; TAKE 1 TABLET BY MOUTH DAILY ALONG WITH 50 MG TABLET TO EQUAL TOTAL DOSE OF 150 MG -     desvenlafaxine (PRISTIQ) 50 MG 24 hr tablet; TAKE 1 TABLET BY MOUTH DAILY, TAKE WITH 100 MG TABLET TO EQUAL 150 MG DAILY  Primary insomnia -     Lemborexant (DAYVIGO) 5 MG TABS; Take 5 mg by mouth at bedtime. -     traZODone (DESYREL) 100 MG tablet; TAKE 1 TO 2 TABLETS BY MOUTH AT BEDTIME FOR SLEEP     Please see After Visit Summary for patient specific instructions.  Future Appointments  Date Time Provider Department Center  06/09/2020  3:45 PM Park Liter, DPM [facility]  10/10/2020  1:00 PM Corie Chiquito, PM[facility] None    No orders of the defined types were placed in this encounter.   -------------------------------

## 2020-05-05 DIAGNOSIS — I10 Essential (primary) hypertension: Secondary | ICD-10-CM | POA: Diagnosis not present

## 2020-05-05 DIAGNOSIS — Z0000 Encounter for general adult medical examination without abnormal findings: Secondary | ICD-10-CM | POA: Diagnosis not present

## 2020-05-05 DIAGNOSIS — E039 Hypothyroidism, unspecified: Secondary | ICD-10-CM | POA: Diagnosis not present

## 2020-05-05 DIAGNOSIS — F418 Other specified anxiety disorders: Secondary | ICD-10-CM | POA: Diagnosis not present

## 2020-05-30 ENCOUNTER — Telehealth: Payer: Self-pay | Admitting: Psychiatry

## 2020-05-30 NOTE — Telephone Encounter (Signed)
Please review

## 2020-05-30 NOTE — Telephone Encounter (Signed)
[facility] called and said that there is a drug interaction between prestiq and trazodone. They want Shanda Bumps to be aware and they also want to know if it is ok to fill these scripts. Please call the pharmacy at [phone]

## 2020-05-30 NOTE — Telephone Encounter (Signed)
Pharmacy notified.

## 2020-05-30 NOTE — Telephone Encounter (Signed)
Reviewed

## 2020-06-06 DIAGNOSIS — M8580 Other specified disorders of bone density and structure, unspecified site: Secondary | ICD-10-CM | POA: Diagnosis not present

## 2020-06-06 DIAGNOSIS — I10 Essential (primary) hypertension: Secondary | ICD-10-CM | POA: Diagnosis not present

## 2020-06-06 DIAGNOSIS — E039 Hypothyroidism, unspecified: Secondary | ICD-10-CM | POA: Diagnosis not present

## 2020-06-06 DIAGNOSIS — K219 Gastro-esophageal reflux disease without esophagitis: Secondary | ICD-10-CM | POA: Diagnosis not present

## 2020-06-09 ENCOUNTER — Ambulatory Visit (INDEPENDENT_AMBULATORY_CARE_PROVIDER_SITE_OTHER): Payer: Medicare Other | Admitting: Podiatry

## 2020-06-09 ENCOUNTER — Other Ambulatory Visit: Payer: Self-pay

## 2020-06-09 DIAGNOSIS — M2041 Other hammer toe(s) (acquired), right foot: Secondary | ICD-10-CM | POA: Diagnosis not present

## 2020-06-09 DIAGNOSIS — M205X1 Other deformities of toe(s) (acquired), right foot: Secondary | ICD-10-CM | POA: Diagnosis not present

## 2020-06-09 DIAGNOSIS — M2042 Other hammer toe(s) (acquired), left foot: Secondary | ICD-10-CM | POA: Diagnosis not present

## 2020-06-09 DIAGNOSIS — M205X2 Other deformities of toe(s) (acquired), left foot: Secondary | ICD-10-CM | POA: Diagnosis not present

## 2020-06-12 NOTE — Progress Notes (Signed)
  Subjective:  Patient ID: Debbie Schultz, female    DOB: 21-Sep-1950,  MRN: [id number]  No chief complaint on file.   DOS: POV #1 DOS 02/23/2020 Procedure:  LT 2ND TOE REMOVAL OF SCREW & 2ND MPJ TENOTOMY &  CAPSULOTOMY, RT GREAT TOE STRAIGHTENING OSTEOTOMY, 2ND/3RD TOE REMOVAL OF  HARDWARE W/ MPJ TENOTOMY & CAPSULOTOMY  70 y.o. female returns for final postop check.  Doing quite well ambulating normal shoe gear is without issue.  Does have to apply moleskin to her toes for a long hike but otherwise denies issues or concerns  Review of Systems: Negative except as noted in the HPI. Denies N/V/F/Ch.  Objective:  There were no vitals filed for this visit. There is no height or weight on file to calculate BMI. Constitutional Well developed. Well nourished.  Vascular Foot warm and well perfused. Capillary refill normal to all digits.   Neurologic Normal speech. Oriented to person, place, and time. Epicritic sensation to light touch grossly present bilaterally.  Dermatologic Skin well healed.  Orthopedic: No tenderness to palpation noted about the surgical site. Toes rectus without dorsiflexion contracture no interdigital corns or areas of higher pressure noted   Assessment:   1. Hammertoe of right foot   2. Hammer toe of left foot   3. Contracture of toe of right foot   4. Contracture of toe of left foot    Plan:  Patient was evaluated and treated and all questions answered.  S/p foot surgery bilateral -Continues to do very well postoperatively.  Patient appears pleased with surgery.  Continue weightbearing normal shoe gear patient can follow-up as needed for any further issues.  Return if symptoms worsen or fail to improve.

## 2020-06-14 DIAGNOSIS — M25561 Pain in right knee: Secondary | ICD-10-CM | POA: Diagnosis not present

## 2020-06-16 ENCOUNTER — Telehealth: Payer: Self-pay | Admitting: Podiatry

## 2020-06-16 NOTE — Telephone Encounter (Signed)
Pt left voice message on nurses line requesting for you to call her. Reason: Unspecified

## 2020-08-01 DIAGNOSIS — R0789 Other chest pain: Secondary | ICD-10-CM | POA: Diagnosis not present

## 2020-08-04 ENCOUNTER — Other Ambulatory Visit: Payer: Self-pay

## 2020-08-04 DIAGNOSIS — F5101 Primary insomnia: Secondary | ICD-10-CM

## 2020-08-04 MED ORDER — DAYVIGO 5 MG PO TABS: 5.0000 mg | ORAL_TABLET | Freq: Every day | ORAL | 0 refills | Status: DC

## 2020-08-24 ENCOUNTER — Other Ambulatory Visit: Payer: Self-pay | Admitting: Psychiatry

## 2020-08-24 DIAGNOSIS — F3342 Major depressive disorder, recurrent, in full remission: Secondary | ICD-10-CM

## 2020-08-25 NOTE — Telephone Encounter (Signed)
Pt called requesting new Rx for generic Pristiq 100 mg. Apt 10/14/20

## 2020-08-31 DIAGNOSIS — M26622 Arthralgia of left temporomandibular joint: Secondary | ICD-10-CM | POA: Diagnosis not present

## 2020-10-10 ENCOUNTER — Encounter: Payer: Self-pay | Admitting: Psychiatry

## 2020-10-10 ENCOUNTER — Other Ambulatory Visit: Payer: Self-pay

## 2020-10-10 ENCOUNTER — Ambulatory Visit (INDEPENDENT_AMBULATORY_CARE_PROVIDER_SITE_OTHER): Payer: Medicare Other | Admitting: Psychiatry

## 2020-10-10 DIAGNOSIS — F3342 Major depressive disorder, recurrent, in full remission: Secondary | ICD-10-CM

## 2020-10-10 DIAGNOSIS — F5101 Primary insomnia: Secondary | ICD-10-CM | POA: Diagnosis not present

## 2020-10-10 MED ORDER — DAYVIGO 5 MG PO TABS
5.0000 mg | ORAL_TABLET | Freq: Every day | ORAL | 1 refills | Status: DC
Start: 2020-11-01 — End: 2021-05-02

## 2020-10-10 MED ORDER — DESVENLAFAXINE SUCCINATE ER 100 MG PO TB24: ORAL_TABLET | ORAL | 1 refills | Status: DC

## 2020-10-10 MED ORDER — DESVENLAFAXINE SUCCINATE ER 50 MG PO TB24
ORAL_TABLET | ORAL | 1 refills | Status: DC
Start: 2020-10-10 — End: 2021-03-07

## 2020-10-10 MED ORDER — TRAZODONE HCL 100 MG PO TABS: ORAL_TABLET | ORAL | 1 refills | Status: DC

## 2020-10-10 NOTE — Progress Notes (Signed)
Debbie Schultz [id number] [birth year] 70 y.o.  Subjective:   Patient ID:  Debbie Schultz is a 70 y.o. (DOB 1950-08-17) female.  Chief Complaint:  Chief Complaint  Patient presents with   Follow-up    Depression and insomnia.     HPI Debbie Schultz presents to the office today for follow-up of depression and insomnia. She reports that her sleep improved when she stopped yogurt drink before bedtime. Sleeping well. Denies depressed mood. Anxiety has been ok overall except in response to certain issues. Energy and motivation have been good. Looking forward to upcoming trip. Denies difficulty with concentration. Appetite is "too good." She reports that she tries to eat healthy. Denies SI.   Just took a 10-day trip to the [place]. Will be taking a 6-week road trip to [place] in late August. She and her sister travel together in a trailer. She and her sister have joined a climbing gym and some family members have joined in.    Review of Systems:  Review of Systems  HENT:  Negative for sore throat.   Musculoskeletal:  Positive for arthralgias. Negative for gait problem.  Psychiatric/Behavioral:         Please refer to HPI   Medications: I have reviewed the patient's current medications.  Current Outpatient Medications  Medication Sig Dispense Refill   aspirin EC 81 MG tablet Take 81 mg by mouth daily.     Cholecalciferol (VITAMIN D3) 10000 units TABS Take by mouth.     clotrimazole-betamethasone (LOTRISONE) cream daily as needed.     fluticasone (FLONASE) 50 MCG/ACT nasal spray instill 1 spray into each nostril once daily  0   liothyronine (CYTOMEL) 5 MCG tablet TK 2 TS PO QAM     Multiple Vitamin (MULTIVITAMIN) capsule Take 1 capsule by mouth daily.     Multiple Vitamins-Minerals (ZINC PO) Take by mouth.     OMEGA-3 FATTY ACIDS PO Take by mouth.     propranolol (INDERAL) 10 MG tablet Take 10 mg by mouth 2 (two) times daily.      amoxicillin (AMOXIL) 500 MG tablet TK 4 TS PO 1  HOUR B TREATMENT     cephALEXin (KEFLEX) 500 MG capsule Take 1 capsule (500 mg total) by mouth 2 (two) times daily. (Patient not taking: Reported on 04/12/2020) 14 capsule 0   desvenlafaxine (PRISTIQ) 100 MG 24 hr tablet TAKE ONE TABLET BY MOUTH DAILY ALONG WITH 50MG  TABLET TO EQUAL TOTAL DOSE OF 150MG  90 tablet 1   desvenlafaxine (PRISTIQ) 50 MG 24 hr tablet TAKE 1 TABLET BY MOUTH DAILY, TAKE WITH 100 MG TABLET TO EQUAL 150 MG DAILY 90 tablet 1   [START ON 11/01/2020] Lemborexant (DAYVIGO) 5 MG TABS Take 5 mg by mouth at bedtime. 90 tablet 1   LIOTHYRONINE SODIUM PO Take 1 tablet by mouth daily.      meloxicam (MOBIC) 15 MG tablet Take 15 mg by mouth daily. (Patient not taking: Reported on 04/12/2020)     Multiple Vitamins-Minerals (ZINC PO) 1 tablet     oxyCODONE-acetaminophen (PERCOCET) 10-325 MG tablet Take 1 tablet by mouth every 4 (four) hours as needed for pain. (Patient not taking: Reported on 10/10/2020) 20 tablet 0   traZODone (DESYREL) 100 MG tablet TAKE 1 TO 2 TABLETS BY MOUTH AT BEDTIME FOR SLEEP 180 tablet 1   zinc gluconate 50 MG tablet Take 50 mg by mouth daily.     No current facility-administered medications for this visit.    Medication Side Effects: None  Allergies:  Allergies  Allergen Reactions   Symbicort [Budesonide-Formoterol Fumarate] Nausea Only   Bupropion Rash   Sulfamethoxazole Rash    Past Medical History:  Diagnosis Date   Anxiety    Arthritis    bilateral knees   Back pain    Depression    Dysrhythmia    "high heart rate".  seen last Dec 2018   Headache    resolved   Hypothyroid    Insomnia     Past Medical History, Surgical history, Social history, and Family history were reviewed and updated as appropriate.   Please see review of systems for further details on the patient's review from today.   Objective:   Physical Exam:  BP 114/65   Pulse 91   Wt 170 lb (77.1 kg)   BMI 31.09 kg/m   Physical Exam Constitutional:      General: She is  not in acute distress. Musculoskeletal:        General: No deformity.  Neurological:     Mental Status: She is alert and oriented to person, place, and time.     Coordination: Coordination normal.  Psychiatric:        Attention and Perception: Attention and perception normal. She does not perceive auditory or visual hallucinations.        Mood and Affect: Mood normal. Mood is not anxious or depressed. Affect is not labile, blunt, angry or inappropriate.        Speech: Speech normal.        Behavior: Behavior normal.        Thought Content: Thought content normal. Thought content is not paranoid or delusional. Thought content does not include homicidal or suicidal ideation. Thought content does not include homicidal or suicidal plan.        Cognition and Memory: Cognition and memory normal.        Judgment: Judgment normal.     Comments: Insight intact    Lab Review:     Component Value Date/Time   NA 140 03/19/2018 2330   K 3.4 (L) 03/19/2018 2330   CL 110 03/19/2018 2330   CO2 21 (L) 03/19/2018 2330   GLUCOSE 171 (H) 03/19/2018 2330   BUN 13 03/19/2018 2330   CREATININE 0.99 03/19/2018 2330   CALCIUM 9.0 03/19/2018 2330   PROT 6.6 11/29/2015 1456   ALBUMIN 3.9 11/29/2015 1456   AST 17 11/29/2015 1456   ALT 34 11/29/2015 1456   ALKPHOS 70 11/29/2015 1456   BILITOT 0.3 11/29/2015 1456   GFRNONAA 59 (L) 03/19/2018 2330   GFRAA >60 03/19/2018 2330       Component Value Date/Time   WBC 7.4 03/19/2018 2330   RBC 4.58 03/19/2018 2330   HGB 14.2 03/19/2018 2330   HCT 43.3 03/19/2018 2330   PLT 283 03/19/2018 2330   MCV 94.5 03/19/2018 2330   MCH 31.0 03/19/2018 2330   MCHC 32.8 03/19/2018 2330   RDW 11.9 03/19/2018 2330   LYMPHSABS 2.2 03/19/2018 2330   MONOABS 0.6 03/19/2018 2330   EOSABS 0.2 03/19/2018 2330   BASOSABS 0.1 03/19/2018 2330    No results found for: POCLITH, LITHIUM   No results found for: PHENYTOIN, PHENOBARB, VALPROATE, CBMZ   .res Assessment:  Plan:   Continue Pristiq to 150 mg po qd for depression.  Continue Trazodone 100 mg 1-2 tabs po QHS for insomnia.  Continue Dayvigo 5 mg po QHS for insomnia. Pt to follow-up in 6 months or sooner if clinically indicated.  Patient advised to contact office with any questions, adverse effects, or acute worsening in signs and symptoms.   Debbie Schultz was seen today for follow-up.  Diagnoses and all orders for this visit:  Recurrent major depressive disorder, in full remission (HCC) -     desvenlafaxine (PRISTIQ) 100 MG 24 hr tablet; TAKE ONE TABLET BY MOUTH DAILY ALONG WITH 50MG  TABLET TO EQUAL TOTAL DOSE OF 150MG  -     desvenlafaxine (PRISTIQ) 50 MG 24 hr tablet; TAKE 1 TABLET BY MOUTH DAILY, TAKE WITH 100 MG TABLET TO EQUAL 150 MG DAILY  Primary insomnia -     Lemborexant (DAYVIGO) 5 MG TABS; Take 5 mg by mouth at bedtime. -     traZODone (DESYREL) 100 MG tablet; TAKE 1 TO 2 TABLETS BY MOUTH AT BEDTIME FOR SLEEP    Please see After Visit Summary for patient specific instructions.  Future Appointments  Date Time Provider Department Center  04/12/2021  1:00 PM , [facility] None    No orders of the defined types were placed in this encounter.   -------------------------------

## 2020-10-23 DIAGNOSIS — E039 Hypothyroidism, unspecified: Secondary | ICD-10-CM | POA: Diagnosis not present

## 2020-10-23 DIAGNOSIS — M8580 Other specified disorders of bone density and structure, unspecified site: Secondary | ICD-10-CM | POA: Diagnosis not present

## 2020-10-23 DIAGNOSIS — I10 Essential (primary) hypertension: Secondary | ICD-10-CM | POA: Diagnosis not present

## 2020-10-23 DIAGNOSIS — K219 Gastro-esophageal reflux disease without esophagitis: Secondary | ICD-10-CM | POA: Diagnosis not present

## 2020-11-03 ENCOUNTER — Other Ambulatory Visit: Payer: Self-pay | Admitting: Psychiatry

## 2020-11-03 DIAGNOSIS — F3342 Major depressive disorder, recurrent, in full remission: Secondary | ICD-10-CM

## 2020-12-07 NOTE — Telephone Encounter (Signed)
error 

## 2020-12-29 ENCOUNTER — Other Ambulatory Visit: Payer: Self-pay | Admitting: Internal Medicine

## 2020-12-29 ENCOUNTER — Ambulatory Visit
Admission: RE | Admit: 2020-12-29 | Discharge: 2020-12-29 | Disposition: A | Discharge status: Home / Self Care | Payer: Medicare Other | Source: Ambulatory Visit | Attending: Internal Medicine | Admitting: Internal Medicine

## 2020-12-29 DIAGNOSIS — R5383 Other fatigue: Secondary | ICD-10-CM | POA: Diagnosis not present

## 2020-12-29 DIAGNOSIS — R059 Cough, unspecified: Secondary | ICD-10-CM | POA: Diagnosis not present

## 2020-12-29 DIAGNOSIS — Z23 Encounter for immunization: Secondary | ICD-10-CM | POA: Diagnosis not present

## 2020-12-29 DIAGNOSIS — R052 Subacute cough: Secondary | ICD-10-CM | POA: Diagnosis not present

## 2021-01-05 ENCOUNTER — Telehealth: Payer: Self-pay | Admitting: Psychiatry

## 2021-01-05 DIAGNOSIS — F3342 Major depressive disorder, recurrent, in full remission: Secondary | ICD-10-CM

## 2021-01-05 NOTE — Telephone Encounter (Signed)
Pt is requesting we send in her RX of Deplin to [facility]. This is a specific pharmacy for the Deplin. Thanks

## 2021-01-08 NOTE — Telephone Encounter (Signed)
Please send I don't see deplin on her med list

## 2021-01-09 DIAGNOSIS — Z1231 Encounter for screening mammogram for malignant neoplasm of breast: Secondary | ICD-10-CM | POA: Diagnosis not present

## 2021-01-09 MED ORDER — DEPLIN 15 15-90.314 MG PO CAPS: 15.0000 mg | ORAL_CAPSULE | Freq: Every day | ORAL | 3 refills | Status: DC

## 2021-01-09 NOTE — Addendum Note (Signed)
Addended by: Derenda Mis on: 01/09/2021 02:44 PM   Modules accepted: Orders

## 2021-01-09 NOTE — Telephone Encounter (Signed)
Script sent  

## 2021-01-17 DIAGNOSIS — K589 Irritable bowel syndrome without diarrhea: Secondary | ICD-10-CM | POA: Diagnosis not present

## 2021-01-17 DIAGNOSIS — D125 Benign neoplasm of sigmoid colon: Secondary | ICD-10-CM | POA: Diagnosis not present

## 2021-01-17 DIAGNOSIS — Z86010 Personal history of colonic polyps: Secondary | ICD-10-CM | POA: Diagnosis not present

## 2021-01-17 DIAGNOSIS — K5730 Diverticulosis of large intestine without perforation or abscess without bleeding: Secondary | ICD-10-CM | POA: Diagnosis not present

## 2021-01-17 DIAGNOSIS — K648 Other hemorrhoids: Secondary | ICD-10-CM | POA: Diagnosis not present

## 2021-01-17 DIAGNOSIS — D120 Benign neoplasm of cecum: Secondary | ICD-10-CM | POA: Diagnosis not present

## 2021-01-22 DIAGNOSIS — D125 Benign neoplasm of sigmoid colon: Secondary | ICD-10-CM | POA: Diagnosis not present

## 2021-01-25 DIAGNOSIS — R922 Inconclusive mammogram: Secondary | ICD-10-CM | POA: Diagnosis not present

## 2021-01-25 DIAGNOSIS — R928 Other abnormal and inconclusive findings on diagnostic imaging of breast: Secondary | ICD-10-CM | POA: Diagnosis not present

## 2021-02-23 ENCOUNTER — Ambulatory Visit: Payer: Medicare Other | Admitting: Podiatry

## 2021-02-28 DIAGNOSIS — K219 Gastro-esophageal reflux disease without esophagitis: Secondary | ICD-10-CM | POA: Diagnosis not present

## 2021-02-28 DIAGNOSIS — I10 Essential (primary) hypertension: Secondary | ICD-10-CM | POA: Diagnosis not present

## 2021-02-28 DIAGNOSIS — E039 Hypothyroidism, unspecified: Secondary | ICD-10-CM | POA: Diagnosis not present

## 2021-02-28 DIAGNOSIS — M8580 Other specified disorders of bone density and structure, unspecified site: Secondary | ICD-10-CM | POA: Diagnosis not present

## 2021-03-02 ENCOUNTER — Ambulatory Visit: Payer: Medicare Other | Admitting: Podiatry

## 2021-03-06 ENCOUNTER — Other Ambulatory Visit: Payer: Self-pay | Admitting: Psychiatry

## 2021-03-06 DIAGNOSIS — F3342 Major depressive disorder, recurrent, in full remission: Secondary | ICD-10-CM

## 2021-03-16 ENCOUNTER — Ambulatory Visit (INDEPENDENT_AMBULATORY_CARE_PROVIDER_SITE_OTHER): Payer: Medicare Other

## 2021-03-16 ENCOUNTER — Ambulatory Visit: Payer: Medicare Other | Admitting: Podiatry

## 2021-03-16 ENCOUNTER — Other Ambulatory Visit: Payer: Self-pay

## 2021-03-16 DIAGNOSIS — M2042 Other hammer toe(s) (acquired), left foot: Secondary | ICD-10-CM | POA: Diagnosis not present

## 2021-03-16 DIAGNOSIS — M2041 Other hammer toe(s) (acquired), right foot: Secondary | ICD-10-CM

## 2021-03-19 NOTE — Progress Notes (Signed)
°  Subjective:  Patient ID: Debbie Schultz, female    DOB: 05-05-1950,  MRN: [id number]  Chief Complaint  Patient presents with   Hammer Toe     (xray)L foot 2nd toe    72 y.o. female presents with the above complaint. History confirmed with patient. States she is doing well she is able to hike and climb but she does have some pain after doing so. Would like to discuss this and consider options.  Objective:  Physical Exam: warm, good capillary refill, no trophic changes or ulcerative lesions, normal DP and PT pulses, and normal sensory exam. Left Foot: well healed 2nd toe with slight hyperextension deformity, fixed. POP distal aspect of the digit. No plantar IPJ pain.   Right Foot: mild POP 2nd/3rd toes right.    No images are attached to the encounter.  Radiographs: X-ray of the left foot: no fracture, dislocation, swelling or degenerative changes noted. Fused 2nd toe. Assessment:   1. Hammer toe of left foot   2. Hammertoe of right foot      Plan:  Patient was evaluated and treated and all questions answered.  Hammertoe -XR reviewed with patient -Educated on etiology of deformity -Dispensed toe caps -Could consider revision left 2nd toe should pain persist. It does slight hyperextension deformity. -Continue stretches RLE  No follow-ups on file.

## 2021-04-06 DIAGNOSIS — I8312 Varicose veins of left lower extremity with inflammation: Secondary | ICD-10-CM | POA: Diagnosis not present

## 2021-04-06 DIAGNOSIS — M79652 Pain in left thigh: Secondary | ICD-10-CM | POA: Diagnosis not present

## 2021-04-12 ENCOUNTER — Ambulatory Visit: Payer: Medicare Other | Admitting: Psychiatry

## 2021-05-02 ENCOUNTER — Other Ambulatory Visit: Payer: Self-pay

## 2021-05-02 DIAGNOSIS — F5101 Primary insomnia: Secondary | ICD-10-CM

## 2021-05-02 MED ORDER — DAYVIGO 5 MG PO TABS: 5.0000 mg | ORAL_TABLET | Freq: Every day | ORAL | 1 refills | Status: DC

## 2021-05-10 ENCOUNTER — Other Ambulatory Visit: Payer: Self-pay

## 2021-05-10 ENCOUNTER — Encounter: Payer: Self-pay | Admitting: Psychiatry

## 2021-05-10 ENCOUNTER — Ambulatory Visit: Payer: Medicare Other | Admitting: Psychiatry

## 2021-05-10 DIAGNOSIS — F3342 Major depressive disorder, recurrent, in full remission: Secondary | ICD-10-CM

## 2021-05-10 DIAGNOSIS — F5101 Primary insomnia: Secondary | ICD-10-CM | POA: Diagnosis not present

## 2021-05-10 MED ORDER — DESVENLAFAXINE SUCCINATE ER 50 MG PO TB24: ORAL_TABLET | ORAL | 1 refills | Status: DC

## 2021-05-10 MED ORDER — DESVENLAFAXINE SUCCINATE ER 100 MG PO TB24: ORAL_TABLET | ORAL | 1 refills | Status: DC

## 2021-05-10 MED ORDER — TRAZODONE HCL 100 MG PO TABS: ORAL_TABLET | ORAL | 1 refills | Status: DC

## 2021-05-10 NOTE — Progress Notes (Signed)
Debbie Schultz ?[id number] ?08-Jul-1950 71 y.o. ? ?Subjective:  ? ?Patient ID:  Debbie Schultz is a 71 y.o. (DOB 1950-10-04) female. ? ?Chief Complaint:  ?Chief Complaint  ?Patient presents with  ? Follow-up  ?  Depression, insomnia  ? ? ?HPI ?Debbie Schultz presents to the office today for follow-up of depression and insomnia. She denies any acute concerns today. Denies depressed mood. Sleeping well. She reports that she is staying asleep. Energy and motivation have been good. Appetite has been good. Concentration is adequate. Denies SI.  ? ?Sister had surgery on her ulnar nerve in January. Sister had surgery about a week ago on her big toe. Sister's recovery is going well and she has been caring for her sister.  Denies any other changes.  ? ?Enjoying rock climbing.  ? ?Took trip to [place] in late August. Planning to return in August.  ? ?Review of Systems:  ?Review of Systems  ?Gastrointestinal: Negative.   ?Musculoskeletal:  Positive for arthralgias. Negative for gait problem.  ?Neurological:  Negative for tremors and headaches.  ?Psychiatric/Behavioral:    ?     Please refer to HPI  ? ?Medications: I have reviewed the patient's current medications. ? ?Current Outpatient Medications  ?Medication Sig Dispense Refill  ? aspirin EC 81 MG tablet Take 81 mg by mouth daily.    ? Cholecalciferol (VITAMIN D3) 10000 units TABS Take by mouth.    ? clotrimazole-betamethasone (LOTRISONE) cream daily as needed.    ? fluticasone (FLONASE) 50 MCG/ACT nasal spray instill 1 spray into each nostril once daily  0  ? L-Methylfolate-Algae (DEPLIN 15) 15-90.314 MG CAPS Take 15 mg by mouth daily. 90 capsule 3  ? Lemborexant (DAYVIGO) 5 MG TABS Take 5 mg by mouth at bedtime. 90 tablet 1  ? liothyronine (CYTOMEL) 5 MCG tablet TK 2 TS PO QAM    ? Multiple Vitamins-Minerals (ZINC PO) Take by mouth.    ? OMEGA-3 FATTY ACIDS PO Take by mouth.    ? propranolol (INDERAL) 10 MG tablet Take 10 mg by mouth 2 (two) times daily.     ? zinc  gluconate 50 MG tablet Take 50 mg by mouth daily.    ? amoxicillin (AMOXIL) 500 MG tablet TK 4 TS PO 1 HOUR B TREATMENT    ? desvenlafaxine (PRISTIQ) 100 MG 24 hr tablet TAKE ONE TABLET BY MOUTH DAILY ALONG WITH 50 MILLIGRAM TABLET TO EQUAL TOTAL DOSE OF 150 MILLIGRAMS 90 tablet 1  ? desvenlafaxine (PRISTIQ) 50 MG 24 hr tablet TAKE ONE TABLET BY MOUTH DAILY, TAKE WITH 100MG  TABLET TO EQUAL 150MG  DAILY 90 tablet 1  ? meloxicam (MOBIC) 15 MG tablet Take 15 mg by mouth daily. (Patient not taking: Reported on 04/12/2020)    ? Multiple Vitamins-Minerals (ZINC PO) 1 tablet    ? traZODone (DESYREL) 100 MG tablet TAKE 1 TO 2 TABLETS BY MOUTH AT BEDTIME FOR SLEEP 180 tablet 1  ? ?No current facility-administered medications for this visit.  ? ? ?Medication Side Effects: None ? ?Allergies:  ?Allergies  ?Allergen Reactions  ? Symbicort [Budesonide-Formoterol Fumarate] Nausea Only  ? Bupropion Rash  ? Sulfamethoxazole Rash  ? ? ?Past Medical History:  ?Diagnosis Date  ? Anxiety   ? Arthritis   ? bilateral knees  ? Back pain   ? Depression   ? Dysrhythmia   ? "high heart rate".  seen last Dec 2018  ? Headache   ? resolved  ? Hypothyroid   ? Insomnia   ? ? ?Past  Medical History, Surgical history, Social history, and Family history were reviewed and updated as appropriate.  ? ?Please see review of systems for further details on the patient's review from today.  ? ?Objective:  ? ?Physical Exam:  ?There were no vitals taken for this visit. ? ?Physical Exam ?Constitutional:   ?   General: She is not in acute distress. ?Musculoskeletal:     ?   General: No deformity.  ?Neurological:  ?   Mental Status: She is alert and oriented to person, place, and time.  ?   Coordination: Coordination normal.  ?Psychiatric:     ?   Attention and Perception: Attention and perception normal. She does not perceive auditory or visual hallucinations.     ?   Mood and Affect: Mood normal. Mood is not anxious or depressed. Affect is not labile, blunt, angry  or inappropriate.     ?   Speech: Speech normal.     ?   Behavior: Behavior normal.     ?   Thought Content: Thought content normal. Thought content is not paranoid or delusional. Thought content does not include homicidal or suicidal ideation. Thought content does not include homicidal or suicidal plan.     ?   Cognition and Memory: Cognition and memory normal.     ?   Judgment: Judgment normal.  ?   Comments: Insight intact  ? ? ?Lab Review:  ?   ?Component Value Date/Time  ? NA 140 03/19/2018 2330  ? K 3.4 (L) 03/19/2018 2330  ? CL 110 03/19/2018 2330  ? CO2 21 (L) 03/19/2018 2330  ? GLUCOSE 171 (H) 03/19/2018 2330  ? BUN 13 03/19/2018 2330  ? CREATININE 0.99 03/19/2018 2330  ? CALCIUM 9.0 03/19/2018 2330  ? PROT 6.6 11/29/2015 1456  ? ALBUMIN 3.9 11/29/2015 1456  ? AST 17 11/29/2015 1456  ? ALT 34 11/29/2015 1456  ? ALKPHOS 70 11/29/2015 1456  ? BILITOT 0.3 11/29/2015 1456  ? GFRNONAA 59 (L) 03/19/2018 2330  ? GFRAA >60 03/19/2018 2330  ? ? ?   ?Component Value Date/Time  ? WBC 7.4 03/19/2018 2330  ? RBC 4.58 03/19/2018 2330  ? HGB 14.2 03/19/2018 2330  ? HCT 43.3 03/19/2018 2330  ? PLT 283 03/19/2018 2330  ? MCV 94.5 03/19/2018 2330  ? MCH 31.0 03/19/2018 2330  ? MCHC 32.8 03/19/2018 2330  ? RDW 11.9 03/19/2018 2330  ? LYMPHSABS 2.2 03/19/2018 2330  ? MONOABS 0.6 03/19/2018 2330  ? EOSABS 0.2 03/19/2018 2330  ? BASOSABS 0.1 03/19/2018 2330  ? ? ?No results found for: POCLITH, LITHIUM  ? ?No results found for: PHENYTOIN, PHENOBARB, VALPROATE, CBMZ  ? ?.res ?Assessment: Plan:   ?Will continue current plan of care since target signs and symptoms are well controlled without any tolerability issues. ?Continue Pristiq 150 mg po qd for depression.  ?Continue Dayvigo 5 mg po QHS for insomnia.  ?Continue Trazodone 100 mg 1-2 tabs po QHS for insomnia.  ?Pt to follow-up in 5-6 months or sooner if clinically indicated. ?Patient advised to contact office with any questions, adverse effects, or acute worsening in signs and  symptoms. ? ? ? ?Debbie Schultz was seen today for follow-up. ? ?Diagnoses and all orders for this visit: ? ?Recurrent major depressive disorder, in full remission (HCC) ?-     desvenlafaxine (PRISTIQ) 100 MG 24 hr tablet; TAKE ONE TABLET BY MOUTH DAILY ALONG WITH 50 MILLIGRAM TABLET TO EQUAL TOTAL DOSE OF 150 MILLIGRAMS ?-  desvenlafaxine (PRISTIQ) 50 MG 24 hr tablet; TAKE ONE TABLET BY MOUTH DAILY, TAKE WITH 100MG  TABLET TO EQUAL 150MG  DAILY ? ?Primary insomnia ?-     traZODone (DESYREL) 100 MG tablet; TAKE 1 TO 2 TABLETS BY MOUTH AT BEDTIME FOR SLEEP ? ?  ? ?Please see After Visit Summary for patient specific instructions. ? ?No future appointments. ? ?No orders of the defined types were placed in this encounter. ? ? ?------------------------------- ?

## 2021-05-15 DIAGNOSIS — Z0000 Encounter for general adult medical examination without abnormal findings: Secondary | ICD-10-CM | POA: Diagnosis not present

## 2021-05-15 DIAGNOSIS — I10 Essential (primary) hypertension: Secondary | ICD-10-CM | POA: Diagnosis not present

## 2021-05-15 DIAGNOSIS — E039 Hypothyroidism, unspecified: Secondary | ICD-10-CM | POA: Diagnosis not present

## 2021-05-15 DIAGNOSIS — F418 Other specified anxiety disorders: Secondary | ICD-10-CM | POA: Diagnosis not present

## 2021-05-15 DIAGNOSIS — Z5181 Encounter for therapeutic drug level monitoring: Secondary | ICD-10-CM | POA: Diagnosis not present

## 2021-05-18 ENCOUNTER — Other Ambulatory Visit: Payer: Self-pay | Admitting: Internal Medicine

## 2021-05-18 DIAGNOSIS — E2839 Other primary ovarian failure: Secondary | ICD-10-CM

## 2021-06-11 DIAGNOSIS — Z780 Asymptomatic menopausal state: Secondary | ICD-10-CM | POA: Diagnosis not present

## 2021-06-11 DIAGNOSIS — M8588 Other specified disorders of bone density and structure, other site: Secondary | ICD-10-CM | POA: Diagnosis not present

## 2021-06-15 DIAGNOSIS — M25511 Pain in right shoulder: Secondary | ICD-10-CM | POA: Diagnosis not present

## 2021-06-20 DIAGNOSIS — M25511 Pain in right shoulder: Secondary | ICD-10-CM | POA: Diagnosis not present

## 2021-08-07 ENCOUNTER — Telehealth: Payer: Self-pay | Admitting: Psychiatry

## 2021-08-07 DIAGNOSIS — F5101 Primary insomnia: Secondary | ICD-10-CM

## 2021-08-07 MED ORDER — DAYVIGO 5 MG PO TABS: 5.0000 mg | ORAL_TABLET | Freq: Every day | ORAL | 0 refills | Status: DC

## 2021-08-07 NOTE — Telephone Encounter (Signed)
Pt requesting Rx for Dayvigo @ [facility] [address]

## 2021-08-07 NOTE — Telephone Encounter (Signed)
cancelled

## 2021-08-07 NOTE — Telephone Encounter (Signed)
Script last filled 05/03/21 for a 90-day supply. Script sent to [facility]. Please cancel remaining refill at [facility] on [address] and [address]

## 2021-08-09 ENCOUNTER — Other Ambulatory Visit: Payer: Self-pay

## 2021-08-09 DIAGNOSIS — F5101 Primary insomnia: Secondary | ICD-10-CM

## 2021-08-09 MED ORDER — DAYVIGO 5 MG PO TABS: 5.0000 mg | ORAL_TABLET | Freq: Every day | ORAL | 2 refills | Status: DC

## 2021-08-09 NOTE — Telephone Encounter (Signed)
Prior Authorization submitted and approve for DAYVIGO 5 MG #30/30 day supply effective 08/09/2021-08/10/2022 with [facility] Medicare MF:[id number]

## 2021-08-10 ENCOUNTER — Ambulatory Visit (HOSPITAL_COMMUNITY)
Admission: RE | Admit: 2021-08-10 | Discharge: 2021-08-10 | Disposition: A | Discharge status: Home / Self Care | Payer: Medicare Other | Source: Ambulatory Visit | Attending: Internal Medicine | Admitting: Internal Medicine

## 2021-08-10 ENCOUNTER — Other Ambulatory Visit (HOSPITAL_COMMUNITY): Payer: Self-pay | Admitting: Internal Medicine

## 2021-08-10 ENCOUNTER — Other Ambulatory Visit: Payer: Self-pay | Admitting: Internal Medicine

## 2021-08-10 ENCOUNTER — Inpatient Hospital Stay (HOSPITAL_COMMUNITY): Admission: RE | Admit: 2021-08-10 | Payer: Medicare Other | Source: Ambulatory Visit

## 2021-08-10 DIAGNOSIS — M79662 Pain in left lower leg: Secondary | ICD-10-CM

## 2021-08-10 DIAGNOSIS — M79669 Pain in unspecified lower leg: Secondary | ICD-10-CM | POA: Diagnosis not present

## 2021-08-10 DIAGNOSIS — I809 Phlebitis and thrombophlebitis of unspecified site: Secondary | ICD-10-CM | POA: Diagnosis not present

## 2021-08-10 NOTE — Progress Notes (Signed)
Left lower extremity venous duplex has been completed. Preliminary results can be found in CV Proc through chart review.  Results were given to [facility] at Dr. Idelle Crouch office.  08/10/21 2:41 PM Olen Cordial RVT

## 2021-09-13 ENCOUNTER — Other Ambulatory Visit: Payer: Self-pay

## 2021-09-13 ENCOUNTER — Telehealth: Payer: Self-pay | Admitting: Psychiatry

## 2021-09-13 DIAGNOSIS — F5101 Primary insomnia: Secondary | ICD-10-CM

## 2021-09-13 MED ORDER — TRAZODONE HCL 100 MG PO TABS: ORAL_TABLET | ORAL | 0 refills | Status: DC

## 2021-09-13 MED ORDER — PROPRANOLOL HCL 10 MG PO TABS: 10.0000 mg | ORAL_TABLET | Freq: Two times a day (BID) | ORAL | 0 refills | Status: DC

## 2021-09-13 NOTE — Telephone Encounter (Signed)
Rx sent 

## 2021-09-13 NOTE — Telephone Encounter (Signed)
Pt called requesting RF Trazodone and Propranolol @ new pharmacy [facility] [address]. [address]3

## 2021-10-03 ENCOUNTER — Other Ambulatory Visit: Payer: Self-pay | Admitting: Psychiatry

## 2021-10-03 DIAGNOSIS — F3342 Major depressive disorder, recurrent, in full remission: Secondary | ICD-10-CM

## 2021-10-03 DIAGNOSIS — F5101 Primary insomnia: Secondary | ICD-10-CM

## 2021-10-03 NOTE — Telephone Encounter (Signed)
Pt called at 11:30a.  She is leaving for vacation on 8/1.  She is asking for refills of Trazodone, Dayvigo, Propanolol and Desvenlafaxine (50mg  and 100mg ) to be sent to on .  Next appt  8/3 (changed to telephone appt)

## 2021-10-03 NOTE — Telephone Encounter (Signed)
Pharmacy called and said pt is going to [place] for 3 months and wants her dayvigo filled early and wants a 90 day supply sent in

## 2021-10-04 NOTE — Telephone Encounter (Signed)
Appointment on 8/3 has been cancelled since she will be in [place] and [place]

## 2021-10-05 ENCOUNTER — Inpatient Hospital Stay (HOSPITAL_COMMUNITY)
Admission: EM | Admit: 2021-10-05 | Discharge: 2021-10-08 | DRG: 418 | Disposition: A | Discharge status: Home / Self Care | Payer: Medicare Other | Attending: Internal Medicine | Admitting: Internal Medicine

## 2021-10-05 ENCOUNTER — Other Ambulatory Visit: Payer: Self-pay

## 2021-10-05 ENCOUNTER — Encounter (HOSPITAL_COMMUNITY): Payer: Self-pay | Admitting: Emergency Medicine

## 2021-10-05 ENCOUNTER — Emergency Department (HOSPITAL_COMMUNITY): Payer: Medicare Other

## 2021-10-05 ENCOUNTER — Inpatient Hospital Stay (HOSPITAL_COMMUNITY): Payer: Medicare Other

## 2021-10-05 DIAGNOSIS — E039 Hypothyroidism, unspecified: Secondary | ICD-10-CM | POA: Diagnosis present

## 2021-10-05 DIAGNOSIS — Z9842 Cataract extraction status, left eye: Secondary | ICD-10-CM | POA: Diagnosis not present

## 2021-10-05 DIAGNOSIS — Z888 Allergy status to other drugs, medicaments and biological substances status: Secondary | ICD-10-CM

## 2021-10-05 DIAGNOSIS — K8050 Calculus of bile duct without cholangitis or cholecystitis without obstruction: Secondary | ICD-10-CM | POA: Diagnosis not present

## 2021-10-05 DIAGNOSIS — Z96653 Presence of artificial knee joint, bilateral: Secondary | ICD-10-CM | POA: Diagnosis not present

## 2021-10-05 DIAGNOSIS — R1013 Epigastric pain: Secondary | ICD-10-CM | POA: Diagnosis not present

## 2021-10-05 DIAGNOSIS — R7989 Other specified abnormal findings of blood chemistry: Principal | ICD-10-CM

## 2021-10-05 DIAGNOSIS — G4700 Insomnia, unspecified: Secondary | ICD-10-CM | POA: Diagnosis not present

## 2021-10-05 DIAGNOSIS — Z8249 Family history of ischemic heart disease and other diseases of the circulatory system: Secondary | ICD-10-CM | POA: Diagnosis not present

## 2021-10-05 DIAGNOSIS — R748 Abnormal levels of other serum enzymes: Secondary | ICD-10-CM | POA: Diagnosis not present

## 2021-10-05 DIAGNOSIS — R399 Unspecified symptoms and signs involving the genitourinary system: Secondary | ICD-10-CM | POA: Diagnosis not present

## 2021-10-05 DIAGNOSIS — Z823 Family history of stroke: Secondary | ICD-10-CM | POA: Diagnosis not present

## 2021-10-05 DIAGNOSIS — R945 Abnormal results of liver function studies: Secondary | ICD-10-CM | POA: Diagnosis not present

## 2021-10-05 DIAGNOSIS — N3001 Acute cystitis with hematuria: Secondary | ICD-10-CM | POA: Diagnosis not present

## 2021-10-05 DIAGNOSIS — K859 Acute pancreatitis without necrosis or infection, unspecified: Secondary | ICD-10-CM | POA: Diagnosis not present

## 2021-10-05 DIAGNOSIS — R1011 Right upper quadrant pain: Secondary | ICD-10-CM | POA: Diagnosis not present

## 2021-10-05 DIAGNOSIS — K219 Gastro-esophageal reflux disease without esophagitis: Secondary | ICD-10-CM | POA: Diagnosis present

## 2021-10-05 DIAGNOSIS — Z9049 Acquired absence of other specified parts of digestive tract: Secondary | ICD-10-CM | POA: Diagnosis not present

## 2021-10-05 DIAGNOSIS — K8070 Calculus of gallbladder and bile duct without cholecystitis without obstruction: Secondary | ICD-10-CM | POA: Diagnosis present

## 2021-10-05 DIAGNOSIS — Z86718 Personal history of other venous thrombosis and embolism: Secondary | ICD-10-CM

## 2021-10-05 DIAGNOSIS — K851 Biliary acute pancreatitis without necrosis or infection: Principal | ICD-10-CM

## 2021-10-05 DIAGNOSIS — Z9841 Cataract extraction status, right eye: Secondary | ICD-10-CM | POA: Diagnosis not present

## 2021-10-05 DIAGNOSIS — Z7982 Long term (current) use of aspirin: Secondary | ICD-10-CM | POA: Diagnosis not present

## 2021-10-05 DIAGNOSIS — Z882 Allergy status to sulfonamides status: Secondary | ICD-10-CM | POA: Diagnosis not present

## 2021-10-05 DIAGNOSIS — Z833 Family history of diabetes mellitus: Secondary | ICD-10-CM

## 2021-10-05 DIAGNOSIS — F419 Anxiety disorder, unspecified: Secondary | ICD-10-CM | POA: Diagnosis not present

## 2021-10-05 DIAGNOSIS — R1084 Generalized abdominal pain: Secondary | ICD-10-CM | POA: Diagnosis not present

## 2021-10-05 DIAGNOSIS — Z79899 Other long term (current) drug therapy: Secondary | ICD-10-CM | POA: Diagnosis not present

## 2021-10-05 DIAGNOSIS — E860 Dehydration: Secondary | ICD-10-CM | POA: Diagnosis not present

## 2021-10-05 DIAGNOSIS — M1990 Unspecified osteoarthritis, unspecified site: Secondary | ICD-10-CM | POA: Diagnosis not present

## 2021-10-05 DIAGNOSIS — F32A Depression, unspecified: Secondary | ICD-10-CM | POA: Diagnosis present

## 2021-10-05 DIAGNOSIS — N179 Acute kidney failure, unspecified: Secondary | ICD-10-CM | POA: Diagnosis not present

## 2021-10-05 DIAGNOSIS — K5710 Diverticulosis of small intestine without perforation or abscess without bleeding: Secondary | ICD-10-CM | POA: Diagnosis present

## 2021-10-05 DIAGNOSIS — K8010 Calculus of gallbladder with chronic cholecystitis without obstruction: Secondary | ICD-10-CM | POA: Diagnosis not present

## 2021-10-05 DIAGNOSIS — R1031 Right lower quadrant pain: Secondary | ICD-10-CM | POA: Diagnosis not present

## 2021-10-05 DIAGNOSIS — I700 Atherosclerosis of aorta: Secondary | ICD-10-CM | POA: Diagnosis not present

## 2021-10-05 DIAGNOSIS — F418 Other specified anxiety disorders: Secondary | ICD-10-CM | POA: Diagnosis not present

## 2021-10-05 DIAGNOSIS — Z7901 Long term (current) use of anticoagulants: Secondary | ICD-10-CM | POA: Diagnosis not present

## 2021-10-05 DIAGNOSIS — K8020 Calculus of gallbladder without cholecystitis without obstruction: Secondary | ICD-10-CM | POA: Diagnosis not present

## 2021-10-05 DIAGNOSIS — K8011 Calculus of gallbladder with chronic cholecystitis with obstruction: Secondary | ICD-10-CM

## 2021-10-05 DIAGNOSIS — Z87442 Personal history of urinary calculi: Secondary | ICD-10-CM | POA: Diagnosis not present

## 2021-10-05 DIAGNOSIS — I10 Essential (primary) hypertension: Secondary | ICD-10-CM | POA: Diagnosis not present

## 2021-10-05 LAB — CBC
HCT: 50.6 % — ABNORMAL HIGH (ref 36.0–46.0)
Hemoglobin: 16.9 g/dL — ABNORMAL HIGH (ref 12.0–15.0)
MCH: 31.2 pg (ref 26.0–34.0)
MCHC: 33.4 g/dL (ref 30.0–36.0)
MCV: 93.4 fL (ref 80.0–100.0)
Platelets: 408 10*3/uL — ABNORMAL HIGH (ref 150–400)
RBC: 5.42 MIL/uL — ABNORMAL HIGH (ref 3.87–5.11)
RDW: 12 % (ref 11.5–15.5)
WBC: 11.4 10*3/uL — ABNORMAL HIGH (ref 4.0–10.5)
nRBC: 0 % (ref 0.0–0.2)

## 2021-10-05 LAB — COMPREHENSIVE METABOLIC PANEL
ALT: 308 U/L — ABNORMAL HIGH (ref 0–44)
AST: 129 U/L — ABNORMAL HIGH (ref 15–41)
Albumin: 4 g/dL (ref 3.5–5.0)
Alkaline Phosphatase: 159 U/L — ABNORMAL HIGH (ref 38–126)
Anion gap: 8 (ref 5–15)
BUN: 14 mg/dL (ref 8–23)
CO2: 23 mmol/L (ref 22–32)
Calcium: 9.3 mg/dL (ref 8.9–10.3)
Chloride: 106 mmol/L (ref 98–111)
Creatinine, Ser: 1.23 mg/dL — ABNORMAL HIGH (ref 0.44–1.00)
GFR, Estimated: 47 mL/min — ABNORMAL LOW (ref >60)
Glucose, Bld: 162 mg/dL — ABNORMAL HIGH (ref 70–99)
Potassium: 3.9 mmol/L (ref 3.5–5.1)
Sodium: 137 mmol/L (ref 135–145)
Total Bilirubin: 1.8 mg/dL — ABNORMAL HIGH (ref 0.3–1.2)
Total Protein: 7.5 g/dL (ref 6.5–8.1)

## 2021-10-05 LAB — URINALYSIS, MICROSCOPIC (REFLEX)

## 2021-10-05 LAB — URINALYSIS, ROUTINE W REFLEX MICROSCOPIC
Glucose, UA: NEGATIVE mg/dL
Hgb urine dipstick: NEGATIVE
Ketones, ur: 15 mg/dL — AB
Leukocytes,Ua: NEGATIVE
Nitrite: NEGATIVE
Protein, ur: 30 mg/dL — AB
Specific Gravity, Urine: >1.03 — ABNORMAL HIGH (ref 1.005–1.030)
pH: 5.5 (ref 5.0–8.0)

## 2021-10-05 LAB — LIPASE, BLOOD: Lipase: 2650 U/L — ABNORMAL HIGH (ref 11–51)

## 2021-10-05 MED ORDER — MORPHINE SULFATE (PF) 4 MG/ML IV SOLN
4.0000 mg | Freq: Once | INTRAVENOUS | Status: AC
  Administered 2021-10-05: 4 mg via INTRAVENOUS
  Filled 2021-10-05: qty 1

## 2021-10-05 MED ORDER — FLUTICASONE PROPIONATE 50 MCG/ACT NA SUSP
1.0000 | Freq: Every day | NASAL | Status: DC
  Administered 2021-10-06 – 2021-10-08 (×3): 1 via NASAL
  Filled 2021-10-05: qty 16

## 2021-10-05 MED ORDER — ONDANSETRON HCL 4 MG/2ML IJ SOLN
4.0000 mg | Freq: Four times a day (QID) | INTRAMUSCULAR | Status: DC | PRN
Start: 2021-10-05 — End: 2021-10-08
  Administered 2021-10-06: 4 mg via INTRAVENOUS
  Filled 2021-10-05: qty 2

## 2021-10-05 MED ORDER — PROPRANOLOL HCL 10 MG PO TABS
10.0000 mg | ORAL_TABLET | Freq: Two times a day (BID) | ORAL | Status: DC
  Administered 2021-10-05 – 2021-10-08 (×6): 10 mg via ORAL
  Filled 2021-10-05 (×7): qty 1

## 2021-10-05 MED ORDER — IOHEXOL 350 MG/ML SOLN
65.0000 mL | Freq: Once | INTRAVENOUS | Status: AC | PRN
  Administered 2021-10-05: 65 mL via INTRAVENOUS

## 2021-10-05 MED ORDER — SODIUM CHLORIDE 0.9 % IV SOLN: INTRAVENOUS | Status: AC

## 2021-10-05 MED ORDER — ENOXAPARIN SODIUM 40 MG/0.4ML IJ SOSY
40.0000 mg | PREFILLED_SYRINGE | INTRAMUSCULAR | Status: DC
  Administered 2021-10-05 – 2021-10-07 (×3): 40 mg via SUBCUTANEOUS
  Filled 2021-10-05 (×3): qty 0.4

## 2021-10-05 MED ORDER — MORPHINE SULFATE (PF) 4 MG/ML IV SOLN
4.0000 mg | INTRAVENOUS | Status: DC | PRN
  Administered 2021-10-05 – 2021-10-08 (×5): 4 mg via INTRAVENOUS
  Filled 2021-10-05 (×5): qty 1

## 2021-10-05 MED ORDER — SODIUM CHLORIDE 0.9 % IV BOLUS
1000.0000 mL | Freq: Once | INTRAVENOUS | Status: AC
  Administered 2021-10-05: 1000 mL via INTRAVENOUS

## 2021-10-05 MED ORDER — LIOTHYRONINE SODIUM 5 MCG PO TABS
10.0000 ug | ORAL_TABLET | Freq: Every day | ORAL | Status: DC
  Administered 2021-10-06 – 2021-10-08 (×3): 10 ug via ORAL
  Filled 2021-10-05 (×3): qty 2

## 2021-10-05 MED ORDER — ZOLPIDEM TARTRATE 5 MG PO TABS
5.0000 mg | ORAL_TABLET | Freq: Every evening | ORAL | Status: DC | PRN
Start: 2021-10-05 — End: 2021-10-08
  Administered 2021-10-05: 5 mg via ORAL
  Filled 2021-10-05: qty 1

## 2021-10-05 MED ORDER — ONDANSETRON HCL 4 MG/2ML IJ SOLN
4.0000 mg | Freq: Once | INTRAMUSCULAR | Status: AC
  Administered 2021-10-05: 4 mg via INTRAVENOUS
  Filled 2021-10-05: qty 2

## 2021-10-05 MED ORDER — VENLAFAXINE HCL ER 150 MG PO CP24
150.0000 mg | ORAL_CAPSULE | Freq: Every day | ORAL | Status: DC
  Administered 2021-10-05: 150 mg via ORAL
  Filled 2021-10-05 (×2): qty 1

## 2021-10-05 MED ORDER — TRAZODONE HCL 50 MG PO TABS
200.0000 mg | ORAL_TABLET | Freq: Every day | ORAL | Status: DC
  Administered 2021-10-05: 200 mg via ORAL
  Administered 2021-10-06 – 2021-10-07 (×2): 150 mg via ORAL
  Filled 2021-10-05 (×3): qty 4

## 2021-10-05 MED ORDER — VENLAFAXINE HCL ER 150 MG PO CP24: 150.0000 mg | ORAL_CAPSULE | Freq: Every day | ORAL | Status: DC

## 2021-10-05 MED ORDER — LEMBOREXANT 5 MG PO TABS
5.0000 mg | ORAL_TABLET | Freq: Every day | ORAL | Status: DC
Start: 2021-10-05 — End: 2021-10-05

## 2021-10-05 MED ORDER — ASPIRIN 81 MG PO TBEC
81.0000 mg | DELAYED_RELEASE_TABLET | Freq: Every day | ORAL | Status: DC
  Administered 2021-10-05 – 2021-10-08 (×4): 81 mg via ORAL
  Filled 2021-10-05 (×4): qty 1

## 2021-10-05 NOTE — Consult Note (Signed)
Referring Provider: [facility] ED Primary Care Physician:  Renford Dills, MD Primary Gastroenterologist:  Dr. Levora Angel  Reason for Consultation:  Abdominal pain, elevated LFTs  HPI: Debbie Schultz is a 71 y.o. female with past medical history of DVT, anxiety and depression, GERD, and hypothyroid presenting today for abdominal pain.  Presented to the ED today for abdominal pain and elevated LFTs at primary care office.  Patient notes that starting on Monday she had a root canal after which she started taking amoxicillin.  After taking her first dose of amoxicillin she noted some nausea and vomiting as well as 1 episode of diarrhea.  O2 states that Thursday she continued to have nausea without vomiting.  Work-up very early Friday morning with severe abdominal pain.  Notes abdominal pain is worse in the epigastric and right upper quadrant region.  This prompted patient to be seen at [facility] P[facility] office. Labs at PCP office included hemoglobin 16.4 white blood cell 9.7, glucose 255, T. bili 2.5, ALP 194, AST 165, ALT 358. Patient was instructed to present to the ED for further evaluation.  In the ED lipase came back elevated to 2650, AST 129, ALT 308, ALP 159, T. bili 1.8  Colonoscopy 01/17/2021 Small tubular adenoma, recall 5 years  No Previous EGD.  Reports alcohol use on rare occasion Denies smoking Started Eliquis 1 month ago for DVT Plan to stay on Eliquis for 3 months total.   Past Medical History:  Diagnosis Date   Anxiety    Arthritis    bilateral knees   Back pain    Depression    Dysrhythmia    "high heart rate".  seen last Dec 2018   Headache    resolved   Hypothyroid    Insomnia     Past Surgical History:  Procedure Laterality Date   BLADDER SURGERY     BUNIONECTOMY     CATARACT EXTRACTION, BILATERAL     CYST EXCISION Left 03/18/2017   Procedure: EXCISION OF VOLAR CYST OF LEFT WRIST;  Surgeon: Cindee Salt, MD;  Location: [facility];  Service:  Orthopedics;  Laterality: Left;   KNEE ARTHROSCOPY     REPLACEMENT TOTAL KNEE Right 2008   REPLACEMENT TOTAL KNEE Left 2012   TOE SURGERY     TONSILLECTOMY      Prior to Admission medications   Medication Sig Start Date End Date Taking? Authorizing Provider  amoxicillin (AMOXIL) 500 MG tablet TK 4 TS PO 1 HOUR B TREATMENT 10/14/18   [provider]  aspirin EC 81 MG tablet Take 81 mg by mouth daily.    [provider]  Cholecalciferol (VITAMIN D3) 10000 units TABS Take by mouth.    [provider]  clotrimazole-betamethasone (LOTRISONE) cream daily as needed. 05/05/18   [provider]  desvenlafaxine (PRISTIQ) 100 MG 24 hr tablet TAKE ONE TABLET BY MOUTH DAILY ALONG WITH 50 MILLIGRAM TABLET TO EQUAL TOTAL DOSE OF 150 MILLIGRAMS 05/10/21   Corie Chiquito, PMHNP  desvenlafaxine (PRISTIQ) 50 MG 24 hr tablet TAKE ONE TABLET BY MOUTH DAILY, TAKE WITH 100MG  TABLET TO EQUAL 150MG  DAILY 05/10/21   , PMHNP  fluticasone (FLONASE) 50 MCG/ACT nasal spray instill 1 spray into each nostril once daily 08/01/16   [provider]  L-Methylfolate-Algae (DEPLIN 15) 15-90.314 MG CAPS Take 15 mg by mouth daily. 01/09/21   08/03/16, PMHNP  Lemborexant (DAYVIGO) 5 MG TABS Take 5 mg by mouth at bedtime. 08/09/21   Corie Chiquito, PMHNP  liothyronine (CYTOMEL) 5 MCG tablet TK 2 TS PO QAM 04/27/18   [provider]  meloxicam (MOBIC) 15 MG tablet Take 15 mg by mouth daily. Patient not taking: Reported on 04/12/2020 11/14/19   [provider]  Multiple Vitamins-Minerals (ZINC PO) Take by mouth.    [provider]  Multiple Vitamins-Minerals (ZINC PO) 1 tablet    [provider]  OMEGA-3 FATTY ACIDS PO Take by mouth.    [provider]  propranolol (INDERAL) 10 MG tablet Take 1 tablet (10 mg total) by mouth 2 (two) times daily. Take 10 mg by mouth 2 (two) times daily. 09/13/21   Corie Chiquito, PMHNP  traZODone (DESYREL)  100 MG tablet TAKE 1 TO 2 TABLETS BY MOUTH AT BEDTIME FOR SLEEP 09/13/21   Corie Chiquito, PMHNP  zinc gluconate 50 MG tablet Take 50 mg by mouth daily.    [provider]    Scheduled Meds:  aspirin EC  81 mg Oral Daily   enoxaparin (LOVENOX) injection  40 mg Subcutaneous Q24H   fluticasone  1 spray Each Nare Daily   Lemborexant  5 mg Oral QHS   [START ON 10/06/2021] liothyronine  10 mcg Oral Daily   propranolol  10 mg Oral BID   traZODone  200 mg Oral QHS   [START ON 10/06/2021] venlafaxine XR  150 mg Oral Q breakfast   Continuous Infusions:  sodium chloride     PRN Meds:.morphine injection, ondansetron (ZOFRAN) IV  Allergies as of 10/05/2021 - Review Complete 10/05/2021  Allergen Reaction Noted   Symbicort [budesonide-formoterol fumarate] Nausea Only 09/22/2015   Bupropion Rash 07/20/2015   Sulfamethoxazole Rash 07/20/2015    Family History  Problem Relation Age of Onset   Heart disease Mother        PPM   Heart attack Father    Diabetes Maternal Aunt    Stroke Paternal Grandmother    Headache Neg Hx     Social History   Socioeconomic History   Marital status: Unknown    Spouse name: Not on file   Number of children: 0   Years of education: Masters   Highest education level: Not on file  Occupational History   Occupation: Retired    Comment: Runner, broadcasting/film/video  Tobacco Use   Smoking status: Never   Smokeless tobacco: Never  Vaping Use   Vaping Use: Never used  Substance and Sexual Activity   Alcohol use: Yes    Alcohol/week: 0.0 standard drinks of alcohol    Comment: once a month   Drug use: No   Sexual activity: Not on file  Other Topics Concern   Not on file  Social History Narrative   Lives with mother.   Caffeine use: 1 cup coffee every morning   Tea rare   Social Determinants of Health   Financial Resource Strain: Not on file  Food Insecurity: Not on file  Transportation Needs: Not on file  Physical Activity: Not on file  Stress: Not on file   Social Connections: Not on file  Intimate Partner Violence: Not on file    Review of Systems: All negative except as stated above in HPI.  Physical Exam:Physical Exam Constitutional:      General: She is not in acute distress.    Appearance: Normal appearance. She is normal weight.  HENT:     Head: Normocephalic and atraumatic.     Right Ear: External ear normal.     Left Ear: External ear normal.  Nose: Nose normal.     Mouth/Throat:     Mouth: Mucous membranes are moist.  Eyes:     General: No scleral icterus.    Pupils: Pupils are equal, round, and reactive to light.  Cardiovascular:     Rate and Rhythm: Regular rhythm. Tachycardia present.     Pulses: Normal pulses.     Heart sounds: Normal heart sounds.  Pulmonary:     Effort: Pulmonary effort is normal.     Breath sounds: Normal breath sounds.  Abdominal:     General: Abdomen is flat. Bowel sounds are normal. There is no distension.     Palpations: Abdomen is soft. There is no mass.     Tenderness: There is abdominal tenderness (mild tender to RUQ and epigastric region). There is no guarding or rebound.     Hernia: No hernia is present.  Musculoskeletal:        General: No swelling. Normal range of motion.     Cervical back: Normal range of motion and neck supple.  Skin:    General: Skin is warm and dry.     Coloration: Skin is not jaundiced.  Neurological:     General: No focal deficit present.     Mental Status: She is alert and oriented to person, place, and time. Mental status is at baseline.  Psychiatric:        Mood and Affect: Mood normal.        Behavior: Behavior normal.     Vital signs: Vitals:   10/05/21 1445 10/05/21 1450  BP: 128/89   Pulse: (!) 113 (!) 103  Resp:    Temp:    SpO2: 94% 94%        GI:  Lab Results: Recent Labs    10/05/21 1359  WBC 11.4*  HGB 16.9*  HCT 50.6*  PLT 408*   BMET Recent Labs    10/05/21 1359  NA 137  K 3.9  CL 106  CO2 23  GLUCOSE 162*   BUN 14  CREATININE 1.23*  CALCIUM 9.3   LFT Recent Labs    10/05/21 1359  PROT 7.5  ALBUMIN 4.0  AST 129*  ALT 308*  ALKPHOS 159*  BILITOT 1.8*   PT/INR No results for input(s): "LABPROT", "INR" in the last 72 hours.   Studies/Results: US Abdomen Limited RUQ (LIVER/GB)  Result Date: 10/05/2021 CLINICAL DATA:  Right lower quadrant pain EXAM: ULTRASOUND ABDOMEN LIMITED RIGHT UPPER QUADRANT COMPARISON:  None Available. FINDINGS: Gallbladder: Shadowing gallstones are seen measuring up to 1.6 cm. There is no gallbladder wall thickening or pericholecystic fluid. Sonographic Murphy's sign was reported negative. Common bile duct: Diameter: 8 mm.  There is no intrahepatic biliary ductal dilatation. Liver: No focal lesion identified. Within normal limits in parenchymal echogenicity. Portal vein is patent on color Doppler imaging with normal direction of blood flow towards the liver. Other: An echogenic focus in the right kidney measuring 9 mm likely reflects a renal stone. There is no hydronephrosis. IMPRESSION: 1. Cholelithiasis without evidence of acute cholecystitis. 2. 9 mm nonobstructing right renal stone.  No hydronephrosis. Electronically Signed   By: Lesia Hausen M.D.   On: 10/05/2021 14:50    Impression: Abdominal pain  HGB 16.9 Platelets 408 AST 129 ALT 308  Alkphos 159 TBili 1.8 Lipase 2650  Right upper quadrant ultrasound 09/27/2021 1. Cholelithiasis without evidence of acute cholecystitis. 2. 9 mm nonobstructing right renal stone.  No hydronephrosis  Elevated lipase, patient likely has pancreatitis.  Ordering  CT scan with contrast of the abdomen to evaluate for peripancreatic fat stranding or other signs of acute pancreatitis.  Likely gallstone pancreatitis with cholelithiasis found.  No evidence of biliary obstruction.  Likely elevated LFTs due to inflammation from pancreatitis.  No significant alcohol use.  Plan: Pain control as needed Continue IV fluids at 125 mL/h,  advance diet as tolerated. Can follow LFTs to ensure they return to normal, no need to follow lipase. Ordered CT abdomen with contrast to evaluate for further signs of pancreatitis. [facility] will follow  LOS: 0 days   Emmit Alexanders  PA-C 10/05/2021, 4:47 PM  Contact #  [phone]

## 2021-10-05 NOTE — ED Provider Triage Note (Signed)
Emergency Medicine Provider Triage Evaluation Note  Debbie Schultz , a 71 y.o. female  was evaluated in triage.  Pt complains of RUQ abdominal pain.  States that she has had same since Tuesday with associated nausea and vomiting.  She states all this started after she started taking amoxicillin after a root canal.  States that she went to her PCP and was told that her liver enzymes were high and to come here for further evaluation.  She presents today for same.  Review of Systems  Positive:  Negative:   Physical Exam  BP 132/90 (BP Location: Right Arm)   Pulse (!) 117   Temp 99.3 F (37.4 C) (Oral)   Resp 18   SpO2 92%  Gen:   Awake, no distress   Resp:  Normal effort  MSK:   Moves extremities without difficulty  Other:  RUQ tenderness  Medical Decision Making  Medically screening exam initiated at 1:58 PM.  Appropriate orders placed.  Jona Erkkila was informed that the remainder of the evaluation will be completed by another provider, this initial triage assessment does not replace that evaluation, and the importance of remaining in the ED until their evaluation is complete.     Silva Bandy, PA-C 10/05/21 1359

## 2021-10-05 NOTE — H&P (Signed)
History and Physical    Rodolfo Notaro LFY:[id number] DOB: 1950/10/06 DOA: 10/05/2021  PCP: Renford Dills, MD (Confirm with patient/family/NH records and if not entered, this has to be entered at [facility] point of entry) Patient coming from: Home  I have personally briefly reviewed patient's old medical records in [facility]  Chief Complaint: Stomach hurts  HPI: Debbie Schultz is a 71 y.o. female with medical history significant of anxiety/depression, hypothyroidism, came in with worsening of epigastric abdominal pain.  Symptoms started 3 days ago, when patient developed a new epigastric abdominal pain after eating dinner, on and off, nonradiating, no fever or chills feeling nauseous but no vomiting no diarrhea.  Last 2 days, patient experienced increasing episodes of similar pain, and last night pain became constant 10/10.  Again no fever chills no diarrhea, feeling nauseous but no vomiting.  This morning, she went to her see PCP who performed a blood work and found significant elevation of LFTs and called patient to come to ED.  ED Course: Afebrile, no tachycardia no hypotension no hypoxia.  Elevation of LFTs AST 129 ALT 348, creatinine 1.2, WBC 11.4, lipase 1600.  RUQ ultrasound showed cholelithiasis but no significant CBD stone.  Review of Systems: As per HPI otherwise 14 point review of systems negative.    Past Medical History:  Diagnosis Date   Anxiety    Arthritis    bilateral knees   Back pain    Depression    Dysrhythmia    "high heart rate".  seen last Dec 2018   Headache    resolved   Hypothyroid    Insomnia     Past Surgical History:  Procedure Laterality Date   BLADDER SURGERY     BUNIONECTOMY     CATARACT EXTRACTION, BILATERAL     CYST EXCISION Left 03/18/2017   Procedure: EXCISION OF VOLAR CYST OF LEFT WRIST;  Surgeon: Cindee Salt, MD;  Location: [facility];  Service: Orthopedics;  Laterality: Left;   KNEE ARTHROSCOPY     REPLACEMENT  TOTAL KNEE Right 2008   REPLACEMENT TOTAL KNEE Left 2012   TOE SURGERY     TONSILLECTOMY       reports that she has never smoked. She has never used smokeless tobacco. She reports current alcohol use. She reports that she does not use drugs.  Allergies  Allergen Reactions   Symbicort [Budesonide-Formoterol Fumarate] Nausea Only   Bupropion Rash   Sulfamethoxazole Rash    Family History  Problem Relation Age of Onset   Heart disease Mother        PPM   Heart attack Father    Diabetes Maternal Aunt    Stroke Paternal Grandmother    Headache Neg Hx      Prior to Admission medications   Medication Sig Start Date End Date Taking? Authorizing Provider  amoxicillin (AMOXIL) 500 MG tablet TK 4 TS PO 1 HOUR B TREATMENT 10/14/18   [provider]  aspirin EC 81 MG tablet Take 81 mg by mouth daily.    [provider]  Cholecalciferol (VITAMIN D3) 10000 units TABS Take by mouth.    [provider]  clotrimazole-betamethasone (LOTRISONE) cream daily as needed. 05/05/18   [provider]  desvenlafaxine (PRISTIQ) 100 MG 24 hr tablet TAKE ONE TABLET BY MOUTH DAILY ALONG WITH 50 MILLIGRAM TABLET TO EQUAL TOTAL DOSE OF 150 MILLIGRAMS 05/10/21   Corie Chiquito, PMHNP  desvenlafaxine (PRISTIQ) 50 MG 24 hr tablet TAKE ONE TABLET BY MOUTH  DAILY, TAKE WITH 100MG  TABLET TO EQUAL 150MG  DAILY 05/10/21   , PMHNP  fluticasone [facility]) 50 MCG/ACT nasal spray instill 1 spray into each nostril once daily 08/01/16   [provider]  L-Methylfolate-Algae (DEPLIN 15) 15-90.314 MG CAPS Take 15 mg by mouth daily. 01/09/21   08/03/16, PMHNP  Lemborexant (DAYVIGO) 5 MG TABS Take 5 mg by mouth at bedtime. 08/09/21   Corie Chiquito, PMHNP  liothyronine (CYTOMEL) 5 MCG tablet TK 2 TS PO QAM 04/27/18   [provider]  meloxicam (MOBIC) 15 MG tablet Take 15 mg by mouth daily. Patient not taking: Reported on 04/12/2020 11/14/19   [provider]   Multiple Vitamins-Minerals (ZINC PO) Take by mouth.    [provider]  Multiple Vitamins-Minerals (ZINC PO) 1 tablet    [provider]  OMEGA-3 FATTY ACIDS PO Take by mouth.    [provider]  propranolol (INDERAL) 10 MG tablet Take 1 tablet (10 mg total) by mouth 2 (two) times daily. Take 10 mg by mouth 2 (two) times daily. 09/13/21   01/14/20, PMHNP  traZODone (DESYREL) 100 MG tablet TAKE 1 TO 2 TABLETS BY MOUTH AT BEDTIME FOR SLEEP 09/13/21   Corie Chiquito, PMHNP  zinc gluconate 50 MG tablet Take 50 mg by mouth daily.    [provider]    Physical Exam: Vitals:   10/05/21 1430 10/05/21 1445 10/05/21 1450 10/05/21 1719  BP: 134/85 128/89  134/75  Pulse: 100 (!) 113 (!) 103 99  Resp:    16  Temp:    98.8 F (37.1 C)  TempSrc:    Oral  SpO2: 93% 94% 94% 97%    Constitutional: NAD, calm, comfortable Vitals:   10/05/21 1430 10/05/21 1445 10/05/21 1450 10/05/21 1719  BP: 134/85 128/89  134/75  Pulse: 100 (!) 113 (!) 103 99  Resp:    16  Temp:    98.8 F (37.1 C)  TempSrc:    Oral  SpO2: 93% 94% 94% 97%   Eyes: PERRL, lids and conjunctivae normal ENMT: Mucous membranes are moist. Posterior pharynx clear of any exudate or lesions.Normal dentition.  Neck: normal, supple, no masses, no thyromegaly Respiratory: clear to auscultation bilaterally, no wheezing, no crackles. Normal respiratory effort. No accessory muscle use.  Cardiovascular: Regular rate and rhythm, no murmurs / rubs / gallops. No extremity edema. 2+ pedal pulses. No carotid bruits.  Abdomen: mild tenderness on epigastric area, no rebound no guarding no Murphy sign, no masses palpated. No hepatosplenomegaly. Bowel sounds positive.  Musculoskeletal: no clubbing / cyanosis. No joint deformity upper and lower extremities. Good ROM, no contractures. Normal muscle tone.  Skin: no rashes, lesions, ulcers. No induration Neurologic: CN 2-12 grossly intact. Sensation intact, DTR  normal. Strength 5/5 in all 4.  Psychiatric: Normal judgment and insight. Alert and oriented x 3. Normal mood.     Labs on Admission: I have personally reviewed following labs and imaging studies  CBC: Recent Labs  Lab 10/05/21 1359  WBC 11.4*  HGB 16.9*  HCT 50.6*  MCV 93.4  PLT 408*   Basic Metabolic Panel: Recent Labs  Lab 10/05/21 1359  NA 137  K 3.9  CL 106  CO2 23  GLUCOSE 162*  BUN 14  CREATININE 1.23*  CALCIUM 9.3   GFR: CrCl cannot be calculated (Unknown ideal weight.). Liver Function Tests: Recent Labs  Lab 10/05/21 1359  AST 129*  ALT 308*  ALKPHOS 159*  BILITOT 1.8*  PROT 7.5  ALBUMIN 4.0   Recent Labs  Lab 10/05/21 1359  LIPASE 2,650*   No results for input(s): "AMMONIA" in the last 168 hours. Coagulation Profile: No results for input(s): "INR", "PROTIME" in the last 168 hours. Cardiac Enzymes: No results for input(s): "CKTOTAL", "CKMB", "CKMBINDEX", "TROPONINI" in the last 168 hours. BNP (last 3 results) No results for input(s): "PROBNP" in the last 8760 hours. HbA1C: No results for input(s): "HGBA1C" in the last 72 hours. CBG: No results for input(s): "GLUCAP" in the last 168 hours. Lipid Profile: No results for input(s): "CHOL", "HDL", "LDLCALC", "TRIG", "CHOLHDL", "LDLDIRECT" in the last 72 hours. Thyroid Function Tests: No results for input(s): "TSH", "T4TOTAL", "FREET4", "T3FREE", "THYROIDAB" in the last 72 hours. Anemia Panel: No results for input(s): "VITAMINB12", "FOLATE", "FERRITIN", "TIBC", "IRON", "RETICCTPCT" in the last 72 hours. Urine analysis:    Component Value Date/Time   COLORURINE YELLOW 10/05/2021 1355   APPEARANCEUR CLEAR 10/05/2021 1355   LABSPEC >1.030 (H) 10/05/2021 1355   PHURINE 5.5 10/05/2021 1355   GLUCOSEU NEGATIVE 10/05/2021 1355   HGBUR NEGATIVE 10/05/2021 1355   BILIRUBINUR MODERATE (A) 10/05/2021 1355   KETONESUR 15 (A) 10/05/2021 1355   PROTEINUR 30 (A) 10/05/2021 1355   NITRITE NEGATIVE  10/05/2021 1355   LEUKOCYTESUR NEGATIVE 10/05/2021 1355    Radiological Exams on Admission: US Abdomen Limited RUQ (LIVER/GB)  Result Date: 10/05/2021 CLINICAL DATA:  Right lower quadrant pain EXAM: ULTRASOUND ABDOMEN LIMITED RIGHT UPPER QUADRANT COMPARISON:  None Available. FINDINGS: Gallbladder: Shadowing gallstones are seen measuring up to 1.6 cm. There is no gallbladder wall thickening or pericholecystic fluid. Sonographic Murphy's sign was reported negative. Common bile duct: Diameter: 8 mm.  There is no intrahepatic biliary ductal dilatation. Liver: No focal lesion identified. Within normal limits in parenchymal echogenicity. Portal vein is patent on color Doppler imaging with normal direction of blood flow towards the liver. Other: An echogenic focus in the right kidney measuring 9 mm likely reflects a renal stone. There is no hydronephrosis. IMPRESSION: 1. Cholelithiasis without evidence of acute cholecystitis. 2. 9 mm nonobstructing right renal stone.  No hydronephrosis. Electronically Signed   By: Lesia Hausen M.D.   On: 10/05/2021 14:50    EKG: Independently reviewed.   Assessment/Plan Principal Problem:   Choledocholithiasis Active Problems:   Acute gallstone pancreatitis  (please populate well all problems here in Problem List. (For example, if patient is on BP meds at home and you resume or decide to hold them, it is a problem that needs to be her. Same for CAD, COPD, HLD and so on)  Acute gallstone pancreatitis -GI consulted, GI consultation appreciated, CT abdomen pelvis pending, MRCP as per GI. -Lipase trending -Liquid diet, IV fluids and pain meds. -Discussed with on-call general surgeon, recommend postponing cholecystectomy until pancreatitis subsided  Leukocytosis -Currently, notes no symptoms signs of ascending cholangitis, leukocytosis more likely from hemoconcentration as hemoglobin level also increased.  On IV fluid, recheck level  tomorrow.  Hypothyroidism -Stable  Anxiety/depression -Stable, outpatient follow-up with psychiatry.  DVT prophylaxis: Lovenox Code Status: Full code Family Communication: Sister at bedside Disposition Plan: Patient sick with gallstone pancreatitis, expect ERCP and surgical intervention, expect more than 2 midnight hospital stay. Consults called: GI, general surgery Admission status: MedSurg admission   Emeline General MD [facility] Pager [phone]  10/05/2021, 5:25 PM

## 2021-10-05 NOTE — ED Provider Notes (Signed)
[facility] EMERGENCY DEPARTMENT Provider Note   CSN: [id number] Arrival date & time: 10/05/21  1348     History  Chief Complaint  Patient presents with   Abdominal Pain    Debbie Schultz is a 71 y.o. female with past medical history of DVT, anxiety and depression, GERD, and hypothyroid presenting today for abdominal pain.  Patient was evaluated for this abdominal pain pain today by her PCP.  Labs at her clinic revealed elevated liver enzymes prompting further evaluation in the emergency department.  Patient states that the abdominal pain started on Tuesday just after she had been prescribed amoxicillin from the dentist for a recent root canal.  She attributes her pain to the antibiotic treatment.  Since Tuesday her appetite has been poor she does endorse nausea vomiting and diarrhea.  She states that the pain is epigastric and radiates to the right and left her upper abdomen and down to her back.  She states that Zofran has helped with her nausea.  She recently started taking pantoprazole for the reflux.    Abdominal Pain      Home Medications Prior to Admission medications   Medication Sig Start Date End Date Taking? Authorizing Provider  amoxicillin (AMOXIL) 500 MG tablet TK 4 TS PO 1 HOUR B TREATMENT 10/14/18   [provider]  aspirin EC 81 MG tablet Take 81 mg by mouth daily.    [provider]  Cholecalciferol (VITAMIN D3) 10000 units TABS Take by mouth.    [provider]  clotrimazole-betamethasone (LOTRISONE) cream daily as needed. 05/05/18   [provider]  desvenlafaxine (PRISTIQ) 100 MG 24 hr tablet TAKE ONE TABLET BY MOUTH DAILY ALONG WITH 50 MILLIGRAM TABLET TO EQUAL TOTAL DOSE OF 150 MILLIGRAMS 05/10/21   Corie Chiquito, PMHNP  desvenlafaxine (PRISTIQ) 50 MG 24 hr tablet TAKE ONE TABLET BY MOUTH DAILY, TAKE WITH 100MG  TABLET TO EQUAL 150MG  DAILY 05/10/21   , PMHNP  fluticasone (FLONASE) 50 MCG/ACT nasal  spray instill 1 spray into each nostril once daily 08/01/16   [provider]  L-Methylfolate-Algae (DEPLIN 15) 15-90.314 MG CAPS Take 15 mg by mouth daily. 01/09/21   08/03/16, PMHNP  Lemborexant (DAYVIGO) 5 MG TABS Take 5 mg by mouth at bedtime. 08/09/21   Corie Chiquito, PMHNP  liothyronine (CYTOMEL) 5 MCG tablet TK 2 TS PO QAM 04/27/18   [provider]  meloxicam (MOBIC) 15 MG tablet Take 15 mg by mouth daily. Patient not taking: Reported on 04/12/2020 11/14/19   [provider]  Multiple Vitamins-Minerals (ZINC PO) Take by mouth.    [provider]  Multiple Vitamins-Minerals (ZINC PO) 1 tablet    [provider]  OMEGA-3 FATTY ACIDS PO Take by mouth.    [provider]  propranolol (INDERAL) 10 MG tablet Take 1 tablet (10 mg total) by mouth 2 (two) times daily. Take 10 mg by mouth 2 (two) times daily. 09/13/21   01/14/20, PMHNP  traZODone (DESYREL) 100 MG tablet TAKE 1 TO 2 TABLETS BY MOUTH AT BEDTIME FOR SLEEP 09/13/21   Corie Chiquito, PMHNP  zinc gluconate 50 MG tablet Take 50 mg by mouth daily.    [provider]      Allergies    Symbicort [budesonide-formoterol fumarate], Bupropion, and Sulfamethoxazole    Review of Systems   Review of Systems  Gastrointestinal:  Positive for abdominal pain.    Physical Exam Updated Vital Signs BP 128/89   Pulse (!) 103  Temp 99.3 F (37.4 C) (Oral)   Resp 16   SpO2 94%  Physical Exam Vitals and nursing note reviewed.  HENT:     Head: Normocephalic and atraumatic.     Mouth/Throat:     Mouth: Mucous membranes are moist.  Eyes:     General:        Right eye: No discharge.        Left eye: No discharge.     Conjunctiva/sclera: Conjunctivae normal.  Cardiovascular:     Rate and Rhythm: Normal rate and regular rhythm.     Pulses: Normal pulses.     Heart sounds: Normal heart sounds.  Pulmonary:     Effort: Pulmonary effort is normal.     Breath sounds: Normal  breath sounds.  Abdominal:     General: Abdomen is flat and protuberant. There is no distension.     Palpations: Abdomen is soft.     Tenderness: There is abdominal tenderness in the right upper quadrant, epigastric area and left upper quadrant. There is no guarding or rebound.     Comments: Bilateral CVA tenderness on percussion  Skin:    General: Skin is warm and dry.  Neurological:     General: No focal deficit present.  Psychiatric:        Mood and Affect: Mood normal.     ED Results / Procedures / Treatments   Labs (all labs ordered are listed, but only abnormal results are displayed) Labs Reviewed  COMPREHENSIVE METABOLIC PANEL - Abnormal; Notable for the following components:      Result Value   Glucose, Bld 162 (*)    Creatinine, Ser 1.23 (*)    AST 129 (*)    ALT 308 (*)    Alkaline Phosphatase 159 (*)    Total Bilirubin 1.8 (*)    GFR, Estimated 47 (*)    All other components within normal limits  CBC - Abnormal; Notable for the following components:   WBC 11.4 (*)    RBC 5.42 (*)    Hemoglobin 16.9 (*)    HCT 50.6 (*)    Platelets 408 (*)    All other components within normal limits  URINALYSIS, ROUTINE W REFLEX MICROSCOPIC - Abnormal; Notable for the following components:   Specific Gravity, Urine >1.030 (*)    Bilirubin Urine MODERATE (*)    Ketones, ur 15 (*)    Protein, ur 30 (*)    All other components within normal limits  URINALYSIS, MICROSCOPIC (REFLEX) - Abnormal; Notable for the following components:   Bacteria, UA RARE (*)    All other components within normal limits  LIPASE, BLOOD  HIV ANTIBODY (ROUTINE TESTING W REFLEX)     Radiology US Abdomen Limited RUQ (LIVER/GB)  Result Date: 10/05/2021 CLINICAL DATA:  Right lower quadrant pain EXAM: ULTRASOUND ABDOMEN LIMITED RIGHT UPPER QUADRANT COMPARISON:  None Available. FINDINGS: Gallbladder: Shadowing gallstones are seen measuring up to 1.6 cm. There is no gallbladder wall thickening or  pericholecystic fluid. Sonographic Murphy's sign was reported negative. Common bile duct: Diameter: 8 mm.  There is no intrahepatic biliary ductal dilatation. Liver: No focal lesion identified. Within normal limits in parenchymal echogenicity. Portal vein is patent on color Doppler imaging with normal direction of blood flow towards the liver. Other: An echogenic focus in the right kidney measuring 9 mm likely reflects a renal stone. There is no hydronephrosis. IMPRESSION: 1. Cholelithiasis without evidence of acute cholecystitis. 2. 9 mm nonobstructing right renal stone.  No hydronephrosis.  Electronically Signed   By: Valetta Mole M.D.   On: 10/05/2021 14:50     Medications Ordered in ED Medications  morphine (PF) 4 MG/ML injection 4 mg (has no administration in time range)  ondansetron (ZOFRAN) injection 4 mg (has no administration in time range)  0.9 %  sodium chloride infusion (has no administration in time range)  aspirin EC tablet 81 mg (has no administration in time range)  propranolol (INDERAL) tablet 10 mg (has no administration in time range)  venlafaxine XR (EFFEXOR-XR) 24 hr capsule 150 mg (has no administration in time range)  Lemborexant TABS 5 mg (has no administration in time range)  traZODone (DESYREL) tablet 200 mg (has no administration in time range)  liothyronine (CYTOMEL) tablet 10 mcg (has no administration in time range)  fluticasone (FLONASE) 50 MCG/ACT nasal spray 1 spray (has no administration in time range)  enoxaparin (LOVENOX) injection 40 mg (has no administration in time range)  morphine (PF) 4 MG/ML injection 4 mg (4 mg Intravenous Given 10/05/21 1533)  ondansetron (ZOFRAN) injection 4 mg (4 mg Intravenous Given 10/05/21 1532)  sodium chloride 0.9 % bolus 1,000 mL (1,000 mLs Intravenous New Bag/Given 10/05/21 1622)    ED Course/ Medical Decision Making/ A&P  Medical Decision Making This patient presents to the ED for concern of abdominal pain, this involves a  number of treatment options, and is a complaint that carries with it a moderate risk of complications and morbidity.  The differential diagnosis includes gallbladder pathology, peptic ulcer disease, pancreatitis, and aortic dissection.  Considered aortic dissection but vitals are stable with symmetric pulses and patient denies pertinent risk factors.  Ultrasound confirmed cholelithiasis.  In the setting of elevated liver enzymes and presentation, choledocholithiasis cannot be ruled out.   Cholelithiasis: Ordered and reviewed ultrasound.  Ordered pertinent labs.  Ordered morphine for pain.  Zofran for nausea.  Ordered 1 L bolus of normal saline.  Consulting GI who recommended admission for further evaluation and ongoing management.  Co morbidities: Discussed in HPI   EMR reviewed including pt PMHx, past surgical history and past visits to ER.   See HPI for more details   Lab Tests:   I ordered and independently interpreted labs. Labs notable for elevated liver enzymes, hyperglycemia, and leukocytosis.    Imaging Studies:  Abnormal findings. I personally reviewed all imaging studies. Imaging notable for cholelithiasis.     Cardiac Monitoring:  The patient was maintained on a cardiac monitor.  I personally viewed and interpreted the cardiac monitored which showed an underlying rhythm of: sinus tachycardia    Medicines ordered:  I ordered medication including morphine  for pain, zofran for nausea, fluids for rehydration. Reevaluation of the patient after these medicines showed that the patient improved I have reviewed the patients home medicines and have made adjustments as needed    Consults/Attending Physician   I requested consultation with internal medicine  and discussed lab and imaging findings as well as pertinent plan - they recommend: admission to the hospital for ongoing management for cholelithiasis.    Reevaluation:  After the interventions noted above I  re-evaluated patient and found that they have :improved     ED Course: Patient presented with abdominal pain.  Ultrasound revealed cholelithiasis.  In the setting of elevated liver enzymes there is concern for choledocholithiasis prompting admission with internal medicine service for further investigation ongoing management.  Treated pain with morphine.  Treated nausea with Zofran.  Elevated hemoglobin on CBC and tachycardia, concern for  dehydration.  Treated with a liter bolus of normal saline.  Admitted to the hospital.   Dispostion:  After consideration of the diagnostic results and the patients response to treatment, I feel that the patent would benefit from admission to the hospital for further evaluation and ongoing management of her cholelithiasis and possible choledocholithiasis.         Final Clinical Impression(s) / ED Diagnoses Final diagnoses:  Elevated LFTs  Upper abdominal pain    Rx / DC Orders ED Discharge Orders     None         Gareth Eagle, PA-C 10/05/21 1636    Jacalyn Lefevre, MD 10/06/21 1125

## 2021-10-05 NOTE — ED Triage Notes (Signed)
Patient sent to ED from PCP for evaluation of abdominal pain and elevated liver enzyme on blood work drawn today. Patient report vomiting and diarrhea. Pain started Wednesday this week. Patient is alert, oriented, and in no apparent distress at this time.

## 2021-10-05 NOTE — ED Notes (Signed)
Ultrasound at bedside

## 2021-10-05 NOTE — Progress Notes (Signed)
Patient reported recently diagnosed superficial clot of left leg and started on Eliquis 2.5 mg twice daily last month.  Will not resume Eliquis given possibility of surgical intervention, patient right now is on equivalent dose of Lovenox for DVT prophylaxis.

## 2021-10-05 NOTE — ED Provider Notes (Signed)
  Physical Exam  BP 128/89   Pulse (!) 103   Temp 99.3 F (37.4 C) (Oral)   Resp 16   SpO2 94%   ED Course / MDM    Accepted handoff at shift change from [facility]. Please see prior provider note for full HPI.  Briefly: Patient is 71 year old female who presents to the ER for abdominal pain.  DDX/Plan: Plan at time of shift change was to consult hospitalist for admission. GI already consulted by previous team who recommended medicine admission and they will follow on the floor before deciding on MRCP.   Consulted with hospitalist Dr Chipper Herb who will admit.    Jeanella Flattery 10/05/21 1641    Pricilla Loveless, MD 10/08/21 1505

## 2021-10-06 DIAGNOSIS — K8050 Calculus of bile duct without cholangitis or cholecystitis without obstruction: Secondary | ICD-10-CM | POA: Diagnosis not present

## 2021-10-06 LAB — COMPREHENSIVE METABOLIC PANEL
ALT: 197 U/L — ABNORMAL HIGH (ref 0–44)
AST: 61 U/L — ABNORMAL HIGH (ref 15–41)
Albumin: 3.1 g/dL — ABNORMAL LOW (ref 3.5–5.0)
Alkaline Phosphatase: 112 U/L (ref 38–126)
Anion gap: 4 — ABNORMAL LOW (ref 5–15)
BUN: 8 mg/dL (ref 8–23)
CO2: 27 mmol/L (ref 22–32)
Calcium: 8.7 mg/dL — ABNORMAL LOW (ref 8.9–10.3)
Chloride: 110 mmol/L (ref 98–111)
Creatinine, Ser: 1.02 mg/dL — ABNORMAL HIGH (ref 0.44–1.00)
GFR, Estimated: 59 mL/min — ABNORMAL LOW (ref >60)
Glucose, Bld: 124 mg/dL — ABNORMAL HIGH (ref 70–99)
Potassium: 3.9 mmol/L (ref 3.5–5.1)
Sodium: 141 mmol/L (ref 135–145)
Total Bilirubin: 1.1 mg/dL (ref 0.3–1.2)
Total Protein: 5.8 g/dL — ABNORMAL LOW (ref 6.5–8.1)

## 2021-10-06 LAB — CBC
HCT: 44 % (ref 36.0–46.0)
Hemoglobin: 14.5 g/dL (ref 12.0–15.0)
MCH: 31.3 pg (ref 26.0–34.0)
MCHC: 33 g/dL (ref 30.0–36.0)
MCV: 95 fL (ref 80.0–100.0)
Platelets: 271 10*3/uL (ref 150–400)
RBC: 4.63 MIL/uL (ref 3.87–5.11)
RDW: 12 % (ref 11.5–15.5)
WBC: 6.4 10*3/uL (ref 4.0–10.5)
nRBC: 0 % (ref 0.0–0.2)

## 2021-10-06 LAB — LIPASE, BLOOD: Lipase: 210 U/L — ABNORMAL HIGH (ref 11–51)

## 2021-10-06 LAB — HIV ANTIBODY (ROUTINE TESTING W REFLEX): HIV Screen 4th Generation wRfx: NONREACTIVE

## 2021-10-06 MED ORDER — DESVENLAFAXINE SUCCINATE ER 50 MG PO TB24
150.0000 mg | ORAL_TABLET | Freq: Every day | ORAL | Status: DC
  Administered 2021-10-06 – 2021-10-07 (×2): 150 mg via ORAL
  Filled 2021-10-06 (×3): qty 1

## 2021-10-06 MED ORDER — LEMBOREXANT 5 MG PO TABS
5.0000 mg | ORAL_TABLET | Freq: Every day | ORAL | Status: DC
  Administered 2021-10-06 – 2021-10-07 (×2): 5 mg via ORAL

## 2021-10-06 MED ORDER — SODIUM CHLORIDE 0.9 % IV SOLN
2.0000 g | INTRAVENOUS | Status: AC
  Administered 2021-10-07: 2 g via INTRAVENOUS
  Filled 2021-10-06: qty 2

## 2021-10-06 NOTE — Progress Notes (Addendum)
PROGRESS NOTE    Debbie Schultz  ZWC:[id number] DOB: 07/13/1950 DOA: 10/05/2021 PCP: Renford Dills, MD   Brief Narrative:  Debbie Schultz is a 71 y.o. female with medical history significant of anxiety/depression, hypothyroidism, presented with acute onset intractable mid abdominal pain.  Ultrasound in the ED shows cholelithiasis without choledocholithiasis, given elevated labs concern for passed gallstone given negative imaging.  Tentative plan for cholecystectomy per surgery.   Assessment & Plan:   Principal Problem:   Choledocholithiasis Active Problems:   Acute gallstone pancreatitis  Acute choledocholithiasis with associated pancreatitis, resolving -GI consulted - appreciate insight/recs -General surgery following for cholecystectomy in the next 24 hours  -Imaging remarkable for cholelithiasis without obstruction or common bile duct dilatation  -likely representing a passed stone, labs improving clinically feels back to baseline.    AKI, resolving  -Secondary to above, poor p.o. intake, resolved with IV fluids and increase p.o. intake  Leukocytosis, reactive, resolved -Follow clinically - likely reactive secondary to above - without fever or source of infection   Hypothyroidism -Stable   Anxiety/depression -Stable, outpatient follow-up as scheduled   DVT prophylaxis: Lovenox Code Status: Full code Family Communication: None present  Status is: Inpatient  Dispo: The patient is from: Home              Anticipated d/c is to: Home              Anticipated d/c date is: 24 to 48 hours              Patient currently not medically stable for discharge  Consultants:  General surgery, GI  Procedures:  Plan cholecystectomy 10/07/2021  Antimicrobials:  None  Subjective: No acute issues or events overnight tolerating p.o. well no further episodes of nausea vomiting abdominal pain.  Denies headache fever chills chest pain shortness of breath diarrhea  constipation.  Objective: Vitals:   10/05/21 1450 10/05/21 1719 10/05/21 1801 10/05/21 1935  BP:  134/75 132/84 (!) 144/77  Pulse: (!) 103 99 (!) 101 (!) 101  Resp:  16 16   Temp:  98.8 F (37.1 C) 99.5 F (37.5 C) 98.2 F (36.8 C)  TempSrc:  Oral Oral Oral  SpO2: 94% 97% 95% 94%  Weight:   77.2 kg   Height:   5\' 2"  (1.575 m)     Intake/Output Summary (Last 24 hours) at 10/06/2021 0733 Last data filed at 10/05/2021 2000 Gross per 24 hour  Intake 1240 ml  Output --  Net 1240 ml   Filed Weights   10/05/21 1801  Weight: 77.2 kg    Examination:  General:  Pleasantly resting in bed, No acute distress. HEENT:  Normocephalic atraumatic.  Sclerae nonicteric, noninjected.  Extraocular movements intact bilaterally. Neck:  Without mass or deformity.  Trachea is midline. Lungs:  Clear to auscultate bilaterally without rhonchi, wheeze, or rales. Heart:  Regular rate and rhythm.  Without murmurs, rubs, or gallops. Abdomen:  Soft, nontender, nondistended.  Without guarding or rebound. Extremities: Without cyanosis, clubbing, edema, or obvious deformity. Vascular:  Dorsalis pedis and posterior tibial pulses palpable bilaterally. Skin:  Warm and dry, no erythema, no ulcerations.   Data Reviewed: I have personally reviewed following labs and imaging studies  CBC: Recent Labs  Lab 10/05/21 1359 10/06/21 0253  WBC 11.4* 6.4  HGB 16.9* 14.5  HCT 50.6* 44.0  MCV 93.4 95.0  PLT 408* 271   Basic Metabolic Panel: Recent Labs  Lab 10/05/21 1359 10/06/21 0253  NA 137 141  K 3.9  3.9  CL 106 110  CO2 23 27  GLUCOSE 162* 124*  BUN 14 8  CREATININE 1.23* 1.02*  CALCIUM 9.3 8.7*   GFR: Estimated Creatinine Clearance: 49.3 mL/min (A) (by C-G formula based on SCr of 1.02 mg/dL (H)). Liver Function Tests: Recent Labs  Lab 10/05/21 1359 10/06/21 0253  AST 129* 61*  ALT 308* 197*  ALKPHOS 159* 112  BILITOT 1.8* 1.1  PROT 7.5 5.8*  ALBUMIN 4.0 3.1*   Recent Labs  Lab  10/05/21 1359 10/06/21 0253  LIPASE 2,650* 210*   No results for input(s): "AMMONIA" in the last 168 hours. Coagulation Profile: No results for input(s): "INR", "PROTIME" in the last 168 hours. Cardiac Enzymes: No results for input(s): "CKTOTAL", "CKMB", "CKMBINDEX", "TROPONINI" in the last 168 hours. BNP (last 3 results) No results for input(s): "PROBNP" in the last 8760 hours. HbA1C: No results for input(s): "HGBA1C" in the last 72 hours. CBG: No results for input(s): "GLUCAP" in the last 168 hours. Lipid Profile: No results for input(s): "CHOL", "HDL", "LDLCALC", "TRIG", "CHOLHDL", "LDLDIRECT" in the last 72 hours. Thyroid Function Tests: No results for input(s): "TSH", "T4TOTAL", "FREET4", "T3FREE", "THYROIDAB" in the last 72 hours. Anemia Panel: No results for input(s): "VITAMINB12", "FOLATE", "FERRITIN", "TIBC", "IRON", "RETICCTPCT" in the last 72 hours. Sepsis Labs: No results for input(s): "PROCALCITON", "LATICACIDVEN" in the last 168 hours.  No results found for this or any previous visit (from the past 240 hour(s)).       Radiology Studies: CT ABDOMEN W CONTRAST  Result Date: 10/05/2021 CLINICAL DATA:  Acute abdominal pain. Vomiting and diarrhea. Elevated liver enzymes. Cholelithiasis. Right renal calculus. EXAM: CT ABDOMEN WITH CONTRAST TECHNIQUE: Multidetector CT imaging of the abdomen was performed using the standard protocol following bolus administration of intravenous contrast. RADIATION DOSE REDUCTION: This exam was performed according to the departmental dose-optimization program which includes automated exposure control, adjustment of the mA and/or kV according to patient size and/or use of iterative reconstruction technique. CONTRAST:  77mL OMNIPAQUE IOHEXOL 350 MG/ML SOLN COMPARISON:  None Available. FINDINGS: Lower chest: No acute findings. Hepatobiliary: No hepatic masses identified. No evidence of steatosis. A few gallstones are seen, however there is no  evidence of cholecystitis or biliary dilatation. Pancreas:  No mass or inflammatory changes. Spleen:  Within normal limits in size and appearance. Adrenals/Urinary Tract: No masses identified. A few small benign-appearing renal cysts are noted (no followup imaging recommended). No evidence of hydronephrosis. Stomach/Bowel: Unremarkable. Vascular/Lymphatic: No pathologically enlarged lymph nodes identified. No acute vascular findings. Aortic atherosclerotic calcification incidentally noted. Other:  None. Musculoskeletal:  No suspicious bone lesions identified. IMPRESSION: Cholelithiasis. No radiographic evidence of cholecystitis or other acute findings. Aortic Atherosclerosis (ICD10-I70.0). Electronically Signed   By: Danae Orleans M.D.   On: 10/05/2021 18:00   US Abdomen Limited RUQ (LIVER/GB)  Result Date: 10/05/2021 CLINICAL DATA:  Right lower quadrant pain EXAM: ULTRASOUND ABDOMEN LIMITED RIGHT UPPER QUADRANT COMPARISON:  None Available. FINDINGS: Gallbladder: Shadowing gallstones are seen measuring up to 1.6 cm. There is no gallbladder wall thickening or pericholecystic fluid. Sonographic Murphy's sign was reported negative. Common bile duct: Diameter: 8 mm.  There is no intrahepatic biliary ductal dilatation. Liver: No focal lesion identified. Within normal limits in parenchymal echogenicity. Portal vein is patent on color Doppler imaging with normal direction of blood flow towards the liver. Other: An echogenic focus in the right kidney measuring 9 mm likely reflects a renal stone. There is no hydronephrosis. IMPRESSION: 1. Cholelithiasis without evidence of acute cholecystitis.  2. 9 mm nonobstructing right renal stone.  No hydronephrosis. Electronically Signed   By: Lesia Hausen M.D.   On: 10/05/2021 14:50    Scheduled Meds:  aspirin EC  81 mg Oral Daily   enoxaparin (LOVENOX) injection  40 mg Subcutaneous Q24H   fluticasone  1 spray Each Nare Daily   liothyronine  10 mcg Oral Daily   propranolol   10 mg Oral BID   traZODone  200 mg Oral QHS   venlafaxine XR  150 mg Oral Q breakfast   Continuous Infusions:  sodium chloride 125 mL/hr at 10/05/21 1809   [START ON 10/07/2021] cefoTEtan (CEFOTAN) IV      LOS: 1 day   Time spent:  Azucena Fallen, DO [facility]  If 7PM-7AM, please contact night-coverage [email or web address]  10/06/2021, 7:33 AM

## 2021-10-06 NOTE — Consult Note (Signed)
CC: upper abdominal pain  Requesting provider: Dr Chipper Herb  HPI: Debbie Schultz is an 71 y.o. female who is here for for abnormal liver function test seen on outpatient labs because of acute onset of abdominal pain.  She states that she started having some intermittent upper abdominal pain on Tuesday of this week.  It was associated with nausea and vomiting and then dry heaves.  Because she kept having these intermittent episodes of upper abdominal pain she ended up going to see her PCP who did blood work and she was called and told that her liver enzymes were elevated and go to the emergency room.  She had no fever or chills.  Her last bowel movement was a few days ago she thinks on Tuesday.  She normally has a bowel movement at least every other day.  She states that she really has not eaten anything over the past few days because of the abdominal pain and she has not really felt hungry.  No prior symptoms.  No prior abdominal surgery other than laparoscopy.  No dysuria or hematuria.  No melena or hematochezia.  No hematemesis.  Infrequent NSAID use.  She states that she had a blood clot in her leg in June and is on Eliquis.  She states that her initial ultrasound showed a blood clot in her saphenous vein but then she saw a vein specialist and was told that her blood clot was more extensive and was placed on Eliquis.  She states her last dose of Eliquis was on Thursday.  She and her sister were scheduled to leave this Tuesday on a long road trip up to [place] until October  Past Medical History:  Diagnosis Date   Anxiety    Arthritis    bilateral knees   Back pain    Depression    Dysrhythmia    "high heart rate".  seen last Dec 2018   Headache    resolved   Hypothyroid    Insomnia     Past Surgical History:  Procedure Laterality Date   BLADDER SURGERY     BUNIONECTOMY     CATARACT EXTRACTION, BILATERAL     CYST EXCISION Left 03/18/2017   Procedure: EXCISION OF VOLAR CYST OF LEFT  WRIST;  Surgeon: Cindee Salt, MD;  Location: [facility];  Service: Orthopedics;  Laterality: Left;   KNEE ARTHROSCOPY     REPLACEMENT TOTAL KNEE Right 2008   REPLACEMENT TOTAL KNEE Left 2012   TOE SURGERY     TONSILLECTOMY      Family History  Problem Relation Age of Onset   Heart disease Mother        PPM   Heart attack Father    Diabetes Maternal Aunt    Stroke Paternal Grandmother    Headache Neg Hx     Social:  reports that she has never smoked. She has never used smokeless tobacco. She reports current alcohol use. She reports that she does not use drugs.  Allergies:  Allergies  Allergen Reactions   Symbicort [Budesonide-Formoterol Fumarate] Nausea Only   Bupropion Rash   Sulfamethoxazole Rash    Medications: I have reviewed the patient's current medications.   ROS - all of the below systems have been reviewed with the patient and positives are indicated with bold text General: chills, fever or night sweats Eyes: blurry vision or double vision ENT: epistaxis or sore throat Allergy/Immunology: itchy/watery eyes or nasal congestion Hematologic/Lymphatic: bleeding problems, blood clots or swollen lymph nodes;  see hpi Endocrine: temperature intolerance or unexpected weight changes Breast: new or changing breast lumps or nipple discharge Resp: cough, shortness of breath, or wheezing CV: chest pain or dyspnea on exertion GI: as per HPI GU: dysuria, trouble voiding, or hematuria MSK: joint pain or joint stiffness Neuro: TIA or stroke symptoms Derm: pruritus and skin lesion changes Psych: anxiety and depression  PE Blood pressure (!) 144/77, pulse (!) 101, temperature 98.2 F (36.8 C), temperature source Oral, resp. rate 16, height 5\' 2"  (1.575 m), weight 77.2 kg, SpO2 94 %. Constitutional: NAD; conversant; no deformities Eyes: Moist conjunctiva; no lid lag; anicteric; PERRL Neck: Trachea midline; no thyromegaly Lungs: Normal respiratory effort; no  tactile fremitus CV: RRR; no palpable thrills; no pitting edema GI: Abd soft, very min TTP in upper abd, def no rebound/guarding; no palpable hepatosplenomegaly MSK: Normal gait; no clubbing/cyanosis Psychiatric: Appropriate affect; alert and oriented x3 Lymphatic: No palpable cervical or axillary lymphadenopathy Skin:no rash/lesions/jaundice  Results for orders placed or performed during the hospital encounter of 10/05/21 (from the past 48 hour(s))  Urinalysis, Routine w reflex microscopic Urine, Clean Catch     Status: Abnormal   Collection Time: 10/05/21  1:55 PM  Result Value Ref Range   Color, Urine YELLOW YELLOW   APPearance CLEAR CLEAR   Specific Gravity, Urine >1.030 (H) 1.005 - 1.030   pH 5.5 5.0 - 8.0   Glucose, UA NEGATIVE NEGATIVE mg/dL   Hgb urine dipstick NEGATIVE NEGATIVE   Bilirubin Urine MODERATE (A) NEGATIVE   Ketones, ur 15 (A) NEGATIVE mg/dL   Protein, ur 30 (A) NEGATIVE mg/dL   Nitrite NEGATIVE NEGATIVE   Leukocytes,Ua NEGATIVE NEGATIVE    Comment: Performed at [facility], [address] [place], [place]  Urinalysis, Microscopic (reflex)     Status: Abnormal   Collection Time: 10/05/21  1:55 PM  Result Value Ref Range   RBC / HPF 0-5 0 - 5 RBC/hpf   WBC, UA 0-5 0 - 5 WBC/hpf   Bacteria, UA RARE (A) NONE SEEN   Squamous Epithelial / LPF 0-5 0 - 5   Mucus PRESENT    Ca Oxalate Crys, UA PRESENT     Comment: Performed at [facility], [address], [place], [place]  Lipase, blood     Status: Abnormal   Collection Time: 10/05/21  1:59 PM  Result Value Ref Range   Lipase 2,650 (H) 11 - 51 U/L    Comment: RESULTS CONFIRMED BY MANUAL DILUTION Performed at [facility], [address] [place], [place]   Comprehensive metabolic panel     Status: Abnormal   Collection Time: 10/05/21  1:59 PM  Result Value Ref Range   Sodium 137 135 - 145 mmol/L   Potassium 3.9 3.5 - 5.1 mmol/L   Chloride 106 98 - 111 mmol/L    CO2 23 22 - 32 mmol/L   Glucose, Bld 162 (H) 70 - 99 mg/dL    Comment: Glucose reference range applies only to samples taken after fasting for at least 8 hours.   BUN 14 8 - 23 mg/dL   Creatinine, Ser 10/07/21 (H) 0.44 - 1.00 mg/dL   Calcium 9.3 8.9 - 2.13 mg/dL   Total Protein 7.5 6.5 - 8.1 g/dL   Albumin 4.0 3.5 - 5.0 g/dL   AST 08.6 (H) 15 - 41 U/L   ALT 308 (H) 0 - 44 U/L   Alkaline Phosphatase 159 (H) 38 -  126 U/L   Total Bilirubin 1.8 (H) 0.3 - 1.2 mg/dL   GFR, Estimated 47 (L) >60 mL/min    Comment: (NOTE) Calculated using the CKD-EPI Creatinine Equation (2021)    Anion gap 8 5 - 15    Comment: Performed at [facility], [address] [place], [place] [address]  CBC     Status: Abnormal   Collection Time: 10/05/21  1:59 PM  Result Value Ref Range   WBC 11.4 (H) 4.0 - 10.5 K/uL   RBC 5.42 (H) 3.87 - 5.11 MIL/uL   Hemoglobin 16.9 (H) 12.0 - 15.0 g/dL   HCT 24.0 (H) 97.3 - 53.2 %   MCV 93.4 80.0 - 100.0 fL   MCH 31.2 26.0 - 34.0 pg   MCHC 33.4 30.0 - 36.0 g/dL   RDW 99.2 42.6 - 83.4 %   Platelets 408 (H) 150 - 400 K/uL   nRBC 0.0 0.0 - 0.2 %    Comment: Performed at [facility], [address], [place], [place] [address]  CBC     Status: None   Collection Time: 10/06/21  2:53 AM  Result Value Ref Range   WBC 6.4 4.0 - 10.5 K/uL   RBC 4.63 3.87 - 5.11 MIL/uL   Hemoglobin 14.5 12.0 - 15.0 g/dL   HCT 29.7 98.9 - 21.1 %   MCV 95.0 80.0 - 100.0 fL   MCH 31.3 26.0 - 34.0 pg   MCHC 33.0 30.0 - 36.0 g/dL   RDW 94.1 74.0 - 81.4 %   Platelets 271 150 - 400 K/uL   nRBC 0.0 0.0 - 0.2 %    Comment: Performed at [facility], [address], [place], [place] [address]  Comprehensive metabolic panel     Status: Abnormal   Collection Time: 10/06/21  2:53 AM  Result Value Ref Range   Sodium 141 135 - 145 mmol/L   Potassium 3.9 3.5 - 5.1 mmol/L   Chloride 110 98 - 111 mmol/L   CO2 27 22 - 32 mmol/L   Glucose, Bld 124 (H) 70 - 99 mg/dL    Comment: Glucose  reference range applies only to samples taken after fasting for at least 8 hours.   BUN 8 8 - 23 mg/dL   Creatinine, Ser 6.31 (H) 0.44 - 1.00 mg/dL   Calcium 8.7 (L) 8.9 - 10.3 mg/dL   Total Protein 5.8 (L) 6.5 - 8.1 g/dL   Albumin 3.1 (L) 3.5 - 5.0 g/dL   AST 61 (H) 15 - 41 U/L   ALT 197 (H) 0 - 44 U/L   Alkaline Phosphatase 112 38 - 126 U/L   Total Bilirubin 1.1 0.3 - 1.2 mg/dL   GFR, Estimated 59 (L) >60 mL/min    Comment: (NOTE) Calculated using the CKD-EPI Creatinine Equation (2021)    Anion gap 4 (L) 5 - 15    Comment: Performed at [facility] Lab, [address] [place], [place] [address]  Lipase, blood     Status: Abnormal   Collection Time: 10/06/21  2:53 AM  Result Value Ref Range   Lipase 210 (H) 11 - 51 U/L    Comment: Performed at [facility], [address] [place], [place] [address]    CT ABDOMEN W CONTRAST  Result Date: 10/05/2021 CLINICAL DATA:  Acute abdominal pain. Vomiting and diarrhea. Elevated liver enzymes. Cholelithiasis. Right renal calculus. EXAM: CT ABDOMEN WITH CONTRAST TECHNIQUE: Multidetector CT imaging of the abdomen  was performed using the standard protocol following bolus administration of intravenous contrast. RADIATION DOSE REDUCTION: This exam was performed according to the departmental dose-optimization program which includes automated exposure control, adjustment of the mA and/or kV according to patient size and/or use of iterative reconstruction technique. CONTRAST:  65mL OMNIPAQUE IOHEXOL 350 MG/ML SOLN COMPARISON:  None Available. FINDINGS: Lower chest: No acute findings. Hepatobiliary: No hepatic masses identified. No evidence of steatosis. A few gallstones are seen, however there is no evidence of cholecystitis or biliary dilatation. Pancreas:  No mass or inflammatory changes. Spleen:  Within normal limits in size and appearance. Adrenals/Urinary Tract: No masses identified. A few small benign-appearing renal cysts are noted (no followup  imaging recommended). No evidence of hydronephrosis. Stomach/Bowel: Unremarkable. Vascular/Lymphatic: No pathologically enlarged lymph nodes identified. No acute vascular findings. Aortic atherosclerotic calcification incidentally noted. Other:  None. Musculoskeletal:  No suspicious bone lesions identified. IMPRESSION: Cholelithiasis. No radiographic evidence of cholecystitis or other acute findings. Aortic Atherosclerosis (ICD10-I70.0). Electronically Signed   By: Danae OrleansJohn A Stahl M.D.   On: 10/05/2021 18:00   US Abdomen Limited RUQ (LIVER/GB)  Result Date: 10/05/2021 CLINICAL DATA:  Right lower quadrant pain EXAM: ULTRASOUND ABDOMEN LIMITED RIGHT UPPER QUADRANT COMPARISON:  None Available. FINDINGS: Gallbladder: Shadowing gallstones are seen measuring up to 1.6 cm. There is no gallbladder wall thickening or pericholecystic fluid. Sonographic Murphy's sign was reported negative. Common bile duct: Diameter: 8 mm.  There is no intrahepatic biliary ductal dilatation. Liver: No focal lesion identified. Within normal limits in parenchymal echogenicity. Portal vein is patent on color Doppler imaging with normal direction of blood flow towards the liver. Other: An echogenic focus in the right kidney measuring 9 mm likely reflects a renal stone. There is no hydronephrosis. IMPRESSION: 1. Cholelithiasis without evidence of acute cholecystitis. 2. 9 mm nonobstructing right renal stone.  No hydronephrosis. Electronically Signed   By: Lesia HausenPeter  Noone M.D.   On: 10/05/2021 14:50    Imaging: personally  A/P: Federico FlakeKathryn Schultz is an 71 y.o. 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 H/o DVT Hypothyroidism Anxiety/depression 9 mm nonobstructing right renal stone  I think her symptoms are consistent with gallstone pancreatitis.  There is no evidence of cholecystitis on either imaging modality.  No radiological evidence of pancreatitis just chemical evidence.  Her abdominal pain has  essentially resolved.  Therefore I think we can proceed with laparoscopic cholecystectomy this admission since it appears her pancreatitis is self-limited.  Questionable history of actual deep venous thrombosis.  We did discuss how we would manage her perioperatively regarding her history of questionable DVT.  Continue to hold Eliquis.  We will be able to resume Eliquis on discharge.  We did discuss that if she truly has had fact a true DVT and she has a slight increased risk for recurrent DVT/PE perioperatively  Nonetheless keep the patient on chemical VTE prophylaxis like Lovenox.  Patient may have her dose this evening  We will tentatively plan laparoscopic cholecystectomy with cholangiogram on Sunday with Dr. Sheliah HatchKinsinger N.p.o. after midnight except meds and ice chips  Patient may have diet today  Using a diagram I described the anatomy of the hepatobiliary system as well as discussed gallstone pancreatitis.  I discussed steps of laparoscopic cholecystectomy.  Discussed some of the risk of surgery as well as the aftercare.  I reviewed her ER records, labs, vitals, GI consult note, her lower extremity duplex study and vitals for the past 24 hours  Mary SellaEric M. Andrey CampanileWilson, MD, FACS  General, Bariatric, & Minimally Invasive Surgery [facility]

## 2021-10-06 NOTE — Progress Notes (Signed)
Pt informed of POC, NPO at 12am, procedure for am, all needs addressed

## 2021-10-06 NOTE — Progress Notes (Signed)
Subjective: Patient reports that abdominal pain has completely resolved. She denies nausea or vomiting. She reports being planned for cholecystectomy tomorrow.  Objective: Vital signs in last 24 hours: Temp:  [98.2 F (36.8 C)-99.5 F (37.5 C)] 98.7 F (37.1 C) (07/29 0802) Pulse Rate:  [78-117] 78 (07/29 0802) Resp:  [16-18] 18 (07/29 0802) BP: (110-148)/(66-97) 110/66 (07/29 0802) SpO2:  [92 %-97 %] 93 % (07/29 0802) Weight:  [77.2 kg] 77.2 kg (07/28 1801) Weight change:  Last BM Date : 10/02/21  PE: Not in distress GENERAL: No pallor, no icterus  ABDOMEN: Soft, nondistended, nontender, normoactive bowel sounds EXTREMITIES: No deformity, no edema  Lab Results: Results for orders placed or performed during the hospital encounter of 10/05/21 (from the past 48 hour(s))  Urinalysis, Routine w reflex microscopic Urine, Clean Catch     Status: Abnormal   Collection Time: 10/05/21  1:55 PM  Result Value Ref Range   Color, Urine YELLOW YELLOW   APPearance CLEAR CLEAR   Specific Gravity, Urine >1.030 (H) 1.005 - 1.030   pH 5.5 5.0 - 8.0   Glucose, UA NEGATIVE NEGATIVE mg/dL   Hgb urine dipstick NEGATIVE NEGATIVE   Bilirubin Urine MODERATE (A) NEGATIVE   Ketones, ur 15 (A) NEGATIVE mg/dL   Protein, ur 30 (A) NEGATIVE mg/dL   Nitrite NEGATIVE NEGATIVE   Leukocytes,Ua NEGATIVE NEGATIVE    Comment: Performed at [facility], [address] [place], [place] [address]  Urinalysis, Microscopic (reflex)     Status: Abnormal   Collection Time: 10/05/21  1:55 PM  Result Value Ref Range   RBC / HPF 0-5 0 - 5 RBC/hpf   WBC, UA 0-5 0 - 5 WBC/hpf   Bacteria, UA RARE (A) NONE SEEN   Squamous Epithelial / LPF 0-5 0 - 5   Mucus PRESENT    Ca Oxalate Crys, UA PRESENT     Comment: Performed at [facility], [address], [place], [place] [address]  Lipase, blood     Status: Abnormal   Collection Time: 10/05/21  1:59 PM  Result Value Ref Range   Lipase 2,650 (H) 11 - 51 U/L     Comment: RESULTS CONFIRMED BY MANUAL DILUTION Performed at [facility], [address], [place], [place] [address]   Comprehensive metabolic panel     Status: Abnormal   Collection Time: 10/05/21  1:59 PM  Result Value Ref Range   Sodium 137 135 - 145 mmol/L   Potassium 3.9 3.5 - 5.1 mmol/L   Chloride 106 98 - 111 mmol/L   CO2 23 22 - 32 mmol/L   Glucose, Bld 162 (H) 70 - 99 mg/dL    Comment: Glucose reference range applies only to samples taken after fasting for at least 8 hours.   BUN 14 8 - 23 mg/dL   Creatinine, Ser 9.14 (H) 0.44 - 1.00 mg/dL   Calcium 9.3 8.9 - 78.2 mg/dL   Total Protein 7.5 6.5 - 8.1 g/dL   Albumin 4.0 3.5 - 5.0 g/dL   AST 956 (H) 15 - 41 U/L   ALT 308 (H) 0 - 44 U/L   Alkaline Phosphatase 159 (H) 38 - 126 U/L   Total Bilirubin 1.8 (H) 0.3 - 1.2 mg/dL   GFR, Estimated 47 (L) >60 mL/min    Comment: (NOTE) Calculated using the CKD-EPI Creatinine Equation (2021)    Anion gap 8 5 - 15    Comment: Performed at [facility], [address] [place], [place] [address]  CBC     Status: Abnormal   Collection Time: 10/05/21  1:59 PM  Result Value Ref Range   WBC 11.4 (H) 4.0 - 10.5 K/uL   RBC 5.42 (H) 3.87 - 5.11 MIL/uL   Hemoglobin 16.9 (H) 12.0 - 15.0 g/dL   HCT 30.1 (H) 60.1 - 09.3 %   MCV 93.4 80.0 - 100.0 fL   MCH 31.2 26.0 - 34.0 pg   MCHC 33.4 30.0 - 36.0 g/dL   RDW 23.5 57.3 - 22.0 %   Platelets 408 (H) 150 - 400 K/uL   nRBC 0.0 0.0 - 0.2 %    Comment: Performed at [facility], [address], [place], [place] [address]  CBC     Status: None   Collection Time: 10/06/21  2:53 AM  Result Value Ref Range   WBC 6.4 4.0 - 10.5 K/uL   RBC 4.63 3.87 - 5.11 MIL/uL   Hemoglobin 14.5 12.0 - 15.0 g/dL   HCT 06.2 37.6 - 28.3 %   MCV 95.0 80.0 - 100.0 fL   MCH 31.3 26.0 - 34.0 pg   MCHC 33.0 30.0 - 36.0 g/dL   RDW 15.1 76.1 - 60.7 %   Platelets 271 150 - 400 K/uL   nRBC 0.0 0.0 - 0.2 %    Comment: Performed at [facility]  Lab, [address] [place], [place] [address]  Comprehensive metabolic panel     Status: Abnormal   Collection Time: 10/06/21  2:53 AM  Result Value Ref Range   Sodium 141 135 - 145 mmol/L   Potassium 3.9 3.5 - 5.1 mmol/L   Chloride 110 98 - 111 mmol/L   CO2 27 22 - 32 mmol/L   Glucose, Bld 124 (H) 70 - 99 mg/dL    Comment: Glucose reference range applies only to samples taken after fasting for at least 8 hours.   BUN 8 8 - 23 mg/dL   Creatinine, Ser 2.69 (H) 0.44 - 1.00 mg/dL   Calcium 8.7 (L) 8.9 - 10.3 mg/dL   Total Protein 5.8 (L) 6.5 - 8.1 g/dL   Albumin 3.1 (L) 3.5 - 5.0 g/dL   AST 61 (H) 15 - 41 U/L   ALT 197 (H) 0 - 44 U/L   Alkaline Phosphatase 112 38 - 126 U/L   Total Bilirubin 1.1 0.3 - 1.2 mg/dL   GFR, Estimated 59 (L) >60 mL/min    Comment: (NOTE) Calculated using the CKD-EPI Creatinine Equation (2021)    Anion gap 4 (L) 5 - 15    Comment: Performed at [facility] Lab, [address] [place], [place] [address]  Lipase, blood     Status: Abnormal   Collection Time: 10/06/21  2:53 AM  Result Value Ref Range   Lipase 210 (H) 11 - 51 U/L    Comment: Performed at [facility], [address], [place], [place] [address]    Studies/Results: CT ABDOMEN W CONTRAST  Result Date: 10/05/2021 CLINICAL DATA:  Acute abdominal pain. Vomiting and diarrhea. Elevated liver enzymes. Cholelithiasis. Right renal calculus. EXAM: CT ABDOMEN WITH CONTRAST TECHNIQUE: Multidetector CT imaging of the abdomen was performed using the standard protocol following bolus administration of intravenous contrast. RADIATION DOSE REDUCTION: This exam was performed according to the departmental dose-optimization program which includes automated exposure control, adjustment of the mA and/or kV according to patient size and/or use of iterative reconstruction technique. CONTRAST:  80mL OMNIPAQUE IOHEXOL  350 MG/ML SOLN COMPARISON:  None Available. FINDINGS: Lower chest: No acute findings. Hepatobiliary:  No hepatic masses identified. No evidence of steatosis. A few gallstones are seen, however there is no evidence of cholecystitis or biliary dilatation. Pancreas:  No mass or inflammatory changes. Spleen:  Within normal limits in size and appearance. Adrenals/Urinary Tract: No masses identified. A few small benign-appearing renal cysts are noted (no followup imaging recommended). No evidence of hydronephrosis. Stomach/Bowel: Unremarkable. Vascular/Lymphatic: No pathologically enlarged lymph nodes identified. No acute vascular findings. Aortic atherosclerotic calcification incidentally noted. Other:  None. Musculoskeletal:  No suspicious bone lesions identified. IMPRESSION: Cholelithiasis. No radiographic evidence of cholecystitis or other acute findings. Aortic Atherosclerosis (ICD10-I70.0). Electronically Signed   By: Danae Orleans M.D.   On: 10/05/2021 18:00   US Abdomen Limited RUQ (LIVER/GB)  Result Date: 10/05/2021 CLINICAL DATA:  Right lower quadrant pain EXAM: ULTRASOUND ABDOMEN LIMITED RIGHT UPPER QUADRANT COMPARISON:  None Available. FINDINGS: Gallbladder: Shadowing gallstones are seen measuring up to 1.6 cm. There is no gallbladder wall thickening or pericholecystic fluid. Sonographic Murphy's sign was reported negative. Common bile duct: Diameter: 8 mm.  There is no intrahepatic biliary ductal dilatation. Liver: No focal lesion identified. Within normal limits in parenchymal echogenicity. Portal vein is patent on color Doppler imaging with normal direction of blood flow towards the liver. Other: An echogenic focus in the right kidney measuring 9 mm likely reflects a renal stone. There is no hydronephrosis. IMPRESSION: 1. Cholelithiasis without evidence of acute cholecystitis. 2. 9 mm nonobstructing right renal stone.  No hydronephrosis. Electronically Signed   By: Lesia Hausen M.D.   On: 10/05/2021 14:50    Medications: I have reviewed the patient's current medications.  Assessment: Abdominal pain,  elevated lipase, gallstones, clinical picture compatible with gallstone pancreatitis  CBD normal at 8 mm, no intrahepatic or extrahepatic biliary ductal dilatation  LFTs trending down, T. bili 1.1/AST 61/ALT 197/ALP 112  Plan: Okay to start low-fat diet from GI standpoint. Cholecystectomy being planned for tomorrow. GI will sign off, please recall if needed.  Kerin Salen, MD 10/06/2021, 8:29 AM

## 2021-10-07 ENCOUNTER — Inpatient Hospital Stay (HOSPITAL_COMMUNITY): Payer: Medicare Other | Admitting: Anesthesiology

## 2021-10-07 ENCOUNTER — Inpatient Hospital Stay (HOSPITAL_COMMUNITY): Payer: Medicare Other

## 2021-10-07 ENCOUNTER — Encounter (HOSPITAL_COMMUNITY)
Admission: EM | Disposition: A | Discharge status: Home / Self Care | Payer: Self-pay | Source: Home / Self Care | Attending: Internal Medicine

## 2021-10-07 ENCOUNTER — Encounter (HOSPITAL_COMMUNITY): Payer: Self-pay | Admitting: Internal Medicine

## 2021-10-07 ENCOUNTER — Other Ambulatory Visit: Payer: Self-pay

## 2021-10-07 DIAGNOSIS — M1990 Unspecified osteoarthritis, unspecified site: Secondary | ICD-10-CM

## 2021-10-07 DIAGNOSIS — K8050 Calculus of bile duct without cholangitis or cholecystitis without obstruction: Secondary | ICD-10-CM

## 2021-10-07 DIAGNOSIS — F418 Other specified anxiety disorders: Secondary | ICD-10-CM

## 2021-10-07 DIAGNOSIS — I10 Essential (primary) hypertension: Secondary | ICD-10-CM

## 2021-10-07 HISTORY — PX: CHOLECYSTECTOMY: SHX55

## 2021-10-07 LAB — COMPREHENSIVE METABOLIC PANEL
ALT: 143 U/L — ABNORMAL HIGH (ref 0–44)
AST: 36 U/L (ref 15–41)
Albumin: 3.2 g/dL — ABNORMAL LOW (ref 3.5–5.0)
Alkaline Phosphatase: 112 U/L (ref 38–126)
Anion gap: 5 (ref 5–15)
BUN: 7 mg/dL — ABNORMAL LOW (ref 8–23)
CO2: 28 mmol/L (ref 22–32)
Calcium: 9 mg/dL (ref 8.9–10.3)
Chloride: 107 mmol/L (ref 98–111)
Creatinine, Ser: 0.89 mg/dL (ref 0.44–1.00)
GFR, Estimated: >60 mL/min (ref >60)
Glucose, Bld: 122 mg/dL — ABNORMAL HIGH (ref 70–99)
Potassium: 4.1 mmol/L (ref 3.5–5.1)
Sodium: 140 mmol/L (ref 135–145)
Total Bilirubin: 1 mg/dL (ref 0.3–1.2)
Total Protein: 5.9 g/dL — ABNORMAL LOW (ref 6.5–8.1)

## 2021-10-07 LAB — LIPASE, BLOOD: Lipase: 39 U/L (ref 11–51)

## 2021-10-07 SURGERY — LAPAROSCOPIC CHOLECYSTECTOMY WITH INTRAOPERATIVE CHOLANGIOGRAM
Anesthesia: General | Site: Abdomen

## 2021-10-07 MED ORDER — BUPIVACAINE HCL (PF) 0.25 % IJ SOLN
INTRAMUSCULAR | Status: AC
Start: 2021-10-07 — End: ?
  Filled 2021-10-07: qty 30

## 2021-10-07 MED ORDER — FENTANYL CITRATE (PF) 250 MCG/5ML IJ SOLN
INTRAMUSCULAR | Status: AC
  Filled 2021-10-07: qty 5

## 2021-10-07 MED ORDER — ONDANSETRON HCL 4 MG/2ML IJ SOLN
INTRAMUSCULAR | Status: AC
  Filled 2021-10-07: qty 2

## 2021-10-07 MED ORDER — PROPOFOL 10 MG/ML IV BOLUS
INTRAVENOUS | Status: DC | PRN
  Administered 2021-10-07: 170 mg via INTRAVENOUS

## 2021-10-07 MED ORDER — DEXAMETHASONE SODIUM PHOSPHATE 10 MG/ML IJ SOLN
INTRAMUSCULAR | Status: DC | PRN
  Administered 2021-10-07: 5 mg via INTRAVENOUS

## 2021-10-07 MED ORDER — SUGAMMADEX SODIUM 200 MG/2ML IV SOLN
INTRAVENOUS | Status: DC | PRN
  Administered 2021-10-07: 200 mg via INTRAVENOUS

## 2021-10-07 MED ORDER — ACETAMINOPHEN 500 MG PO TABS
ORAL_TABLET | ORAL | Status: AC
  Administered 2021-10-07: 1000 mg via ORAL
  Filled 2021-10-07: qty 2

## 2021-10-07 MED ORDER — OXYCODONE HCL 5 MG/5ML PO SOLN: 5.0000 mg | Freq: Once | ORAL | Status: DC | PRN

## 2021-10-07 MED ORDER — ONDANSETRON HCL 4 MG/2ML IJ SOLN
INTRAMUSCULAR | Status: DC | PRN
  Administered 2021-10-07: 4 mg via INTRAVENOUS

## 2021-10-07 MED ORDER — 0.9 % SODIUM CHLORIDE (POUR BTL) OPTIME
TOPICAL | Status: DC | PRN
  Administered 2021-10-07: 1000 mL

## 2021-10-07 MED ORDER — ORAL CARE MOUTH RINSE: 15.0000 mL | Freq: Once | OROMUCOSAL | Status: AC

## 2021-10-07 MED ORDER — BUPIVACAINE HCL 0.25 % IJ SOLN
INTRAMUSCULAR | Status: DC | PRN
  Administered 2021-10-07: 30 mL

## 2021-10-07 MED ORDER — SODIUM CHLORIDE 0.9 % IR SOLN
Status: DC | PRN
  Administered 2021-10-07: 1000 mL

## 2021-10-07 MED ORDER — PROPOFOL 10 MG/ML IV BOLUS
INTRAVENOUS | Status: AC
  Filled 2021-10-07: qty 20

## 2021-10-07 MED ORDER — LIDOCAINE 2% (20 MG/ML) 5 ML SYRINGE
INTRAMUSCULAR | Status: AC
  Filled 2021-10-07: qty 5

## 2021-10-07 MED ORDER — SODIUM CHLORIDE 0.9 % IV SOLN: INTRAVENOUS | Status: DC | PRN

## 2021-10-07 MED ORDER — FENTANYL CITRATE (PF) 250 MCG/5ML IJ SOLN
INTRAMUSCULAR | Status: DC | PRN
  Administered 2021-10-07: 150 ug via INTRAVENOUS
  Administered 2021-10-07 (×2): 50 ug via INTRAVENOUS

## 2021-10-07 MED ORDER — MIDAZOLAM HCL 2 MG/2ML IJ SOLN: 0.5000 mg | Freq: Once | INTRAMUSCULAR | Status: DC | PRN

## 2021-10-07 MED ORDER — SENNOSIDES-DOCUSATE SODIUM 8.6-50 MG PO TABS
1.0000 | ORAL_TABLET | Freq: Every evening | ORAL | Status: DC | PRN
Start: 2021-10-07 — End: 2021-10-08
  Administered 2021-10-07: 1 via ORAL
  Filled 2021-10-07: qty 1

## 2021-10-07 MED ORDER — ACETAMINOPHEN 500 MG PO TABS
1000.0000 mg | ORAL_TABLET | Freq: Once | ORAL | Status: AC
Start: 2021-10-07 — End: 2021-10-07

## 2021-10-07 MED ORDER — DEXAMETHASONE SODIUM PHOSPHATE 10 MG/ML IJ SOLN
INTRAMUSCULAR | Status: AC
  Filled 2021-10-07: qty 1

## 2021-10-07 MED ORDER — SUCCINYLCHOLINE 20MG/ML (10ML) SYRINGE FOR MEDFUSION PUMP - OPTIME
INTRAMUSCULAR | Status: DC | PRN
  Administered 2021-10-07: 10 mg via INTRAVENOUS

## 2021-10-07 MED ORDER — LACTATED RINGERS IV SOLN: INTRAVENOUS | Status: DC

## 2021-10-07 MED ORDER — OXYCODONE HCL 5 MG PO TABS: 5.0000 mg | ORAL_TABLET | Freq: Once | ORAL | Status: DC | PRN

## 2021-10-07 MED ORDER — MEPERIDINE HCL 25 MG/ML IJ SOLN: 6.2500 mg | INTRAMUSCULAR | Status: DC | PRN

## 2021-10-07 MED ORDER — PROMETHAZINE HCL 25 MG/ML IJ SOLN: 6.2500 mg | INTRAMUSCULAR | Status: DC | PRN

## 2021-10-07 MED ORDER — HYDROMORPHONE HCL 1 MG/ML IJ SOLN: 0.2500 mg | INTRAMUSCULAR | Status: DC | PRN

## 2021-10-07 MED ORDER — POLYETHYLENE GLYCOL 3350 17 G PO PACK: 17.0000 g | PACK | Freq: Every day | ORAL | Status: DC | PRN

## 2021-10-07 MED ORDER — ROCURONIUM BROMIDE 10 MG/ML (PF) SYRINGE
PREFILLED_SYRINGE | INTRAVENOUS | Status: DC | PRN
  Administered 2021-10-07: 50 mg via INTRAVENOUS

## 2021-10-07 MED ORDER — LIDOCAINE 2% (20 MG/ML) 5 ML SYRINGE
INTRAMUSCULAR | Status: DC | PRN
  Administered 2021-10-07: 20 mg via INTRAVENOUS

## 2021-10-07 MED ORDER — OXYCODONE HCL 5 MG PO TABS
5.0000 mg | ORAL_TABLET | Freq: Four times a day (QID) | ORAL | Status: DC | PRN
  Administered 2021-10-07 – 2021-10-08 (×2): 5 mg via ORAL
  Filled 2021-10-07 (×2): qty 1

## 2021-10-07 MED ORDER — CHLORHEXIDINE GLUCONATE 0.12 % MT SOLN
15.0000 mL | Freq: Once | OROMUCOSAL | Status: AC
  Administered 2021-10-07: 15 mL via OROMUCOSAL
  Filled 2021-10-07: qty 15

## 2021-10-07 SURGICAL SUPPLY — 43 items
APPLIER CLIP 5 13 M/L LIGAMAX5 (MISCELLANEOUS) ×2
APPLIER CLIP ROT 10 11.4 M/L (STAPLE)
BAG COUNTER SPONGE SURGICOUNT (BAG) ×2 IMPLANT
BLADE CLIPPER SURG (BLADE) IMPLANT
CANISTER SUCT 3000ML PPV (MISCELLANEOUS) ×2 IMPLANT
CATH CHOLANG 76X19 KUMAR (CATHETERS) IMPLANT
CHLORAPREP W/TINT 26 (MISCELLANEOUS) ×2 IMPLANT
CLIP APPLIE 5 13 M/L LIGAMAX5 (MISCELLANEOUS) IMPLANT
CLIP APPLIE ROT 10 11.4 M/L (STAPLE) IMPLANT
CLIP LIGATING HEMO O LOK GREEN (MISCELLANEOUS) IMPLANT
COVER MAYO STAND STRL (DRAPES) ×2 IMPLANT
COVER SURGICAL LIGHT HANDLE (MISCELLANEOUS) ×2 IMPLANT
DERMABOND ADVANCED (GAUZE/BANDAGES/DRESSINGS) ×1
DERMABOND ADVANCED .7 DNX12 (GAUZE/BANDAGES/DRESSINGS) ×1 IMPLANT
DRAPE C-ARM 42X120 X-RAY (DRAPES) ×2 IMPLANT
ELECT REM PT RETURN 9FT ADLT (ELECTROSURGICAL) ×2
ELECTRODE REM PT RTRN 9FT ADLT (ELECTROSURGICAL) ×1 IMPLANT
GLOVE BIOGEL PI IND STRL 7.0 (GLOVE) ×1 IMPLANT
GLOVE BIOGEL PI INDICATOR 7.0 (GLOVE) ×1
GLOVE SURG SS PI 7.0 STRL IVOR (GLOVE) ×2 IMPLANT
GOWN STRL REUS W/ TWL LRG LVL3 (GOWN DISPOSABLE) ×3 IMPLANT
GOWN STRL REUS W/TWL LRG LVL3 (GOWN DISPOSABLE) ×3
GRASPER SUT TROCAR 14GX15 (MISCELLANEOUS) ×3 IMPLANT
KIT BASIN OR (CUSTOM PROCEDURE TRAY) ×2 IMPLANT
KIT TURNOVER KIT B (KITS) ×2 IMPLANT
NEEDLE 22X1 1/2 (OR ONLY) (NEEDLE) ×2 IMPLANT
NS IRRIG 1000ML POUR BTL (IV SOLUTION) ×2 IMPLANT
PAD ARMBOARD 7.5X6 YLW CONV (MISCELLANEOUS) ×2 IMPLANT
POUCH RETRIEVAL ECOSAC 10 (ENDOMECHANICALS) ×1 IMPLANT
POUCH RETRIEVAL ECOSAC 10MM (ENDOMECHANICALS) ×1
SCISSORS LAP 5X35 DISP (ENDOMECHANICALS) ×2 IMPLANT
SET CHOLANGIOGRAPH 5 50 .035 (SET/KITS/TRAYS/PACK) ×2 IMPLANT
SET IRRIG TUBING LAPAROSCOPIC (IRRIGATION / IRRIGATOR) ×2 IMPLANT
SET TUBE SMOKE EVAC HIGH FLOW (TUBING) ×2 IMPLANT
SLEEVE ENDOPATH XCEL 5M (ENDOMECHANICALS) ×4 IMPLANT
STOPCOCK 4 WAY LG BORE MALE ST (IV SETS) IMPLANT
SUT MNCRL AB 4-0 PS2 18 (SUTURE) ×2 IMPLANT
TOWEL GREEN STERILE (TOWEL DISPOSABLE) ×2 IMPLANT
TOWEL GREEN STERILE FF (TOWEL DISPOSABLE) ×2 IMPLANT
TRAY LAPAROSCOPIC MC (CUSTOM PROCEDURE TRAY) ×2 IMPLANT
TROCAR XCEL 12X100 BLDLESS (ENDOMECHANICALS) ×2 IMPLANT
TROCAR Z-THREAD OPTICAL 5X100M (TROCAR) ×2 IMPLANT
WATER STERILE IRR 1000ML POUR (IV SOLUTION) ×2 IMPLANT

## 2021-10-07 NOTE — Progress Notes (Signed)
PROGRESS NOTE    Debbie Schultz  HYQ:[id number] DOB: June 25, 1950 DOA: 10/05/2021 PCP: Renford Dills, MD   Brief Narrative:  Debbie Schultz is a 71 y.o. female with medical history significant of anxiety/depression, hypothyroidism, presented with acute onset intractable mid abdominal pain.  Ultrasound in the ED shows cholelithiasis without choledocholithiasis, given elevated labs concern for passed gallstone given negative imaging.  Tentative plan for cholecystectomy per surgery.   Assessment & Plan:   Principal Problem:   Choledocholithiasis Active Problems:   Acute gallstone pancreatitis  Acute choledocholithiasis with associated pancreatitis, resolving - GI consulted - appreciate insight/recs - General surgery following -lap chole planned this morning - Imaging remarkable for cholelithiasis without obstruction or common bile duct dilatation  -likely representing a passed stone, labs improving clinically feels back to baseline.    AKI, resolving  - Secondary to above, poor p.o. intake, resolved with IV fluids and increase p.o. intake  Leukocytosis, reactive, resolved - Follow clinically - likely reactive secondary to above - without fever or source of infection   Hypothyroidism - Stable   Anxiety/depression - Stable, outpatient follow-up as scheduled   DVT prophylaxis: Lovenox Code Status: Full code Family Communication: None present  Status is: Inpatient  Dispo: The patient is from: Home              Anticipated d/c is to: Home              Anticipated d/c date is: 24 to 48 hours              Patient currently not medically stable for discharge  Consultants:  General surgery, GI  Procedures:  Cholecystectomy 10/07/2021  Antimicrobials:  None  Subjective: No acute issues or events overnight tolerating p.o. well no further episodes of nausea vomiting abdominal pain.  Denies headache fever chills chest pain shortness of breath diarrhea  constipation.  Objective: Vitals:   10/05/21 1801 10/05/21 1935 10/06/21 0802 10/06/21 2047  BP: 132/84 (!) 144/77 110/66 134/73  Pulse: (!) 101 (!) 101 78 91  Resp: 16  18 18   Temp: 99.5 F (37.5 C) 98.2 F (36.8 C) 98.7 F (37.1 C) 97.8 F (36.6 C)  TempSrc: Oral Oral Oral Oral  SpO2: 95% 94% 93% 94%  Weight: 77.2 kg     Height: 5\' 2"  (1.575 m)       Intake/Output Summary (Last 24 hours) at 10/07/2021 0741 Last data filed at 10/06/2021 1559 Gross per 24 hour  Intake 1506.81 ml  Output --  Net 1506.81 ml    Filed Weights   10/05/21 1801  Weight: 77.2 kg    Examination:  General:  Pleasantly resting in bed, No acute distress. HEENT:  Normocephalic atraumatic.  Sclerae nonicteric, noninjected.  Extraocular movements intact bilaterally. Neck:  Without mass or deformity.  Trachea is midline. Lungs:  Clear to auscultate bilaterally without rhonchi, wheeze, or rales. Heart:  Regular rate and rhythm.  Without murmurs, rubs, or gallops. Abdomen:  Soft, nontender, nondistended.  Without guarding or rebound. Extremities: Without cyanosis, clubbing, edema, or obvious deformity. Vascular:  Dorsalis pedis and posterior tibial pulses palpable bilaterally. Skin:  Warm and dry, no erythema, no ulcerations.   Data Reviewed: I have personally reviewed following labs and imaging studies  CBC: Recent Labs  Lab 10/05/21 1359 10/06/21 0253  WBC 11.4* 6.4  HGB 16.9* 14.5  HCT 50.6* 44.0  MCV 93.4 95.0  PLT 408* 271    Basic Metabolic Panel: Recent Labs  Lab 10/05/21 1359 10/06/21 0253  10/07/21 0536  NA 137 141 140  K 3.9 3.9 4.1  CL 106 110 107  CO2 23 27 28   GLUCOSE 162* 124* 122*  BUN 14 8 7*  CREATININE 1.23* 1.02* 0.89  CALCIUM 9.3 8.7* 9.0    GFR: Estimated Creatinine Clearance: 56.5 mL/min (by C-G formula based on SCr of 0.89 mg/dL). Liver Function Tests: Recent Labs  Lab 10/05/21 1359 10/06/21 0253 10/07/21 0536  AST 129* 61* 36  ALT 308* 197* 143*   ALKPHOS 159* 112 112  BILITOT 1.8* 1.1 1.0  PROT 7.5 5.8* 5.9*  ALBUMIN 4.0 3.1* 3.2*    Recent Labs  Lab 10/05/21 1359 10/06/21 0253 10/07/21 0536  LIPASE 2,650* 210* 39    No results for input(s): "AMMONIA" in the last 168 hours. Coagulation Profile: No results for input(s): "INR", "PROTIME" in the last 168 hours. Cardiac Enzymes: No results for input(s): "CKTOTAL", "CKMB", "CKMBINDEX", "TROPONINI" in the last 168 hours. BNP (last 3 results) No results for input(s): "PROBNP" in the last 8760 hours. HbA1C: No results for input(s): "HGBA1C" in the last 72 hours. CBG: No results for input(s): "GLUCAP" in the last 168 hours. Lipid Profile: No results for input(s): "CHOL", "HDL", "LDLCALC", "TRIG", "CHOLHDL", "LDLDIRECT" in the last 72 hours. Thyroid Function Tests: No results for input(s): "TSH", "T4TOTAL", "FREET4", "T3FREE", "THYROIDAB" in the last 72 hours. Anemia Panel: No results for input(s): "VITAMINB12", "FOLATE", "FERRITIN", "TIBC", "IRON", "RETICCTPCT" in the last 72 hours. Sepsis Labs: No results for input(s): "PROCALCITON", "LATICACIDVEN" in the last 168 hours.  No results found for this or any previous visit (from the past 240 hour(s)).       Radiology Studies: CT ABDOMEN W CONTRAST  Result Date: 10/05/2021 CLINICAL DATA:  Acute abdominal pain. Vomiting and diarrhea. Elevated liver enzymes. Cholelithiasis. Right renal calculus. EXAM: CT ABDOMEN WITH CONTRAST TECHNIQUE: Multidetector CT imaging of the abdomen was performed using the standard protocol following bolus administration of intravenous contrast. RADIATION DOSE REDUCTION: This exam was performed according to the departmental dose-optimization program which includes automated exposure control, adjustment of the mA and/or kV according to patient size and/or use of iterative reconstruction technique. CONTRAST:  60mL OMNIPAQUE IOHEXOL 350 MG/ML SOLN COMPARISON:  None Available. FINDINGS: Lower chest: No  acute findings. Hepatobiliary: No hepatic masses identified. No evidence of steatosis. A few gallstones are seen, however there is no evidence of cholecystitis or biliary dilatation. Pancreas:  No mass or inflammatory changes. Spleen:  Within normal limits in size and appearance. Adrenals/Urinary Tract: No masses identified. A few small benign-appearing renal cysts are noted (no followup imaging recommended). No evidence of hydronephrosis. Stomach/Bowel: Unremarkable. Vascular/Lymphatic: No pathologically enlarged lymph nodes identified. No acute vascular findings. Aortic atherosclerotic calcification incidentally noted. Other:  None. Musculoskeletal:  No suspicious bone lesions identified. IMPRESSION: Cholelithiasis. No radiographic evidence of cholecystitis or other acute findings. Aortic Atherosclerosis (ICD10-I70.0). Electronically Signed   By: 77m M.D.   On: 10/05/2021 18:00   10/07/2021 Abdomen Limited RUQ (LIVER/GB)  Result Date: 10/05/2021 CLINICAL DATA:  Right lower quadrant pain EXAM: ULTRASOUND ABDOMEN LIMITED RIGHT UPPER QUADRANT COMPARISON:  None Available. FINDINGS: Gallbladder: Shadowing gallstones are seen measuring up to 1.6 cm. There is no gallbladder wall thickening or pericholecystic fluid. Sonographic Murphy's sign was reported negative. Common bile duct: Diameter: 8 mm.  There is no intrahepatic biliary ductal dilatation. Liver: No focal lesion identified. Within normal limits in parenchymal echogenicity. Portal vein is patent on color Doppler imaging with normal direction of blood flow towards the  liver. Other: An echogenic focus in the right kidney measuring 9 mm likely reflects a renal stone. There is no hydronephrosis. IMPRESSION: 1. Cholelithiasis without evidence of acute cholecystitis. 2. 9 mm nonobstructing right renal stone.  No hydronephrosis. Electronically Signed   By: Lesia Hausen M.D.   On: 10/05/2021 14:50    Scheduled Meds:  aspirin EC  81 mg Oral Daily   desvenlafaxine   150 mg Oral QHS   enoxaparin (LOVENOX) injection  40 mg Subcutaneous Q24H   fluticasone  1 spray Each Nare Daily   Lemborexant  5 mg Oral QHS   liothyronine  10 mcg Oral Daily   propranolol  10 mg Oral BID   traZODone  200 mg Oral QHS   Continuous Infusions:  cefoTEtan (CEFOTAN) IV      LOS: 2 days   Time spent:  Azucena Fallen, DO [facility]  If 7PM-7AM, please contact night-coverage [email or web address]  10/07/2021, 7:41 AM

## 2021-10-07 NOTE — Op Note (Signed)
PATIENT:  Debbie Schultz  71 y.o. female  PRE-OPERATIVE DIAGNOSIS:  CHOLEOCHOLITHIASIS  POST-OPERATIVE DIAGNOSIS:  CHOLEOCHOLITHIASIS  PROCEDURE:  Procedure(s): LAPAROSCOPIC CHOLECYSTECTOMY WITH INTRAOPERATIVE CHOLANGIOGRAM   SURGEON:  Nana Hoselton, De Blanch, MD   ASSISTANT: P.J. Carolynne Edouard, M.D.  ANESTHESIA:   local and general  Indications for procedure: [name] is a 71 y.o. female with symptoms of Abdominal pain and Nausea and vomiting consistent with gallbladder disease, Confirmed by ultrasound.  Description of procedure: The patient was brought into the operative suite, placed supine. Anesthesia was administered with endotracheal tube. Patient was strapped in place and foot board was secured. All pressure points were offloaded by foam padding. The patient was prepped and draped in the usual sterile fashion.  A periumbilical incision was made and optical entry was used to enter the abdomen. 2 5 mm trocars were placed on in the right lateral space on in the right subcostal space. A 48mm trocar was placed in the subxiphoid space. Marcaine was infused to the subxiphoid space and lateral upper right abdomen in the transversus abdominis plane. Next the patient was placed in reverse trendelenberg. The gallbladder appearedfull of stones and chronically inflamed. Omentum was adhered to the gallbladder and was taken down with cautery/blunt dissection.  The gallbladder was retracted cephalad and lateral. The peritoneum was reflected off the infundibulum working lateral to medial. The cystic duct and cystic artery were identified and further dissection revealed a critical view, due to concern for choledocholithiasis a cholangiogram was performed with ductotomy and cook catheter passed through a separate subcostal stab incision. Initially there was 1 lucency that disappeared on second run. There was a large peri-ampullary diverticulum. The cystic duct and cystic artery were doubly clipped and  ligated.   The gallbladder was removed off the liver bed with cautery. The Gallbladder was placed in a specimen bag. The gallbladder fossa was irrigated and hemostasis was applied with cautery. The gallbladder was removed via the 63mm trocar. The fascial defect was closed with interrupted 0 vicryl suture via laparoscopic trans-fascial suture passer and loose stones were removed. Pneumoperitoneum was removed, all trocar were removed. All incisions were closed with 4-0 monocryl subcuticular stitch. The patient woke from anesthesia and was brought to PACU in stable condition. All counts were correct  Findings: chronic cholecystitis, duodenal diverticulum  Specimen: gallbladder  Blood loss: 20 ml  Local anesthesia: 30 ml Marcaine  Complications: none  PLAN OF CARE: Admit to inpatient   PATIENT DISPOSITION:  PACU - hemodynamically stable.  De Blanch [facility], [place]

## 2021-10-07 NOTE — Transfer of Care (Signed)
Immediate Anesthesia Transfer of Care Note  Patient: Debbie Schultz  Procedure(s) Performed: LAPAROSCOPIC CHOLECYSTECTOMY WITH INTRAOPERATIVE CHOLANGIOGRAM (Abdomen)  Patient Location: PACU  Anesthesia Type:General  Level of Consciousness: awake, alert  and oriented  Airway & Oxygen Therapy: Patient Spontanous Breathing and Patient connected to face mask oxygen  Post-op Assessment: Report given to RN, Post -op Vital signs reviewed and stable and Patient moving all extremities X 4  Post vital signs: Reviewed and stable  Last Vitals:  Vitals Value Taken Time  BP 133/78 10/07/21 1130  Temp    Pulse 74 10/07/21 1132  Resp 12 10/07/21 1132  SpO2 97 % 10/07/21 1132  Vitals shown include unvalidated device data.  Last Pain:  Vitals:   10/07/21 0758  TempSrc: Oral  PainSc: 0-No pain         Complications: No notable events documented.

## 2021-10-07 NOTE — Anesthesia Procedure Notes (Signed)
Procedure Name: Intubation Date/Time: 10/07/2021 10:11 AM  Performed by: Rande Brunt, CRNAPre-anesthesia Checklist: Patient identified, Emergency Drugs available, Suction available and Patient being monitored Patient Re-evaluated:Patient Re-evaluated prior to induction Oxygen Delivery Method: Circle System Utilized Preoxygenation: Pre-oxygenation with 100% oxygen Induction Type: IV induction, Rapid sequence and Cricoid Pressure applied Laryngoscope Size: Mac and 3 Grade View: Grade I Tube type: Oral Tube size: 7.0 mm Number of attempts: 1 Airway Equipment and Method: Stylet and Oral airway Placement Confirmation: ETT inserted through vocal cords under direct vision, positive ETCO2 and breath sounds checked- equal and bilateral Secured at: 21 cm Tube secured with: Tape Dental Injury: Teeth and Oropharynx as per pre-operative assessment

## 2021-10-07 NOTE — Anesthesia Postprocedure Evaluation (Signed)
Anesthesia Post Note  Patient: Debbie Schultz  Procedure(s) Performed: LAPAROSCOPIC CHOLECYSTECTOMY WITH INTRAOPERATIVE CHOLANGIOGRAM (Abdomen)     Patient location during evaluation: PACU Anesthesia Type: General Level of consciousness: awake and alert, patient cooperative and oriented Pain management: pain level controlled (pain improving) Vital Signs Assessment: post-procedure vital signs reviewed and stable Respiratory status: spontaneous breathing, nonlabored ventilation and respiratory function stable Cardiovascular status: blood pressure returned to baseline and stable Postop Assessment: no apparent nausea or vomiting Anesthetic complications: no   No notable events documented.  Last Vitals:  Vitals:   10/07/21 1200 10/07/21 1211  BP: 131/72 133/71  Pulse: 74 73  Resp: 14 16  Temp: 36.8 C 36.4 C  SpO2: 96% 96%    Last Pain:  Vitals:   10/07/21 1236  TempSrc:   PainSc: 8                  Varnell Orvis,E. [place] Marney

## 2021-10-07 NOTE — Progress Notes (Signed)
Report given to OR.. informed procedure time is being adjusted, OR anti to be held until the new call with new time frame, will pass along to dayshift nurse, pt informed of new POC

## 2021-10-07 NOTE — Anesthesia Preprocedure Evaluation (Addendum)
Anesthesia Evaluation  Patient identified by MRN, date of birth, ID band Patient awake    Reviewed: Allergy & Precautions, NPO status , Patient's Chart, lab work & pertinent test results, reviewed documented beta blocker date and time   History of Anesthesia Complications Negative for: history of anesthetic complications  Airway Mallampati: I  TM Distance: >3 FB Neck ROM: Full    Dental  (+) Dental Advisory Given, Teeth Intact   Pulmonary neg pulmonary ROS,    breath sounds clear to auscultation       Cardiovascular hypertension, Pt. on home beta blockers and Pt. on medications (-) angina+ DVT  + dysrhythmias  Rhythm:Regular Rate:Normal     Neuro/Psych  Headaches, Anxiety Depression    GI/Hepatic Neg liver ROS, GERD  Medicated and Controlled,CBD stone   Endo/Other  Hypothyroidism Morbid obesity  Renal/GU negative Renal ROS     Musculoskeletal  (+) Arthritis ,   Abdominal (+) + obese,   Peds  Hematology  (+) Blood dyscrasia (eliquis), ,   Anesthesia Other Findings   Reproductive/Obstetrics                            Anesthesia Physical Anesthesia Plan  ASA: 3  Anesthesia Plan: General   Post-op Pain Management: Tylenol PO (pre-op)*   Induction: Intravenous and Rapid sequence  PONV Risk Score and Plan: 3 and Ondansetron, Dexamethasone and Treatment may vary due to age or medical condition  Airway Management Planned: Oral ETT  Additional Equipment: None  Intra-op Plan:   Post-operative Plan: Extubation in OR  Informed Consent: I have reviewed the patients History and Physical, chart, labs and discussed the procedure including the risks, benefits and alternatives for the proposed anesthesia with the patient or authorized representative who has indicated his/her understanding and acceptance.     Dental advisory given  Plan Discussed with: CRNA and Surgeon  Anesthesia Plan  Comments:        Anesthesia Quick Evaluation

## 2021-10-07 NOTE — Progress Notes (Signed)
Pre Procedure note for inpatients:   Debbie Schultz has been scheduled for Procedure(s): LAPAROSCOPIC CHOLECYSTECTOMY WITH INTRAOPERATIVE CHOLANGIOGRAM (N/A) today. The various methods of treatment have been discussed with the patient. After consideration of the risks, benefits and treatment options the patient has consented to the planned procedure.   The patient has been seen and labs reviewed. There are no changes in the patient's condition to prevent proceeding with the planned procedure today.  Recent labs:  Lab Results  Component Value Date   WBC 6.4 10/06/2021   HGB 14.5 10/06/2021   HCT 44.0 10/06/2021   PLT 271 10/06/2021   GLUCOSE 122 (H) 10/07/2021   ALT 143 (H) 10/07/2021   AST 36 10/07/2021   NA 140 10/07/2021   K 4.1 10/07/2021   CL 107 10/07/2021   CREATININE 0.89 10/07/2021   BUN 7 (L) 10/07/2021   CO2 28 10/07/2021    Rodman Pickle, MD 10/07/2021 9:29 AM

## 2021-10-08 ENCOUNTER — Encounter (HOSPITAL_COMMUNITY): Payer: Self-pay | Admitting: General Surgery

## 2021-10-08 DIAGNOSIS — K8050 Calculus of bile duct without cholangitis or cholecystitis without obstruction: Secondary | ICD-10-CM | POA: Diagnosis not present

## 2021-10-08 LAB — LIPASE, BLOOD: Lipase: 30 U/L (ref 11–51)

## 2021-10-08 MED ORDER — ACETAMINOPHEN 500 MG PO TABS: 1000.0000 mg | ORAL_TABLET | Freq: Four times a day (QID) | ORAL | 2 refills | Status: DC | PRN

## 2021-10-08 MED ORDER — OXYCODONE HCL 5 MG PO TABS: 5.0000 mg | ORAL_TABLET | Freq: Four times a day (QID) | ORAL | 0 refills | Status: DC | PRN

## 2021-10-08 NOTE — Telephone Encounter (Signed)
Called patient and she is in the hospital, had her gallbladder removed, will probably not be traveling until 8/15.  She had medications available from her last 90-day refill and surgeon has meds pended. Will wait until she is discharged to see if she will still be traveling for 3 mo and what medications she will need.

## 2021-10-08 NOTE — Telephone Encounter (Signed)
LVM to [facility] 

## 2021-10-08 NOTE — Discharge Summary (Signed)
Physician Discharge Summary  Debbie Schultz AJG:[id number] DOB: 07/07/1950 DOA: 10/05/2021  PCP: Renford Dills, MD  Admit date: 10/05/2021 Discharge date: 10/08/2021  Admitted From: Home Disposition: Home  Recommendations for Outpatient Follow-up:  Follow up with PCP in 1-2 weeks Follow-up with general surgery in 2 weeks as scheduled  Home Health: None Equipment/Devices: None  Discharge Condition: Stable CODE STATUS: Full Diet recommendation: As tolerated  Brief/Interim Summary: Debbie Schultz is a 71 y.o. female with medical history significant of anxiety/depression, hypothyroidism, presented with acute onset intractable mid abdominal pain.  Ultrasound in the ED shows cholelithiasis without choledocholithiasis, given elevated labs concern for passed gallstone given negative imaging.  Tentative plan for cholecystectomy per surgery.  Patient admitted as above with acute choledocholithiasis, concurrent pancreatitis which rapidly improved after passage of stone.  No further need for GI intervention.  Given patient has multiple cholelithiasis in the setting of recent choledocholithiasis General surgery was consulted recommending cholecystectomy.  Patient tolerated well, otherwise stable and agreeable for discharge home, labs have normalized, patient tolerating p.o. well ambulating without difficulty.  Lengthy discussion at bedside about activity restrictions as she was previously planning to travel to go rockclimbing surgery was recommended patient limit physical activity for 6 weeks per guidelines.  At postop evaluation with surgery would recommend further discussion of recommendations and goals so that patient may resume her trip planning.   Discharge Diagnoses:  Principal Problem:   Choledocholithiasis Active Problems:   Acute gallstone pancreatitis  Acute choledocholithiasis with associated pancreatitis, resolving   AKI, resolved    Leukocytosis, reactive, resolved - Sepsis ruled  out   Hypothyroidism   Anxiety/depression  Stable, outpatient follow-up as scheduled  Discharge Instructions   Allergies as of 10/08/2021       Reactions   Other Nausea And Vomiting   lobster   Symbicort [budesonide-formoterol Fumarate] Nausea Only   Bupropion Rash   Sulfamethoxazole Rash        Medication List     TAKE these medications    acetaminophen 500 MG tablet Commonly known as: TYLENOL Take 2 tablets (1,000 mg total) by mouth every 6 (six) hours as needed.   DayVigo 5 MG Tabs Generic drug: Lemborexant Take 5 mg by mouth at bedtime.   Deplin 15 15-90.314 MG Caps Take 15 mg by mouth daily.   desvenlafaxine 100 MG 24 hr tablet Commonly known as: PRISTIQ TAKE ONE TABLET BY MOUTH DAILY ALONG WITH 50 MILLIGRAM TABLET TO EQUAL TOTAL DOSE OF 150 MILLIGRAMS   desvenlafaxine 50 MG 24 hr tablet Commonly known as: PRISTIQ TAKE ONE TABLET BY MOUTH DAILY, TAKE WITH 100MG  TABLET TO EQUAL 150MG  DAILY   Eliquis 2.5 MG Tabs tablet Generic drug: apixaban Take 2.5 mg by mouth 2 (two) times daily.   fluticasone 50 MCG/ACT nasal spray Commonly known as: FLONASE Place 1 spray into both nostrils daily.   liothyronine 5 MCG tablet Commonly known as: CYTOMEL Take 5 mcg by mouth daily.   OMEGA-3 FATTY ACIDS PO Take by mouth.   ondansetron 4 MG tablet Commonly known as: ZOFRAN Take 4 mg by mouth every 8 (eight) hours as needed for nausea or vomiting.   oxyCODONE 5 MG immediate release tablet Commonly known as: Oxy IR/ROXICODONE Take 1 tablet (5 mg total) by mouth every 6 (six) hours as needed for moderate pain (not relieved by tylenol).   pantoprazole 40 MG tablet Commonly known as: PROTONIX Take 40 mg by mouth daily.   propranolol 10 MG tablet Commonly known as: INDERAL Take 1  tablet (10 mg total) by mouth 2 (two) times daily. Take 10 mg by mouth 2 (two) times daily.   traZODone 100 MG tablet Commonly known as: DESYREL TAKE 1 TO 2 TABLETS BY MOUTH AT BEDTIME  FOR SLEEP   Vitamin D3 250 MCG (10000 UT) Tabs Take by mouth.   zinc gluconate 50 MG tablet Take 50 mg by mouth daily.   ZINC PO Take by mouth.        Follow-up Information     Maczis, Hedda [place], [place]. Call.   Specialty: General Surgery Why: confirm appointment date and time for post-operative follow up. Contact information: [address] [place] [address] [phone]         Renford Dills, MD. Schedule an appointment as soon as possible for a visit.   Specialty: Internal Medicine Why: Please schedule an appointment and follow up with your primary care in the next 7-10 days. Contact information: [address] [place] [address] [phone]                Allergies  Allergen Reactions   Other Nausea And Vomiting    lobster   Symbicort [Budesonide-Formoterol Fumarate] Nausea Only   Bupropion Rash   Sulfamethoxazole Rash    Consultations: GI, general surgery  Procedures/Studies: DG Cholangiogram Operative  Result Date: 10/07/2021 CLINICAL DATA:  Laparoscopic cholecystectomy EXAM: INTRAOPERATIVE CHOLANGIOGRAM TECHNIQUE: Cholangiographic images from the C-arm fluoroscopic device were submitted for interpretation post-operatively. Please see the procedural report for the amount of contrast and the fluoroscopy time utilized. FLUOROSCOPY: Radiation Exposure Index (as provided by the fluoroscopic device): 6.8 mGy Kerma COMPARISON:  CT abdomen 10/05/2021 FINDINGS: 4 intraoperative fluoroscopic images and 2 cine clips were provided interpretation. Submitted images demonstrate opacification of the intra and extrahepatic bile ducts through cystic duct remnant. No filling defects are present within the common bile duct to indicate choledocholithiasis. There is free flow of contrast into the duodenum. A duodenal diverticulum is noted at the suture Oddi. There is partial opacification of the pancreatic duct. IMPRESSION: Intraoperative  cholangiogram shows no filling defects within the common bile duct. Electronically Signed   By: Acquanetta Belling M.D.   On: 10/07/2021 13:23   CT ABDOMEN W CONTRAST  Result Date: 10/05/2021 CLINICAL DATA:  Acute abdominal pain. Vomiting and diarrhea. Elevated liver enzymes. Cholelithiasis. Right renal calculus. EXAM: CT ABDOMEN WITH CONTRAST TECHNIQUE: Multidetector CT imaging of the abdomen was performed using the standard protocol following bolus administration of intravenous contrast. RADIATION DOSE REDUCTION: This exam was performed according to the departmental dose-optimization program which includes automated exposure control, adjustment of the mA and/or kV according to patient size and/or use of iterative reconstruction technique. CONTRAST:  47mL OMNIPAQUE IOHEXOL 350 MG/ML SOLN COMPARISON:  None Available. FINDINGS: Lower chest: No acute findings. Hepatobiliary: No hepatic masses identified. No evidence of steatosis. A few gallstones are seen, however there is no evidence of cholecystitis or biliary dilatation. Pancreas:  No mass or inflammatory changes. Spleen:  Within normal limits in size and appearance. Adrenals/Urinary Tract: No masses identified. A few small benign-appearing renal cysts are noted (no followup imaging recommended). No evidence of hydronephrosis. Stomach/Bowel: Unremarkable. Vascular/Lymphatic: No pathologically enlarged lymph nodes identified. No acute vascular findings. Aortic atherosclerotic calcification incidentally noted. Other:  None. Musculoskeletal:  No suspicious bone lesions identified. IMPRESSION: Cholelithiasis. No radiographic evidence of cholecystitis or other acute findings. Aortic Atherosclerosis (ICD10-I70.0). Electronically Signed   By: Danae Orleans M.D.   On: 10/05/2021 18:00  US Abdomen Limited RUQ (LIVER/GB)  Result Date: 10/05/2021 CLINICAL DATA:  Right lower quadrant pain EXAM: ULTRASOUND ABDOMEN LIMITED RIGHT UPPER QUADRANT COMPARISON:  None Available.  FINDINGS: Gallbladder: Shadowing gallstones are seen measuring up to 1.6 cm. There is no gallbladder wall thickening or pericholecystic fluid. Sonographic Murphy's sign was reported negative. Common bile duct: Diameter: 8 mm.  There is no intrahepatic biliary ductal dilatation. Liver: No focal lesion identified. Within normal limits in parenchymal echogenicity. Portal vein is patent on color Doppler imaging with normal direction of blood flow towards the liver. Other: An echogenic focus in the right kidney measuring 9 mm likely reflects a renal stone. There is no hydronephrosis. IMPRESSION: 1. Cholelithiasis without evidence of acute cholecystitis. 2. 9 mm nonobstructing right renal stone.  No hydronephrosis. Electronically Signed   By: Lesia Hausen M.D.   On: 10/05/2021 14:50     Subjective: No acute issues or events overnight tolerated procedure well denies nausea vomiting diarrhea constipation headache fevers chills or chest pain   Discharge Exam: Vitals:   10/07/21 2006 10/08/21 0746  BP: 126/60 (!) 105/58  Pulse: 90 82  Resp:  16  Temp: 98.2 F (36.8 C) 98.6 F (37 C)  SpO2: 90% 94%   Vitals:   10/07/21 1211 10/07/21 1644 10/07/21 2006 10/08/21 0746  BP: 133/71 123/60 126/60 (!) 105/58  Pulse: 73 95 90 82  Resp: 16 16  16   Temp: 97.6 F (36.4 C) 98.2 F (36.8 C) 98.2 F (36.8 C) 98.6 F (37 C)  TempSrc: Oral Oral Oral Oral  SpO2: 96% 91% 90% 94%  Weight:      Height:        General: Pt is alert, awake, not in acute distress Cardiovascular: RRR, S1/S2 +, no rubs, no gallops Respiratory: CTA bilaterally, no wheezing, no rhonchi Abdominal: Postoperative bandages clean dry intact Extremities: no edema, no cyanosis  The results of significant diagnostics from this hospitalization (including imaging, microbiology, ancillary and laboratory) are listed below for reference.    Microbiology: No results found for this or any previous visit (from the past 240 hour(s)).    Labs: BNP (last 3 results) No results for input(s): "BNP" in the last 8760 hours. Basic Metabolic Panel: Recent Labs  Lab 10/05/21 1359 10/06/21 0253 10/07/21 0536  NA 137 141 140  K 3.9 3.9 4.1  CL 106 110 107  CO2 23 27 28   GLUCOSE 162* 124* 122*  BUN 14 8 7*  CREATININE 1.23* 1.02* 0.89  CALCIUM 9.3 8.7* 9.0   Liver Function Tests: Recent Labs  Lab 10/05/21 1359 10/06/21 0253 10/07/21 0536  AST 129* 61* 36  ALT 308* 197* 143*  ALKPHOS 159* 112 112  BILITOT 1.8* 1.1 1.0  PROT 7.5 5.8* 5.9*  ALBUMIN 4.0 3.1* 3.2*   Recent Labs  Lab 10/05/21 1359 10/06/21 0253 10/07/21 0536 10/08/21 0336  LIPASE 2,650* 210* 39 30   No results for input(s): "AMMONIA" in the last 168 hours. CBC: Recent Labs  Lab 10/05/21 1359 10/06/21 0253  WBC 11.4* 6.4  HGB 16.9* 14.5  HCT 50.6* 44.0  MCV 93.4 95.0  PLT 408* 271   Cardiac Enzymes: No results for input(s): "CKTOTAL", "CKMB", "CKMBINDEX", "TROPONINI" in the last 168 hours. BNP: Invalid input(s): "POCBNP" CBG: No results for input(s): "GLUCAP" in the last 168 hours. D-Dimer No results for input(s): "DDIMER" in the last 72 hours. Hgb A1c No results for input(s): "HGBA1C" in the last 72 hours. Lipid Profile No results for input(s): "CHOL", "  HDL", "LDLCALC", "TRIG", "CHOLHDL", "LDLDIRECT" in the last 72 hours. Thyroid function studies No results for input(s): "TSH", "T4TOTAL", "T3FREE", "THYROIDAB" in the last 72 hours.  Invalid input(s): "FREET3" Anemia work up No results for input(s): "VITAMINB12", "FOLATE", "FERRITIN", "TIBC", "IRON", "RETICCTPCT" in the last 72 hours. Urinalysis    Component Value Date/Time   COLORURINE YELLOW 10/05/2021 1355   APPEARANCEUR CLEAR 10/05/2021 1355   LABSPEC >1.030 (H) 10/05/2021 1355   PHURINE 5.5 10/05/2021 1355   GLUCOSEU NEGATIVE 10/05/2021 1355   HGBUR NEGATIVE 10/05/2021 1355   BILIRUBINUR MODERATE (A) 10/05/2021 1355   KETONESUR 15 (A) 10/05/2021 1355   PROTEINUR  30 (A) 10/05/2021 1355   NITRITE NEGATIVE 10/05/2021 1355   LEUKOCYTESUR NEGATIVE 10/05/2021 1355   Sepsis Labs Recent Labs  Lab 10/05/21 1359 10/06/21 0253  WBC 11.4* 6.4   Microbiology No results found for this or any previous visit (from the past 240 hour(s)).  Time coordinating discharge: Over 30 minutes  SIGNED:  Azucena Fallen, DO [facility] 10/08/2021, 11:48 AM Pager   If 7PM-7AM, please contact night-coverage [email or web address]

## 2021-10-08 NOTE — Progress Notes (Addendum)
[facility] Progress Note  1 Day Post-Op  Subjective: CC:  NAEO. Reports soreness but has been walking to the bathroom. Tolerating solid food. Voiding without reported urinary sxs. Confirms that she lives with her sister and is active - enjoys weight lifting and hiking/climbing.   Objective: Vital signs in last 24 hours: Temp:  [97.6 F (36.4 C)-98.6 F (37 C)] 98.6 F (37 C) (07/31 0746) Pulse Rate:  [73-95] 82 (07/31 0746) Resp:  [11-16] 16 (07/31 0746) BP: (105-135)/(58-78) 105/58 (07/31 0746) SpO2:  [89 %-99 %] 89 % (07/31 0746) Last BM Date : 10/02/21  Intake/Output from previous day: 07/30 0701 - 07/31 0700 In: 1340 [P.O.:240; I.V.:1000; IV Piggyback:100] Out: 160 [Blood:160] Intake/Output this shift: No intake/output data recorded.  PE: Gen:  Alert, NAD, pleasant Card:  Regular rate and rhythm Pulm:  Normal effort ORA Abd: Soft, appropriately tender, incisions c/d/I w/ skin glue. No drainage.  Skin: warm and dry, no rashes  Psych: A&Ox3   Lab Results:  Recent Labs    10/05/21 1359 10/06/21 0253  WBC 11.4* 6.4  HGB 16.9* 14.5  HCT 50.6* 44.0  PLT 408* 271   BMET Recent Labs    10/06/21 0253 10/07/21 0536  NA 141 140  K 3.9 4.1  CL 110 107  CO2 27 28  GLUCOSE 124* 122*  BUN 8 7*  CREATININE 1.02* 0.89  CALCIUM 8.7* 9.0   PT/INR No results for input(s): "LABPROT", "INR" in the last 72 hours. CMP     Component Value Date/Time   NA 140 10/07/2021 0536   K 4.1 10/07/2021 0536   CL 107 10/07/2021 0536   CO2 28 10/07/2021 0536   GLUCOSE 122 (H) 10/07/2021 0536   BUN 7 (L) 10/07/2021 0536   CREATININE 0.89 10/07/2021 0536   CALCIUM 9.0 10/07/2021 0536   PROT 5.9 (L) 10/07/2021 0536   ALBUMIN 3.2 (L) 10/07/2021 0536   AST 36 10/07/2021 0536   ALT 143 (H) 10/07/2021 0536   ALKPHOS 112 10/07/2021 0536   BILITOT 1.0 10/07/2021 0536   GFRNONAA >60 10/07/2021 0536   GFRAA >60 03/19/2018 2330   Lipase     Component Value Date/Time    LIPASE 30 10/08/2021 0336       Studies/Results: DG Cholangiogram Operative  Result Date: 10/07/2021 CLINICAL DATA:  Laparoscopic cholecystectomy EXAM: INTRAOPERATIVE CHOLANGIOGRAM TECHNIQUE: Cholangiographic images from the C-arm fluoroscopic device were submitted for interpretation post-operatively. Please see the procedural report for the amount of contrast and the fluoroscopy time utilized. FLUOROSCOPY: Radiation Exposure Index (as provided by the fluoroscopic device): 6.8 mGy Kerma COMPARISON:  CT abdomen 10/05/2021 FINDINGS: 4 intraoperative fluoroscopic images and 2 cine clips were provided interpretation. Submitted images demonstrate opacification of the intra and extrahepatic bile ducts through cystic duct remnant. No filling defects are present within the common bile duct to indicate choledocholithiasis. There is free flow of contrast into the duodenum. A duodenal diverticulum is noted at the suture Oddi. There is partial opacification of the pancreatic duct. IMPRESSION: Intraoperative cholangiogram shows no filling defects within the common bile duct. Electronically Signed   By: Acquanetta Belling M.D.   On: 10/07/2021 13:23    Anti-infectives: Anti-infectives (From admission, onward)    Start     Dose/Rate Route Frequency Ordered Stop   10/07/21 0600  cefoTEtan (CEFOTAN) 2 g in sodium chloride 0.9 % 100 mL IVPB        2 g 200 mL/hr over 30 Minutes Intravenous On call to O.R. 10/06/21  [id number] 10/07/21 1212        Assessment/Plan  POD#1 s/p lap chole with IOC for gallstone pancreatitis - afebrile, VSS, tolerating PO and mobilizing independently - stable for discharge from [facility] perspective. Follow up arranged. Pain Rx provided. Activity restrictions discussed with patient and her sister over the phone      LOS: 3 days     Hosie Spangle, [facility] Please see Amion for pager number during day hours 7:00am-4:30pm

## 2021-10-08 NOTE — Discharge Instructions (Signed)
[facility] LAPAROSCOPIC SURGERY: POST OP INSTRUCTIONS Always review your discharge instruction sheet given to you by the facility where your surgery was performed. IF YOU HAVE DISABILITY OR FAMILY LEAVE FORMS, YOU MUST BRING THEM TO THE OFFICE FOR PROCESSING.   DO NOT GIVE THEM TO YOUR DOCTOR.  PAIN CONTROL  First take acetaminophen (Tylenol) AND/or ibuprofen (Advil) to control your pain after surgery.  Follow directions on package.  Taking acetaminophen (Tylenol) and/or ibuprofen (Advil) regularly after surgery will help to control your pain and lower the amount of prescription pain medication you may need.  You should not take more than 3,000 mg (3 grams) of acetaminophen (Tylenol) in 24 hours.  You should not take ibuprofen (Advil), aleve, motrin, naprosyn or other NSAIDS if you have a history of stomach ulcers or chronic kidney disease.  A prescription for pain medication may be given to you upon discharge.  Take your pain medication as prescribed, if you still have uncontrolled pain after taking acetaminophen (Tylenol) or ibuprofen (Advil). Use ice packs to help control pain. If you need a refill on your pain medication, please contact your pharmacy.  They will contact our office to request authorization. Prescriptions will not be filled after 5pm or on week-ends.  HOME MEDICATIONS Take your usually prescribed medications unless otherwise directed.  DIET You should follow a light diet the first few days after arrival home.  Be sure to include lots of fluids daily. Avoid fatty, fried foods.   CONSTIPATION It is common to experience some constipation after surgery and if you are taking pain medication.  Increasing fluid intake and taking a stool softener (such as Colace) will usually help or prevent this problem from occurring.  A mild laxative (Milk of Magnesia or Miralax) should be taken according to package instructions if there are no bowel movements after 48  hours.  WOUND/INCISION CARE Most patients will experience some swelling and bruising in the area of the incisions.  Ice packs will help.  Swelling and bruising can take several days to resolve.  Unless discharge instructions indicate otherwise, follow guidelines below  STERI-STRIPS - you may remove your outer bandages 48 hours after surgery, and you may shower at that time.  You have steri-strips (small skin tapes) in place directly over the incision.  These strips should be left on the skin for 7-10 days.   DERMABOND/SKIN GLUE - you may shower in 24 hours.  The glue will flake off over the next 2-3 weeks. Any sutures or staples will be removed at the office during your follow-up visit.  ACTIVITIES You may resume regular (light) daily activities beginning the next day--such as daily self-care, walking, climbing stairs--gradually increasing activities as tolerated.  You may have sexual intercourse when it is comfortable.  Refrain from any heavy lifting or straining until approved by your doctor. You may drive when you are no longer taking prescription pain medication, you can comfortably wear a seatbelt, and you can safely maneuver your car and apply brakes.  FOLLOW-UP You should see your doctor in the office for a follow-up appointment approximately 2-3 weeks after your surgery.  You should have been given your post-op/follow-up appointment when your surgery was scheduled.  If you did not receive a post-op/follow-up appointment, make sure that you call for this appointment within a day or two after you arrive home to insure a convenient appointment time.   WHEN TO CALL YOUR DOCTOR: Fever over 101.0 Inability to urinate Continued bleeding from incision.   Increased pain, redness, or drainage from the incision. Increasing abdominal pain  The clinic staff is available to answer your questions during regular business hours.  Please don't hesitate to call and ask to speak to one of the nurses for  clinical concerns.  If you have a medical emergency, go to the nearest emergency room or call 911.  A surgeon from [facility] is always on call at the hospital. [address], [address], [place], [place]  [address] ? [address] [address], [place], [place]   [address] [phone] ? [phone] ? FAX [phone] Web site: [email or web address]  

## 2021-10-09 LAB — SURGICAL PATHOLOGY

## 2021-10-11 ENCOUNTER — Telehealth: Payer: Medicare Other | Admitting: Psychiatry

## 2021-10-15 DIAGNOSIS — I700 Atherosclerosis of aorta: Secondary | ICD-10-CM | POA: Diagnosis not present

## 2021-10-15 DIAGNOSIS — K8050 Calculus of bile duct without cholangitis or cholecystitis without obstruction: Secondary | ICD-10-CM | POA: Diagnosis not present

## 2021-10-16 ENCOUNTER — Telehealth: Payer: Self-pay

## 2021-10-17 ENCOUNTER — Other Ambulatory Visit: Payer: Self-pay

## 2021-10-17 DIAGNOSIS — F5101 Primary insomnia: Secondary | ICD-10-CM

## 2021-10-17 MED ORDER — DAYVIGO 5 MG PO TABS: 5.0000 mg | ORAL_TABLET | Freq: Every day | ORAL | 0 refills | Status: DC

## 2021-10-17 NOTE — Telephone Encounter (Signed)
Yes, ok to pend for early refill on 10/21/21 due to travel out of country.

## 2021-10-17 NOTE — Telephone Encounter (Signed)
Pended.

## 2021-10-18 DIAGNOSIS — I8312 Varicose veins of left lower extremity with inflammation: Secondary | ICD-10-CM | POA: Diagnosis not present

## 2021-10-29 IMAGING — CR DG CHEST 2V
2 series · 2 of 2 positions shown · non-contrast
Comparison: Chest radiograph 07/31/2016

CLINICAL DATA: Subacute cough. Dry hacking cough since having COVID
2 weeks ago. No chest pain or shortness of breath.

EXAM:
CHEST - 2 VIEW

[w chest pa]
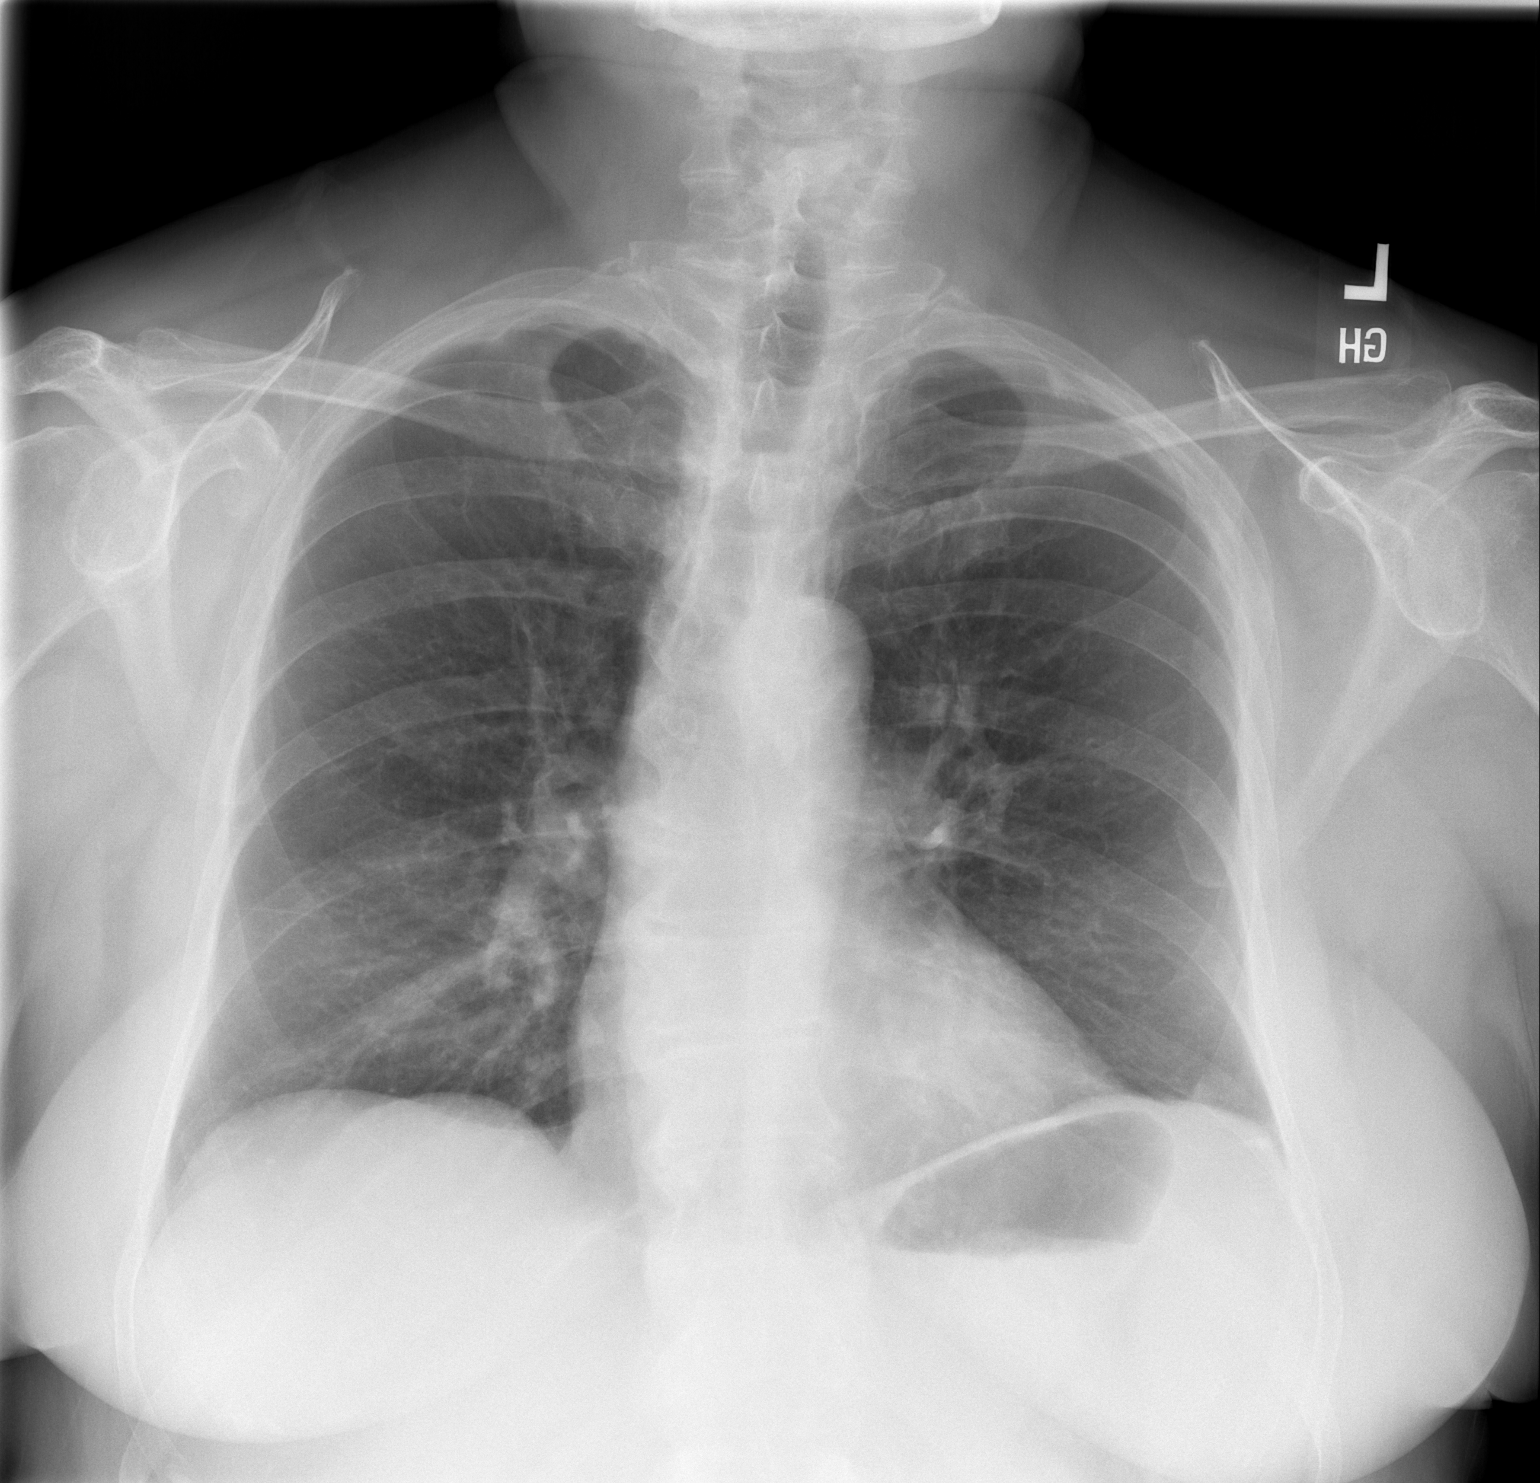

[w chest lat]
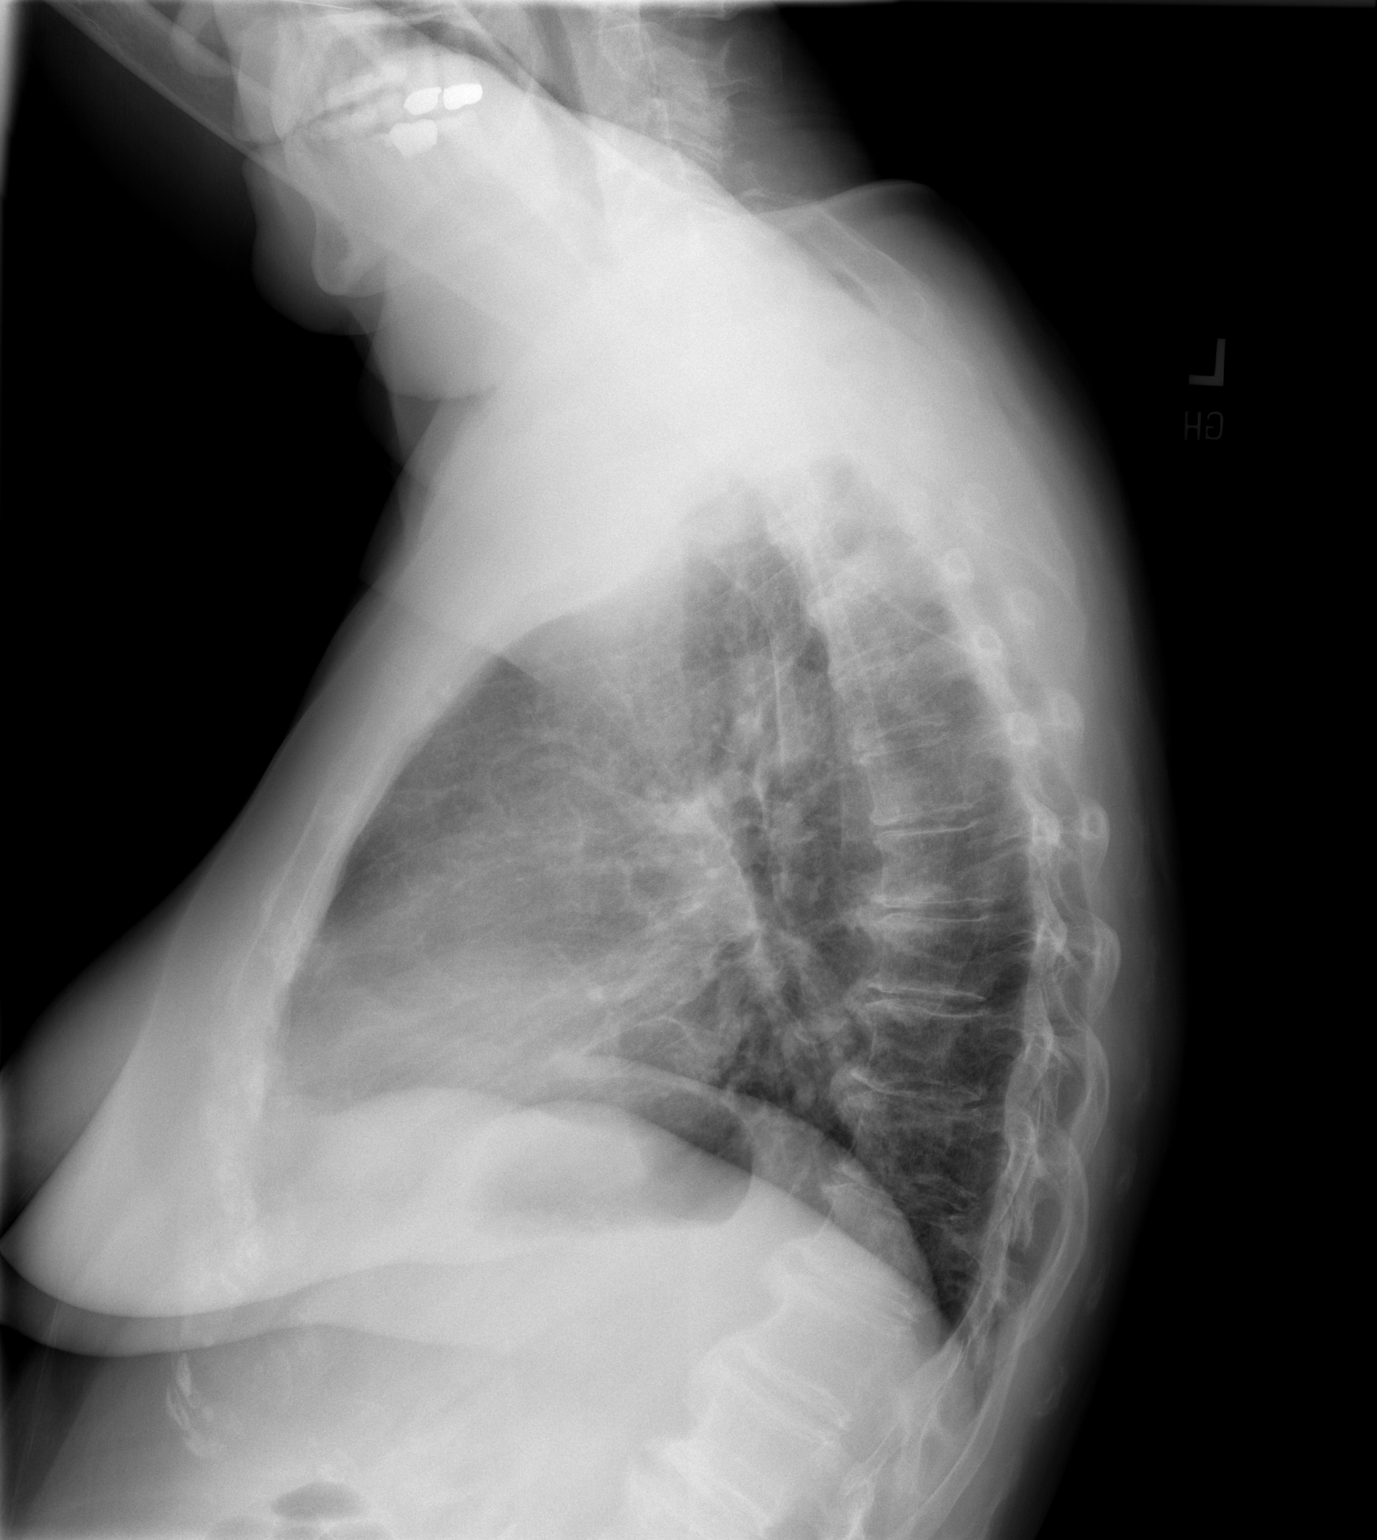

[2 of 2 positions shown; findings below may reference images not displayed]

FINDINGS: The heart size and mediastinal contours are within normal limits.
Both lungs are clear. No pleural effusion or pneumothorax. Stable
pleural thickening at the right apex. Multilevel degenerative
changes throughout the thoracic spine and visualized upper lumbar
spine.
IMPRESSION: No active cardiopulmonary disease.

## 2021-12-04 ENCOUNTER — Other Ambulatory Visit: Payer: Self-pay | Admitting: Psychiatry

## 2021-12-04 DIAGNOSIS — F3342 Major depressive disorder, recurrent, in full remission: Secondary | ICD-10-CM

## 2021-12-05 NOTE — Telephone Encounter (Signed)
Ok to send

## 2022-01-02 ENCOUNTER — Ambulatory Visit (INDEPENDENT_AMBULATORY_CARE_PROVIDER_SITE_OTHER): Payer: Medicare Other | Admitting: Psychiatry

## 2022-01-02 ENCOUNTER — Encounter: Payer: Self-pay | Admitting: Psychiatry

## 2022-01-02 DIAGNOSIS — F3342 Major depressive disorder, recurrent, in full remission: Secondary | ICD-10-CM

## 2022-01-02 DIAGNOSIS — F5101 Primary insomnia: Secondary | ICD-10-CM

## 2022-01-02 MED ORDER — TRAZODONE HCL 100 MG PO TABS: ORAL_TABLET | ORAL | 0 refills | Status: DC

## 2022-01-02 MED ORDER — DAYVIGO 10 MG PO TABS: 10.0000 mg | ORAL_TABLET | Freq: Every day | ORAL | 5 refills | Status: DC

## 2022-01-02 MED ORDER — DESVENLAFAXINE SUCCINATE ER 50 MG PO TB24: ORAL_TABLET | ORAL | 1 refills | Status: DC

## 2022-01-02 MED ORDER — DESVENLAFAXINE SUCCINATE ER 100 MG PO TB24: ORAL_TABLET | ORAL | 1 refills | Status: DC

## 2022-01-02 NOTE — Progress Notes (Signed)
Debbie Schultz [id number] 30-May-1950 71 y.o.  Virtual Visit via Telephone Note  I connected with pt on 01/02/22 at 11:00 AM EDT by telephone and verified that I am speaking with the correct person using two identifiers.   I discussed the limitations, risks, security and privacy concerns of performing an evaluation and management service by telephone and the availability of in person appointments. I also discussed with the patient that there may be a patient responsible charge related to this service. The patient expressed understanding and agreed to proceed.   I discussed the assessment and treatment plan with the patient. The patient was provided an opportunity to ask questions and all were answered. The patient agreed with the plan and demonstrated an understanding of the instructions.   The patient was advised to call back or seek an in-person evaluation if the symptoms worsen or if the condition fails to improve as anticipated.  I provided 30 minutes of non-face-to-face time during this encounter.  The patient was located at home.  The provider was located at [place].   Thayer Headings, PMHNP   Subjective:   Patient ID:  Debbie Schultz is a 71 y.o. (DOB 1950-03-30) female.  Chief Complaint:  Chief Complaint  Patient presents with   Insomnia   Follow-up    Depression    HPI Debbie Schultz presents for follow-up of depression, anxiety, and insomnia. She reports that she was having trouble sleeping and increased Trazodone from 150 mg to 200 mg QHS. She reports that sleep improved with 200 mg dose, however she notices excessive grogginess. She reports that she is sleeping 9-10 hours with 200 mg dose of Trazodone. She reports that with Trazodone 150 mg it was taking 1-2 hours to fall asleep and will sleep for 8 hours once she falls asleep.   She denies depressed mood. She denies anxiety other than worry about her cat when cat when cat was sick. She reports that her  energy has been lower after cholecystectomy and now COVID. Motivation has been good. Appetite has been good. She reports losing 7 lbs with COVID. Concentration is adequate overall. Denies SI.   She reports that she has been recovering from [place].  She reports that she had a severe stomach ache in late July. She had severe gallstones and had to have her gallbladder removed.   She just returned from trip to [place] for 5 weeks.   Cat was recently not feeling well and this affected her mood.   Dayvigo 5 mg #90 was last filled 10/21/21.  Review of Systems:  Review of Systems  Constitutional:  Positive for fatigue.  Musculoskeletal:  Negative for gait problem.  Neurological:  Negative for tremors.  Psychiatric/Behavioral:         Please refer to HPI    Medications: I have reviewed the patient's current medications.  Current Outpatient Medications  Medication Sig Dispense Refill   apixaban (ELIQUIS) 2.5 MG TABS tablet Take 2.5 mg by mouth 2 (two) times daily.     Cholecalciferol (VITAMIN D3) 10000 units TABS Take by mouth.     fluticasone (FLONASE) 50 MCG/ACT nasal spray Place 1 spray into both nostrils daily.  0   L-Methylfolate-Algae (DEPLIN 15) 15-90.314 MG CAPS TAKE 1 CAPSULE BY MOUTH DAILY 90 capsule 3   LAGEVRIO 200 MG CAPS capsule Take by mouth.     Lemborexant (DAYVIGO) 10 MG TABS Take 10 mg by mouth at bedtime. 30 tablet 5   liothyronine (CYTOMEL) 5 MCG tablet Take 5  mcg by mouth daily.     OMEGA-3 FATTY ACIDS PO Take by mouth.     propranolol (INDERAL) 10 MG tablet TAKE 1 TABLET BY MOUTH TWICE A DAY 180 tablet 0   zinc gluconate 50 MG tablet Take 50 mg by mouth daily.     acetaminophen (TYLENOL) 500 MG tablet Take 2 tablets (1,000 mg total) by mouth every 6 (six) hours as needed. (Patient not taking: Reported on 01/02/2022) 100 tablet 2   desvenlafaxine (PRISTIQ) 100 MG 24 hr tablet TAKE ONE TABLET BY MOUTH DAILY ALONG WITH 50 MG TO EQUAL TOTAL DOSE OF 150 MG 90 tablet 1    desvenlafaxine (PRISTIQ) 50 MG 24 hr tablet TAKE ONE TABLET BY MOUTH DAILY; TAKE WITH 100 MG TO EQUAL 150 MG DAILY 90 tablet 1   ondansetron (ZOFRAN) 4 MG tablet Take 4 mg by mouth every 8 (eight) hours as needed for nausea or vomiting. (Patient not taking: Reported on 01/02/2022)     oxyCODONE (OXY IR/ROXICODONE) 5 MG immediate release tablet Take 1 tablet (5 mg total) by mouth every 6 (six) hours as needed for moderate pain (not relieved by tylenol). (Patient not taking: Reported on 01/02/2022) 15 tablet 0   pantoprazole (PROTONIX) 40 MG tablet Take 40 mg by mouth daily. (Patient not taking: Reported on 01/02/2022)     traZODone (DESYREL) 100 MG tablet TAKE 1 TO 2 TABLETS BY MOUTH AT BEDTIME FOR SLEEP 180 tablet 0   No current facility-administered medications for this visit.    Medication Side Effects: Other: Excessive grogginess with Trazodone 200 mg  Allergies:  Allergies  Allergen Reactions   Other Nausea And Vomiting    lobster   Symbicort [Budesonide-Formoterol Fumarate] Nausea Only   Bupropion Rash   Sulfamethoxazole Rash    Past Medical History:  Diagnosis Date   Anxiety    Arthritis    bilateral knees   Back pain    Depression    Dysrhythmia    "high heart rate".  seen last Dec 2018   Headache    resolved   Hypothyroid    Insomnia     Family History  Problem Relation Age of Onset   Heart disease Mother        PPM   Heart attack Father    Diabetes Maternal Aunt    Stroke Paternal Grandmother    Headache Neg Hx     Social History   Socioeconomic History   Marital status: Unknown    Spouse name: Not on file   Number of children: 0   Years of education: Masters   Highest education level: Not on file  Occupational History   Occupation: Retired    Comment: Pharmacist, hospital  Tobacco Use   Smoking status: Never   Smokeless tobacco: Never  Vaping Use   Vaping Use: Never used  Substance and Sexual Activity   Alcohol use: Yes    Alcohol/week: 0.0 standard drinks  of alcohol    Comment: once a month   Drug use: No   Sexual activity: Not on file  Other Topics Concern   Not on file  Social History Narrative   Lives with mother.   Caffeine use: 1 cup coffee every morning   Tea rare   Social Determinants of Health   Financial Resource Strain: Not on file  Food Insecurity: Not on file  Transportation Needs: Not on file  Physical Activity: Not on file  Stress: Not on file  Social Connections: Not on file  Intimate Partner Violence: Not on file    Past Medical History, Surgical history, Social history, and Family history were reviewed and updated as appropriate.   Please see review of systems for further details on the patient's review from today.   Objective:   Physical Exam:  Pulse 78   Physical Exam Neurological:     Mental Status: She is alert and oriented to person, place, and time.     Cranial Nerves: No dysarthria.  Psychiatric:        Attention and Perception: Attention and perception normal.        Mood and Affect: Mood normal.        Speech: Speech normal.        Behavior: Behavior is cooperative.        Thought Content: Thought content normal. Thought content is not paranoid or delusional. Thought content does not include homicidal or suicidal ideation. Thought content does not include homicidal or suicidal plan.        Cognition and Memory: Cognition and memory normal.        Judgment: Judgment normal.     Comments: Insight intact     Lab Review:     Component Value Date/Time   NA 140 10/07/2021 0536   K 4.1 10/07/2021 0536   CL 107 10/07/2021 0536   CO2 28 10/07/2021 0536   GLUCOSE 122 (H) 10/07/2021 0536   BUN 7 (L) 10/07/2021 0536   CREATININE 0.89 10/07/2021 0536   CALCIUM 9.0 10/07/2021 0536   PROT 5.9 (L) 10/07/2021 0536   ALBUMIN 3.2 (L) 10/07/2021 0536   AST 36 10/07/2021 0536   ALT 143 (H) 10/07/2021 0536   ALKPHOS 112 10/07/2021 0536   BILITOT 1.0 10/07/2021 0536   GFRNONAA >60 10/07/2021 0536    GFRAA >60 03/19/2018 2330       Component Value Date/Time   WBC 6.4 10/06/2021 0253   RBC 4.63 10/06/2021 0253   HGB 14.5 10/06/2021 0253   HCT 44.0 10/06/2021 0253   PLT 271 10/06/2021 0253   MCV 95.0 10/06/2021 0253   MCH 31.3 10/06/2021 0253   MCHC 33.0 10/06/2021 0253   RDW 12.0 10/06/2021 0253   LYMPHSABS 2.2 03/19/2018 2330   MONOABS 0.6 03/19/2018 2330   EOSABS 0.2 03/19/2018 2330   BASOSABS 0.1 03/19/2018 2330    No results found for: "POCLITH", "LITHIUM"   No results found for: "PHENYTOIN", "PHENOBARB", "VALPROATE", "CBMZ"   .res Assessment: Plan:    Pt seen for 30 minutes and time spent discussing recent insomnia and grogginess with higher dose of Trazodone at 200 mg po QHS. Discussed that it may be helpful to increase Dayvigo to 10 mg po QHS and then she may be able to reduce Trazodone to 150 mg po QHS since she was previously tolerating this dose. Pt is in agreement with this plan.  Continue Pristiq 150 mg daily for depression and anxiety.  Pt to follow-up in 6 months or sooner if clinically indicated.  Patient advised to contact office with any questions, adverse effects, or acute worsening in signs and symptoms.   Debbie Schultz was seen today for insomnia and follow-up.  Diagnoses and all orders for this visit:  Recurrent major depressive disorder, in full remission ([place]) -     desvenlafaxine (PRISTIQ) 100 MG 24 hr tablet; TAKE ONE TABLET BY MOUTH DAILY ALONG WITH 50 MG TO EQUAL TOTAL DOSE OF 150 MG -     desvenlafaxine (PRISTIQ) 50 MG 24 hr tablet; TAKE  ONE TABLET BY MOUTH DAILY; TAKE WITH 100 MG TO EQUAL 150 MG DAILY  Primary insomnia -     Lemborexant (DAYVIGO) 10 MG TABS; Take 10 mg by mouth at bedtime. -     traZODone (DESYREL) 100 MG tablet; TAKE 1 TO 2 TABLETS BY MOUTH AT BEDTIME FOR SLEEP    Please see After Visit Summary for patient specific instructions.  Future Appointments  Date Time Provider [place]  01/23/2022  2:00 PM [facility] DX DEXA 1  [facility]    No orders of the defined types were placed in this encounter.     -------------------------------

## 2022-01-10 DIAGNOSIS — Z1231 Encounter for screening mammogram for malignant neoplasm of breast: Secondary | ICD-10-CM | POA: Diagnosis not present

## 2022-01-11 ENCOUNTER — Other Ambulatory Visit: Payer: Self-pay | Admitting: Psychiatry

## 2022-01-16 DIAGNOSIS — I8312 Varicose veins of left lower extremity with inflammation: Secondary | ICD-10-CM | POA: Diagnosis not present

## 2022-01-21 ENCOUNTER — Telehealth: Payer: Self-pay | Admitting: Psychiatry

## 2022-01-21 DIAGNOSIS — F5101 Primary insomnia: Secondary | ICD-10-CM

## 2022-01-21 NOTE — Telephone Encounter (Signed)
Patient lvm at 3:56 stating that she was given a month trial for Dayvigo "and it seems to be pretty successful." She would like a three mont prescription sent to [facility] [address] [place]

## 2022-01-22 MED ORDER — DAYVIGO 10 MG PO TABS: 10.0000 mg | ORAL_TABLET | Freq: Every day | ORAL | 1 refills | Status: DC

## 2022-01-22 NOTE — Telephone Encounter (Signed)
Patient rtc stating 10 mg is needed.

## 2022-01-22 NOTE — Telephone Encounter (Signed)
Please review

## 2022-01-22 NOTE — Telephone Encounter (Signed)
10 MG

## 2022-01-22 NOTE — Telephone Encounter (Signed)
Please let her know that a 90-day supply has been sent to her pharmacy with a fill date of 01/31/22.

## 2022-01-22 NOTE — Telephone Encounter (Signed)
LVM to tell staff which dose she needs sent

## 2022-01-23 ENCOUNTER — Ambulatory Visit
Admission: RE | Admit: 2022-01-23 | Discharge: 2022-01-23 | Disposition: A | Discharge status: Home / Self Care | Payer: Medicare Other | Source: Ambulatory Visit | Attending: Internal Medicine | Admitting: Internal Medicine

## 2022-01-23 DIAGNOSIS — E2839 Other primary ovarian failure: Secondary | ICD-10-CM

## 2022-01-23 DIAGNOSIS — Z780 Asymptomatic menopausal state: Secondary | ICD-10-CM | POA: Diagnosis not present

## 2022-01-23 DIAGNOSIS — M8588 Other specified disorders of bone density and structure, other site: Secondary | ICD-10-CM | POA: Diagnosis not present

## 2022-01-28 DIAGNOSIS — J029 Acute pharyngitis, unspecified: Secondary | ICD-10-CM | POA: Diagnosis not present

## 2022-02-08 DIAGNOSIS — J0110 Acute frontal sinusitis, unspecified: Secondary | ICD-10-CM | POA: Diagnosis not present

## 2022-02-08 DIAGNOSIS — J40 Bronchitis, not specified as acute or chronic: Secondary | ICD-10-CM | POA: Diagnosis not present

## 2022-02-08 DIAGNOSIS — J9801 Acute bronchospasm: Secondary | ICD-10-CM | POA: Diagnosis not present

## 2022-03-12 DIAGNOSIS — I83892 Varicose veins of left lower extremities with other complications: Secondary | ICD-10-CM | POA: Diagnosis not present

## 2022-03-12 DIAGNOSIS — I8002 Phlebitis and thrombophlebitis of superficial vessels of left lower extremity: Secondary | ICD-10-CM | POA: Diagnosis not present

## 2022-03-12 DIAGNOSIS — I10 Essential (primary) hypertension: Secondary | ICD-10-CM | POA: Diagnosis not present

## 2022-03-12 DIAGNOSIS — R600 Localized edema: Secondary | ICD-10-CM | POA: Diagnosis not present

## 2022-04-02 DIAGNOSIS — H524 Presbyopia: Secondary | ICD-10-CM | POA: Diagnosis not present

## 2022-04-03 DIAGNOSIS — I83892 Varicose veins of left lower extremities with other complications: Secondary | ICD-10-CM | POA: Diagnosis not present

## 2022-04-04 DIAGNOSIS — M79674 Pain in right toe(s): Secondary | ICD-10-CM | POA: Diagnosis not present

## 2022-04-04 DIAGNOSIS — M549 Dorsalgia, unspecified: Secondary | ICD-10-CM | POA: Diagnosis not present

## 2022-04-04 DIAGNOSIS — H9202 Otalgia, left ear: Secondary | ICD-10-CM | POA: Diagnosis not present

## 2022-04-08 DIAGNOSIS — I83892 Varicose veins of left lower extremities with other complications: Secondary | ICD-10-CM | POA: Diagnosis not present

## 2022-04-08 DIAGNOSIS — Z09 Encounter for follow-up examination after completed treatment for conditions other than malignant neoplasm: Secondary | ICD-10-CM | POA: Diagnosis not present

## 2022-04-10 ENCOUNTER — Other Ambulatory Visit: Payer: Self-pay | Admitting: Psychiatry

## 2022-04-10 DIAGNOSIS — F5101 Primary insomnia: Secondary | ICD-10-CM

## 2022-04-15 DIAGNOSIS — Z01419 Encounter for gynecological examination (general) (routine) without abnormal findings: Secondary | ICD-10-CM | POA: Diagnosis not present

## 2022-04-15 DIAGNOSIS — N952 Postmenopausal atrophic vaginitis: Secondary | ICD-10-CM | POA: Diagnosis not present

## 2022-04-15 DIAGNOSIS — Z124 Encounter for screening for malignant neoplasm of cervix: Secondary | ICD-10-CM | POA: Diagnosis not present

## 2022-04-26 ENCOUNTER — Encounter: Payer: Self-pay | Admitting: Podiatry

## 2022-04-26 ENCOUNTER — Ambulatory Visit: Payer: Medicare HMO | Admitting: Podiatry

## 2022-04-26 ENCOUNTER — Ambulatory Visit (INDEPENDENT_AMBULATORY_CARE_PROVIDER_SITE_OTHER): Payer: Medicare HMO

## 2022-04-26 DIAGNOSIS — M2041 Other hammer toe(s) (acquired), right foot: Secondary | ICD-10-CM | POA: Diagnosis not present

## 2022-04-26 DIAGNOSIS — M7751 Other enthesopathy of right foot: Secondary | ICD-10-CM

## 2022-04-26 DIAGNOSIS — M2042 Other hammer toe(s) (acquired), left foot: Secondary | ICD-10-CM | POA: Diagnosis not present

## 2022-04-26 MED ORDER — TRIAMCINOLONE ACETONIDE 10 MG/ML IJ SUSP
10.0000 mg | Freq: Once | INTRAMUSCULAR | Status: AC
  Administered 2022-04-26: 10 mg

## 2022-04-26 NOTE — Progress Notes (Signed)
Subjective:   Patient ID: Debbie Schultz, female   DOB: 72 y.o.   MRN: [id number]   HPI Patient presents with inflammation in her right joint surfaces second and third and also has a hammertoe deformity second digit left that has been corrected in number years ago but is giving her trouble.  Patient is quite active likes to hike   ROS      Objective:  Physical Exam  Neurovascular status was found to be intact muscle strength adequate with inflammation pain around the second and third MPJ right fluid buildup around this joint history of digital procedure second and third toes with also it abnormal deformity of the second digit left mildly tender controlled with shoe gear modification bandage     Assessment:  [place] condition capsulitis of the lesser MPJs right along with structural digital deformity second left     Plan:  H&P reviewed forefoot block right aspirated the second and third MPJs getting a small amount of clear fluid injected quarter cc dexamethasone Kenalog into each joint and reappoint 1 month to reevaluate.  Do not recommend treatment for the left unless it were to get worse  X-ray indicates has had structural bunion correction right and hallux correction with digital fusion digits 2 3 right and digit to left with possible elongation second third metatarsal segment right

## 2022-05-15 DIAGNOSIS — I83892 Varicose veins of left lower extremities with other complications: Secondary | ICD-10-CM | POA: Diagnosis not present

## 2022-05-24 ENCOUNTER — Encounter: Payer: Self-pay | Admitting: Podiatry

## 2022-05-24 ENCOUNTER — Ambulatory Visit: Payer: Medicare HMO | Admitting: Podiatry

## 2022-05-24 DIAGNOSIS — M205X2 Other deformities of toe(s) (acquired), left foot: Secondary | ICD-10-CM

## 2022-05-24 DIAGNOSIS — M205X1 Other deformities of toe(s) (acquired), right foot: Secondary | ICD-10-CM

## 2022-05-24 DIAGNOSIS — M7751 Other enthesopathy of right foot: Secondary | ICD-10-CM | POA: Diagnosis not present

## 2022-05-24 NOTE — Progress Notes (Signed)
Subjective:   Patient ID: Debbie Schultz, female   DOB: 72 y.o.   MRN: [id number]   HPI Patient states she is improving but still having some discomfort and all she cares about is the ability to hike knowing she may need surgery someday   ROS      Objective:  Physical Exam  Neurovascular status intact with inflammation of the lesser MPJs 2 and 3 right that have improved but are still moderately tender with digital deformity bilateral     Assessment:  Improvement capsulitis right with previous treatment with pain still present to a degree     Plan:  H&P reviewed conditions at great length discussed rigid bottom shoes dispensed metatarsal pads and discussed shortening osteotomies which may be possible.  Explained procedure risk and at this point organ to hold off and hopefully it will continue to respond conservatively and patient will be seen back as symptoms indicate that if we do do anything we would want to wait till the winter if possible

## 2022-06-03 DIAGNOSIS — I8002 Phlebitis and thrombophlebitis of superficial vessels of left lower extremity: Secondary | ICD-10-CM | POA: Diagnosis not present

## 2022-06-13 ENCOUNTER — Telehealth: Payer: Medicare HMO | Admitting: Psychiatry

## 2022-06-28 DIAGNOSIS — I83892 Varicose veins of left lower extremities with other complications: Secondary | ICD-10-CM | POA: Diagnosis not present

## 2022-07-05 DIAGNOSIS — Z85828 Personal history of other malignant neoplasm of skin: Secondary | ICD-10-CM | POA: Diagnosis not present

## 2022-07-05 DIAGNOSIS — D0462 Carcinoma in situ of skin of left upper limb, including shoulder: Secondary | ICD-10-CM | POA: Diagnosis not present

## 2022-07-05 DIAGNOSIS — L821 Other seborrheic keratosis: Secondary | ICD-10-CM | POA: Diagnosis not present

## 2022-07-08 ENCOUNTER — Encounter: Payer: Self-pay | Admitting: Psychiatry

## 2022-07-08 ENCOUNTER — Ambulatory Visit (INDEPENDENT_AMBULATORY_CARE_PROVIDER_SITE_OTHER): Payer: Medicare HMO | Admitting: Psychiatry

## 2022-07-08 VITALS — Wt 161.9 lb

## 2022-07-08 DIAGNOSIS — F3342 Major depressive disorder, recurrent, in full remission: Secondary | ICD-10-CM | POA: Diagnosis not present

## 2022-07-08 DIAGNOSIS — F5101 Primary insomnia: Secondary | ICD-10-CM | POA: Diagnosis not present

## 2022-07-08 MED ORDER — TRAZODONE HCL 100 MG PO TABS: 250.0000 mg | ORAL_TABLET | Freq: Every day | ORAL | 0 refills | Status: DC

## 2022-07-08 NOTE — Progress Notes (Signed)
Debbie Schultz [id number] [birth year] 72 y.o.  Virtual Visit via Telephone Note  I connected with pt on 07/08/22 at  2:00 PM EDT by telephone and verified that I am speaking with the correct person using two identifiers.   I discussed the limitations, risks, security and privacy concerns of performing an evaluation and management service by telephone and the availability of in person appointments. I also discussed with the patient that there may be a patient responsible charge related to this service. The patient expressed understanding and agreed to proceed.   I discussed the assessment and treatment plan with the patient. The patient was provided an opportunity to ask questions and all were answered. The patient agreed with the plan and demonstrated an understanding of the instructions.   The patient was advised to call back or seek an in-person evaluation if the symptoms worsen or if the condition fails to improve as anticipated.  I provided 19 minutes of non-face-to-face time during this encounter.  The patient was located at home.  The provider was located at home.   Corie Chiquito, PMHNP   Subjective:   Patient ID:  Debbie Schultz is a 72 y.o. (DOB February 07, 1951) female.  Chief Complaint:  Chief Complaint  Patient presents with   Follow-up    Depression, insomnia    HPI [name] presents for follow-up of depression and insomnia.She reports that Oliva Bustard is effective and costs about $100 with insurance. She ran out of Dayvigo on her trip for 2.5 weeks to [place] to visit family and to see the eclipse.  She increased her Trazodone to 250 mg at bedtime and this has been working ok. She notices some slight grogginess upon awakening after taking Dayvigo or Trazodone. She reports that grogginess resolves after 1-2 hours after awakening. She reports that she would prefer to continue Trazodone only due to cost and similar benefit and side effect compared to [facility]. She reports  that her energy and motivation are ok. She reports, "when I need to do stuff, I can get up and do stuff." Denies depressed mood. She reports that she has not had depression "for a very long time." Denies anxiety, other than in response to some world events. Concentration is adequate. She reports, "I can read for hours." She reports that she and her sister try to eat smaller portions- "I could easily eat a lot." Denies SI.   Going to a concert in [place] and then rock climbing afterwards. She is climbing 3-4 times a week. Will be taking another road trip to the [place] in the next week. They will also be taking a trip from mid-July to mid-October.  Has been taking Trazodone 250 mg po QHS.   Review of Systems:  Review of Systems  Musculoskeletal:  Positive for back pain. Negative for gait problem.  Allergic/Immunologic: Positive for environmental allergies.  Psychiatric/Behavioral:         Please refer to HPI    Medications: I have reviewed the patient's current medications.  Current Outpatient Medications  Medication Sig Dispense Refill   apixaban (ELIQUIS) 2.5 MG TABS tablet Take 2.5 mg by mouth 2 (two) times daily.     Ascorbic Acid (VITAMIN C ER PO) Take by mouth.     Cholecalciferol (VITAMIN D3) 10000 units TABS Take by mouth.     desvenlafaxine (PRISTIQ) 100 MG 24 hr tablet TAKE ONE TABLET BY MOUTH DAILY ALONG WITH 50 MG TO EQUAL TOTAL DOSE OF 150 MG 90 tablet 1  desvenlafaxine (PRISTIQ) 50 MG 24 hr tablet TAKE ONE TABLET BY MOUTH DAILY; TAKE WITH 100 MG TO EQUAL 150 MG DAILY 90 tablet 1   fluticasone (FLONASE) 50 MCG/ACT nasal spray Place 1 spray into both nostrils daily.  0   L-Methylfolate-Algae (DEPLIN 15) 15-90.314 MG CAPS TAKE 1 CAPSULE BY MOUTH DAILY 90 capsule 3   liothyronine (CYTOMEL) 5 MCG tablet Take 5 mcg by mouth daily.     methocarbamol (ROBAXIN) 750 MG tablet Take 750 mg by mouth 3 (three) times daily.     OMEGA-3 FATTY ACIDS PO Take by mouth.     propranolol  (INDERAL) 10 MG tablet TAKE 1 TABLET BY MOUTH TWICE A DAY 180 tablet 0   zinc gluconate 50 MG tablet Take 50 mg by mouth daily.     traZODone (DESYREL) 100 MG tablet Take 2.5 tablets (250 mg total) by mouth at bedtime. 225 tablet 0   No current facility-administered medications for this visit.    Medication Side Effects: Other: Grogginess upon awakening  Allergies:  Allergies  Allergen Reactions   Other Nausea And Vomiting    lobster   Symbicort [Budesonide-Formoterol Fumarate] Nausea Only   Bupropion Rash   Sulfamethoxazole Rash    Past Medical History:  Diagnosis Date   Anxiety    Arthritis    bilateral knees   Back pain    Depression    Dysrhythmia    "high heart rate".  seen last Dec 2018   Headache    resolved   Hypothyroid    Insomnia     Family History  Problem Relation Age of Onset   Heart disease Mother        PPM   Heart attack Father    Diabetes Maternal Aunt    Stroke Paternal Grandmother    Headache Neg Hx     Social History   Socioeconomic History   Marital status: Unknown    Spouse name: Not on file   Number of children: 0   Years of education: Masters   Highest education level: Not on file  Occupational History   Occupation: Retired    Comment: Runner, broadcasting/film/video  Tobacco Use   Smoking status: Never   Smokeless tobacco: Never  Vaping Use   Vaping Use: Never used  Substance and Sexual Activity   Alcohol use: Yes    Alcohol/week: 0.0 standard drinks of alcohol    Comment: once a month   Drug use: No   Sexual activity: Not on file  Other Topics Concern   Not on file  Social History Narrative   Lives with mother.   Caffeine use: 1 cup coffee every morning   Tea rare   Social Determinants of Health   Financial Resource Strain: Not on file  Food Insecurity: Not on file  Transportation Needs: Not on file  Physical Activity: Not on file  Stress: Not on file  Social Connections: Not on file  Intimate Partner Violence: Not on file    Past  Medical History, Surgical history, Social history, and Family history were reviewed and updated as appropriate.   Please see review of systems for further details on the patient's review from today.   Objective:   Physical Exam:  Wt 161 lb 14.4 oz (73.4 kg)   BMI 29.60 kg/m   Physical Exam Constitutional:      General: She is not in acute distress. Musculoskeletal:        General: No deformity.  Neurological:  Mental Status: She is alert and oriented to person, place, and time.     Coordination: Coordination normal.  Psychiatric:        Attention and Perception: Attention and perception normal. She does not perceive auditory or visual hallucinations.        Mood and Affect: Mood normal. Mood is not anxious or depressed. Affect is not labile, blunt, angry or inappropriate.        Speech: Speech normal.        Behavior: Behavior normal.        Thought Content: Thought content normal. Thought content is not paranoid or delusional. Thought content does not include homicidal or suicidal ideation. Thought content does not include homicidal or suicidal plan.        Cognition and Memory: Cognition and memory normal.        Judgment: Judgment normal.     Comments: Insight intact     Lab Review:     Component Value Date/Time   NA 140 10/07/2021 0536   K 4.1 10/07/2021 0536   CL 107 10/07/2021 0536   CO2 28 10/07/2021 0536   GLUCOSE 122 (H) 10/07/2021 0536   BUN 7 (L) 10/07/2021 0536   CREATININE 0.89 10/07/2021 0536   CALCIUM 9.0 10/07/2021 0536   PROT 5.9 (L) 10/07/2021 0536   ALBUMIN 3.2 (L) 10/07/2021 0536   AST 36 10/07/2021 0536   ALT 143 (H) 10/07/2021 0536   ALKPHOS 112 10/07/2021 0536   BILITOT 1.0 10/07/2021 0536   GFRNONAA >60 10/07/2021 0536   GFRAA >60 03/19/2018 2330       Component Value Date/Time   WBC 6.4 10/06/2021 0253   RBC 4.63 10/06/2021 0253   HGB 14.5 10/06/2021 0253   HCT 44.0 10/06/2021 0253   PLT 271 10/06/2021 0253   MCV 95.0 10/06/2021  0253   MCH 31.3 10/06/2021 0253   MCHC 33.0 10/06/2021 0253   RDW 12.0 10/06/2021 0253   LYMPHSABS 2.2 03/19/2018 2330   MONOABS 0.6 03/19/2018 2330   EOSABS 0.2 03/19/2018 2330   BASOSABS 0.1 03/19/2018 2330    No results found for: "POCLITH", "LITHIUM"   No results found for: "PHENYTOIN", "PHENOBARB", "VALPROATE", "CBMZ"   .res Assessment: Plan:    Will discontinue Dayvigo at pt's request due to cost. She reports that Trazodone 250 mg po QHS has similar benefit and side effect (grogginess upon awakening) and prefers to continue Trazodone only. She reports that she will contact office if she experiences worsening insomnia and/or would like to re-start dayvigo in the future.  Will discontinue Dayvigo.  Will increase Trazodone to 250 mg po QHS for insomnia.  Will continue Pristiq 150 mg po qd since depression continues to be in remission.  Continue L-methylfolate 15 mg po qd for depression.  Pt to follow-up in 6 months or sooner if clinically indicated.  Patient advised to contact office with any questions, adverse effects, or acute worsening in signs and symptoms.    Rasheen was seen today for follow-up.  Diagnoses and all orders for this visit:  Recurrent major depressive disorder, in full remission ([facility])  Primary insomnia -     traZODone (DESYREL) 100 MG tablet; Take 2.5 tablets (250 mg total) by mouth at bedtime.    Please see After Visit Summary for patient specific instructions.  No future appointments.   No orders of the defined types were placed in this encounter.     -------------------------------

## 2022-07-11 DIAGNOSIS — I8312 Varicose veins of left lower extremity with inflammation: Secondary | ICD-10-CM | POA: Diagnosis not present

## 2022-07-11 DIAGNOSIS — R739 Hyperglycemia, unspecified: Secondary | ICD-10-CM | POA: Diagnosis not present

## 2022-07-11 DIAGNOSIS — K819 Cholecystitis, unspecified: Secondary | ICD-10-CM | POA: Diagnosis not present

## 2022-07-11 DIAGNOSIS — E039 Hypothyroidism, unspecified: Secondary | ICD-10-CM | POA: Diagnosis not present

## 2022-07-11 DIAGNOSIS — K2970 Gastritis, unspecified, without bleeding: Secondary | ICD-10-CM | POA: Diagnosis not present

## 2022-07-11 DIAGNOSIS — E119 Type 2 diabetes mellitus without complications: Secondary | ICD-10-CM | POA: Diagnosis not present

## 2022-07-22 ENCOUNTER — Other Ambulatory Visit: Payer: Self-pay

## 2022-07-22 ENCOUNTER — Telehealth: Payer: Self-pay | Admitting: Psychiatry

## 2022-07-22 MED ORDER — DAYVIGO 10 MG PO TABS: 10.0000 mg | ORAL_TABLET | Freq: Every day | ORAL | 5 refills | Status: DC

## 2022-07-22 NOTE — Telephone Encounter (Signed)
Patient lvm at 11:05 stating that at last appt with JC she decided to take a break from [facility]. She has decided that wasn't a good idea and needs prescription sent to [facility] [address] [place]

## 2022-07-22 NOTE — Telephone Encounter (Signed)
Pended.

## 2022-07-30 DIAGNOSIS — I83892 Varicose veins of left lower extremities with other complications: Secondary | ICD-10-CM | POA: Diagnosis not present

## 2022-07-30 DIAGNOSIS — I87392 Chronic venous hypertension (idiopathic) with other complications of left lower extremity: Secondary | ICD-10-CM | POA: Diagnosis not present

## 2022-08-13 DIAGNOSIS — I83812 Varicose veins of left lower extremities with pain: Secondary | ICD-10-CM | POA: Diagnosis not present

## 2022-08-13 DIAGNOSIS — M7989 Other specified soft tissue disorders: Secondary | ICD-10-CM | POA: Diagnosis not present

## 2022-08-13 DIAGNOSIS — I83892 Varicose veins of left lower extremities with other complications: Secondary | ICD-10-CM | POA: Diagnosis not present

## 2022-08-27 DIAGNOSIS — I83892 Varicose veins of left lower extremities with other complications: Secondary | ICD-10-CM | POA: Diagnosis not present

## 2022-08-27 DIAGNOSIS — I87392 Chronic venous hypertension (idiopathic) with other complications of left lower extremity: Secondary | ICD-10-CM | POA: Diagnosis not present

## 2022-09-09 DIAGNOSIS — M79672 Pain in left foot: Secondary | ICD-10-CM | POA: Diagnosis not present

## 2022-09-09 DIAGNOSIS — M7741 Metatarsalgia, right foot: Secondary | ICD-10-CM | POA: Diagnosis not present

## 2022-09-16 DIAGNOSIS — M79645 Pain in left finger(s): Secondary | ICD-10-CM | POA: Diagnosis not present

## 2022-09-20 ENCOUNTER — Other Ambulatory Visit: Payer: Self-pay | Admitting: Psychiatry

## 2022-09-20 DIAGNOSIS — F3342 Major depressive disorder, recurrent, in full remission: Secondary | ICD-10-CM

## 2022-09-25 ENCOUNTER — Other Ambulatory Visit: Payer: Self-pay | Admitting: Psychiatry

## 2022-09-25 ENCOUNTER — Telehealth: Payer: Self-pay | Admitting: Psychiatry

## 2022-09-25 DIAGNOSIS — F5101 Primary insomnia: Secondary | ICD-10-CM

## 2022-09-25 NOTE — Telephone Encounter (Signed)
LVM to RC. When is she leaving?

## 2022-09-25 NOTE — Telephone Encounter (Signed)
Pharmacy is:  [facility] [id number] - [address] [place] - [address]   Phone: [phone]  Fax: [phone]

## 2022-09-25 NOTE — Telephone Encounter (Signed)
She called back at 3:20p. She is leaving Sunday.  She is requesting 3 months of the Dayvigo and there's another encounter for refill of the Trazadone and Propranolol.  All 3 meds need to go to [facility].  Next appt 10/23

## 2022-09-25 NOTE — Telephone Encounter (Signed)
Next visit is 01/01/23. Debbie Schultz is traveling outside of the country and needs a refill for her Dayvigo 10 mg for three months as she will be there this long. Pharmacy is:

## 2022-09-26 ENCOUNTER — Other Ambulatory Visit: Payer: Self-pay

## 2022-09-26 MED ORDER — DAYVIGO 10 MG PO TABS: 10.0000 mg | ORAL_TABLET | Freq: Every day | ORAL | 0 refills | Status: DC

## 2022-09-26 NOTE — Telephone Encounter (Signed)
All requested medications pended/sent.

## 2022-09-26 NOTE — Telephone Encounter (Signed)
Rx for trazodone is 100 mg 2.5 tablets. She reports she is taking 1-1/2 to 2 tablets since she is now taking Dayvigo again.

## 2022-09-26 NOTE — Telephone Encounter (Signed)
Pended Dayvigo. Have a question about trazodone dose.

## 2022-09-27 DIAGNOSIS — M79645 Pain in left finger(s): Secondary | ICD-10-CM | POA: Diagnosis not present

## 2022-10-23 ENCOUNTER — Other Ambulatory Visit: Payer: Self-pay | Admitting: Psychiatry

## 2022-10-23 DIAGNOSIS — F3342 Major depressive disorder, recurrent, in full remission: Secondary | ICD-10-CM

## 2022-12-16 ENCOUNTER — Telehealth: Payer: Self-pay | Admitting: Psychiatry

## 2022-12-16 NOTE — Telephone Encounter (Signed)
Due 10/15

## 2022-12-16 NOTE — Telephone Encounter (Signed)
Pt called and she is on vacation. She needs a 90 day supply of [facility]vigo [facility] to [facility] located at [address], [place], [address]. She has called the pharmacy and they will accept a script from [place] and they also can get a 90 day supply. She has 10 days left

## 2022-12-24 ENCOUNTER — Other Ambulatory Visit: Payer: Self-pay

## 2022-12-24 MED ORDER — DAYVIGO 10 MG PO TABS: 10.0000 mg | ORAL_TABLET | Freq: Every day | ORAL | 0 refills | Status: DC

## 2022-12-24 NOTE — Telephone Encounter (Signed)
LVM to [facility] 

## 2022-12-24 NOTE — Telephone Encounter (Signed)
Pended.

## 2023-01-01 ENCOUNTER — Telehealth: Payer: Medicare HMO | Admitting: Psychiatry

## 2023-01-14 ENCOUNTER — Encounter: Payer: Self-pay | Admitting: Psychiatry

## 2023-01-14 ENCOUNTER — Ambulatory Visit (INDEPENDENT_AMBULATORY_CARE_PROVIDER_SITE_OTHER): Payer: Medicare HMO | Admitting: Psychiatry

## 2023-01-14 DIAGNOSIS — F5101 Primary insomnia: Secondary | ICD-10-CM | POA: Diagnosis not present

## 2023-01-14 DIAGNOSIS — F3342 Major depressive disorder, recurrent, in full remission: Secondary | ICD-10-CM | POA: Diagnosis not present

## 2023-01-14 MED ORDER — PROPRANOLOL HCL 10 MG PO TABS: 10.0000 mg | ORAL_TABLET | Freq: Two times a day (BID) | ORAL | 2 refills | Status: DC

## 2023-01-14 MED ORDER — DAYVIGO 10 MG PO TABS
10.0000 mg | ORAL_TABLET | Freq: Every day | ORAL | 1 refills | Status: DC
Start: 2023-03-18 — End: 2023-10-03

## 2023-01-14 MED ORDER — DESVENLAFAXINE SUCCINATE ER 50 MG PO TB24
ORAL_TABLET | ORAL | 1 refills | Status: DC
Start: 2023-01-14 — End: 2023-10-16

## 2023-01-14 MED ORDER — TRAZODONE HCL 100 MG PO TABS
ORAL_TABLET | ORAL | 1 refills | Status: DC
Start: 2023-01-14 — End: 2023-09-29

## 2023-01-14 MED ORDER — DESVENLAFAXINE SUCCINATE ER 100 MG PO TB24
ORAL_TABLET | ORAL | 1 refills | Status: DC
Start: 2023-01-14 — End: 2023-10-16

## 2023-01-14 NOTE — Progress Notes (Unsigned)
Debbie Schultz [id number] 1950-07-16 72 y.o.  Virtual Visit via Telephone Note  I connected with pt on 01/14/23 at 10:45 AM EST by telephone and verified that I am speaking with the correct person using two identifiers.   I discussed the limitations, risks, security and privacy concerns of performing an evaluation and management service by telephone and the availability of in person appointments. I also discussed with the patient that there may be a patient responsible charge related to this service. The patient expressed understanding and agreed to proceed.   I discussed the assessment and treatment plan with the patient. The patient was provided an opportunity to ask questions and all were answered. The patient agreed with the plan and demonstrated an understanding of the instructions.   The patient was advised to call back or seek an in-person evaluation if the symptoms worsen or if the condition fails to improve as anticipated.  I provided 14 minutes of non-face-to-face time during this encounter.  The patient was located at home.  The provider was located at [facility].   Corie Chiquito, PMHNP   Subjective:   Patient ID:  Debbie Schultz is a 72 y.o. (DOB 04-25-1950) female.  Chief Complaint:  Chief Complaint  Patient presents with   Follow-up    Depression, Anxiety, and Insomnia    HPI Debbie Schultz presents for follow-up of anxiety, depression, and insomnia. "Everything is going really well." Sleeping well. Denies depressed mood or anxiety. She reports that her appetite has been good. Energy and motivation have been good. She reports adequate concentration. Denies anhedonia and has been enjoying travel. Denies SI.   She reports that she is typically taking Trazodone 150 mg po at bedtime.   She has been on a road trip to [place] with her sister. They were able to do some rock climbing.   Dayvigo last filled 09/27/22.   Review of Systems:  Review of  Systems  Gastrointestinal: Negative.   Musculoskeletal:  Negative for gait problem.       She reports muscle soreness after climbing.   Neurological:  Negative for dizziness and headaches.  Psychiatric/Behavioral:         Please refer to HPI    Medications: I have reviewed the patient's current medications.  Current Outpatient Medications  Medication Sig Dispense Refill   Ascorbic Acid (VITAMIN C ER PO) Take by mouth.     Cholecalciferol (VITAMIN D3) 10000 units TABS Take by mouth.     L-Methylfolate-Algae (DEPLIN 15) 15-90.314 MG CAPS TAKE 1 CAPSULE BY MOUTH DAILY 90 capsule 3   liothyronine (CYTOMEL) 5 MCG tablet Take 5 mcg by mouth daily.     methocarbamol (ROBAXIN) 750 MG tablet Take 750 mg by mouth at bedtime.     OMEGA-3 FATTY ACIDS PO Take by mouth.     zinc gluconate 50 MG tablet Take 50 mg by mouth daily.     desvenlafaxine (PRISTIQ) 100 MG 24 hr tablet TAKE 1 TABLET BY MOUTH DAILY **TAKE ALONG WITH 50MG  TABLET TO EQUAL 150MG  DAILY** 90 tablet 1   desvenlafaxine (PRISTIQ) 50 MG 24 hr tablet TAKE 1 TABLET BY MOUTH DAILY **TAKE WITH 100MG  TABLET TO EQUAL 150MG  DAILY** 90 tablet 1   fluticasone (FLONASE) 50 MCG/ACT nasal spray Place 1 spray into both nostrils daily. (Patient not taking: Reported on 01/14/2023)  0   [START ON 03/18/2023] Lemborexant (DAYVIGO) 10 MG TABS Take 1 tablet (10 mg total) by mouth at bedtime. 90 tablet 1  propranolol (INDERAL) 10 MG tablet Take 1 tablet (10 mg total) by mouth 2 (two) times daily. 180 tablet 2   traZODone (DESYREL) 100 MG tablet TAKE 1 TO 2 TABLETS BY MOUTH AT BEDTIME FOR SLEEP 180 tablet 1   No current facility-administered medications for this visit.    Medication Side Effects: None  Allergies:  Allergies  Allergen Reactions   Other Nausea And Vomiting    lobster   Symbicort [Budesonide-Formoterol Fumarate] Nausea Only   Bupropion Rash   Sulfamethoxazole Rash    Past Medical History:  Diagnosis Date   Anxiety    Arthritis     bilateral knees   Back pain    Depression    Dysrhythmia    "high heart rate".  seen last Dec 2018   Headache    resolved   Hypothyroid    Insomnia     Family History  Problem Relation Age of Onset   Heart disease Mother        PPM   Heart attack Father    Diabetes Maternal Aunt    Stroke Paternal Grandmother    Headache Neg Hx     Social History   Socioeconomic History   Marital status: Unknown    Spouse name: Not on file   Number of children: 0   Years of education: Masters   Highest education level: Not on file  Occupational History   Occupation: Retired    Comment: Runner, broadcasting/film/video  Tobacco Use   Smoking status: Never   Smokeless tobacco: Never  Vaping Use   Vaping status: Never Used  Substance and Sexual Activity   Alcohol use: Yes    Alcohol/week: 0.0 standard drinks of alcohol    Comment: once a month   Drug use: No   Sexual activity: Not on file  Other Topics Concern   Not on file  Social History Narrative   Lives with mother.   Caffeine use: 1 cup coffee every morning   Tea rare   Social Determinants of Health   Financial Resource Strain: Not on file  Food Insecurity: Not on file  Transportation Needs: Not on file  Physical Activity: Not on file  Stress: Not on file  Social Connections: Unknown (07/23/2021)   Received from [facility]   Social Network    Social Network: Not on file  Intimate Partner Violence: Unknown (06/14/2021)   Received from [facility]   HITS    Physically Hurt: Not on file    Insult or Talk Down To: Not on file    Threaten Physical Harm: Not on file    Scream or Curse: Not on file    Past Medical History, Surgical history, Social history, and Family history were reviewed and updated as appropriate.   Please see review of systems for further details on the patient's review from today.   Objective:   Physical Exam:  There were no vitals taken for this visit.  Physical  Exam Constitutional:      General: She is not in acute distress. Musculoskeletal:        General: No deformity.  Neurological:     Mental Status: She is alert and oriented to person, place, and time.     Coordination: Coordination normal.  Psychiatric:        Attention and Perception: Attention and perception normal. She does not perceive auditory or visual hallucinations.        Mood and Affect: Mood normal. Mood is  not anxious or depressed. Affect is not labile, blunt, angry or inappropriate.        Speech: Speech normal.        Behavior: Behavior normal.        Thought Content: Thought content normal. Thought content is not paranoid or delusional. Thought content does not include homicidal or suicidal ideation. Thought content does not include homicidal or suicidal plan.        Cognition and Memory: Cognition and memory normal.        Judgment: Judgment normal.     Comments: Insight intact     Lab Review:     Component Value Date/Time   NA 140 10/07/2021 0536   K 4.1 10/07/2021 0536   CL 107 10/07/2021 0536   CO2 28 10/07/2021 0536   GLUCOSE 122 (H) 10/07/2021 0536   BUN 7 (L) 10/07/2021 0536   CREATININE 0.89 10/07/2021 0536   CALCIUM 9.0 10/07/2021 0536   PROT 5.9 (L) 10/07/2021 0536   ALBUMIN 3.2 (L) 10/07/2021 0536   AST 36 10/07/2021 0536   ALT 143 (H) 10/07/2021 0536   ALKPHOS 112 10/07/2021 0536   BILITOT 1.0 10/07/2021 0536   GFRNONAA >60 10/07/2021 0536   GFRAA >60 03/19/2018 2330       Component Value Date/Time   WBC 6.4 10/06/2021 0253   RBC 4.63 10/06/2021 0253   HGB 14.5 10/06/2021 0253   HCT 44.0 10/06/2021 0253   PLT 271 10/06/2021 0253   MCV 95.0 10/06/2021 0253   MCH 31.3 10/06/2021 0253   MCHC 33.0 10/06/2021 0253   RDW 12.0 10/06/2021 0253   LYMPHSABS 2.2 03/19/2018 2330   MONOABS 0.6 03/19/2018 2330   EOSABS 0.2 03/19/2018 2330   BASOSABS 0.1 03/19/2018 2330    No results found for: "POCLITH", "LITHIUM"   No results found for:  "PHENYTOIN", "PHENOBARB", "VALPROATE", "CBMZ"   .res Assessment: Plan:    Will continue current plan of care since target signs and symptoms are well controlled without any tolerability issues. Continue Pristiq 150 mg daily for depression and anxiety.  Continue Trazodone 100 mg 1-2 tabs at bedtime as needed for insomnia.  Continue Propranolol 10 mg po BID for anxiety.  Pt to follow-up in 6 months or sooner if clinically indicated.  Patient advised to contact office with any questions, adverse effects, or acute worsening in signs and symptoms.   Debbie "Olegario Messier" was seen today for follow-up.  Diagnoses and all orders for this visit:  Recurrent major depressive disorder, in full remission (HCC) -     desvenlafaxine (PRISTIQ) 50 MG 24 hr tablet; TAKE 1 TABLET BY MOUTH DAILY **TAKE WITH 100MG  TABLET TO EQUAL 150MG  DAILY** -     desvenlafaxine (PRISTIQ) 100 MG 24 hr tablet; TAKE 1 TABLET BY MOUTH DAILY **TAKE ALONG WITH 50MG  TABLET TO EQUAL 150MG  DAILY**  Primary insomnia -     Lemborexant (DAYVIGO) 10 MG TABS; Take 1 tablet (10 mg total) by mouth at bedtime. -     traZODone (DESYREL) 100 MG tablet; TAKE 1 TO 2 TABLETS BY MOUTH AT BEDTIME FOR SLEEP  Other orders -     propranolol (INDERAL) 10 MG tablet; Take 1 tablet (10 mg total) by mouth 2 (two) times daily.    Please see After Visit Summary for patient specific instructions.  No future appointments.  No orders of the defined types were placed in this encounter.     -------------------------------

## 2023-01-16 DIAGNOSIS — Z1231 Encounter for screening mammogram for malignant neoplasm of breast: Secondary | ICD-10-CM | POA: Diagnosis not present

## 2023-01-21 DIAGNOSIS — I82442 Acute embolism and thrombosis of left tibial vein: Secondary | ICD-10-CM | POA: Diagnosis not present

## 2023-01-21 DIAGNOSIS — I872 Venous insufficiency (chronic) (peripheral): Secondary | ICD-10-CM | POA: Diagnosis not present

## 2023-01-21 DIAGNOSIS — R600 Localized edema: Secondary | ICD-10-CM | POA: Diagnosis not present

## 2023-01-21 DIAGNOSIS — M79662 Pain in left lower leg: Secondary | ICD-10-CM | POA: Diagnosis not present

## 2023-01-22 ENCOUNTER — Encounter: Payer: Self-pay | Admitting: Psychiatry

## 2023-01-27 DIAGNOSIS — Z1331 Encounter for screening for depression: Secondary | ICD-10-CM | POA: Diagnosis not present

## 2023-01-27 DIAGNOSIS — Z0000 Encounter for general adult medical examination without abnormal findings: Secondary | ICD-10-CM | POA: Diagnosis not present

## 2023-01-28 DIAGNOSIS — M79645 Pain in left finger(s): Secondary | ICD-10-CM | POA: Diagnosis not present

## 2023-01-30 DIAGNOSIS — I83812 Varicose veins of left lower extremities with pain: Secondary | ICD-10-CM | POA: Diagnosis not present

## 2023-01-30 DIAGNOSIS — I82442 Acute embolism and thrombosis of left tibial vein: Secondary | ICD-10-CM | POA: Diagnosis not present

## 2023-01-31 DIAGNOSIS — M2041 Other hammer toe(s) (acquired), right foot: Secondary | ICD-10-CM | POA: Diagnosis not present

## 2023-01-31 DIAGNOSIS — M7741 Metatarsalgia, right foot: Secondary | ICD-10-CM | POA: Diagnosis not present

## 2023-02-03 DIAGNOSIS — E7800 Pure hypercholesterolemia, unspecified: Secondary | ICD-10-CM | POA: Diagnosis not present

## 2023-02-03 DIAGNOSIS — E1169 Type 2 diabetes mellitus with other specified complication: Secondary | ICD-10-CM | POA: Diagnosis not present

## 2023-02-03 DIAGNOSIS — I700 Atherosclerosis of aorta: Secondary | ICD-10-CM | POA: Diagnosis not present

## 2023-02-03 DIAGNOSIS — E119 Type 2 diabetes mellitus without complications: Secondary | ICD-10-CM | POA: Diagnosis not present

## 2023-02-12 DIAGNOSIS — I82502 Chronic embolism and thrombosis of unspecified deep veins of left lower extremity: Secondary | ICD-10-CM | POA: Diagnosis not present

## 2023-02-12 DIAGNOSIS — I83812 Varicose veins of left lower extremities with pain: Secondary | ICD-10-CM | POA: Diagnosis not present

## 2023-02-12 DIAGNOSIS — I872 Venous insufficiency (chronic) (peripheral): Secondary | ICD-10-CM | POA: Diagnosis not present

## 2023-02-12 DIAGNOSIS — I82542 Chronic embolism and thrombosis of left tibial vein: Secondary | ICD-10-CM | POA: Diagnosis not present

## 2023-03-24 DIAGNOSIS — D485 Neoplasm of uncertain behavior of skin: Secondary | ICD-10-CM | POA: Diagnosis not present

## 2023-03-24 DIAGNOSIS — L570 Actinic keratosis: Secondary | ICD-10-CM | POA: Diagnosis not present

## 2023-03-24 DIAGNOSIS — L853 Xerosis cutis: Secondary | ICD-10-CM | POA: Diagnosis not present

## 2023-03-24 DIAGNOSIS — L821 Other seborrheic keratosis: Secondary | ICD-10-CM | POA: Diagnosis not present

## 2023-03-24 DIAGNOSIS — D225 Melanocytic nevi of trunk: Secondary | ICD-10-CM | POA: Diagnosis not present

## 2023-03-24 DIAGNOSIS — L905 Scar conditions and fibrosis of skin: Secondary | ICD-10-CM | POA: Diagnosis not present

## 2023-04-01 DIAGNOSIS — L98429 Non-pressure chronic ulcer of back with unspecified severity: Secondary | ICD-10-CM | POA: Diagnosis not present

## 2023-04-01 DIAGNOSIS — D485 Neoplasm of uncertain behavior of skin: Secondary | ICD-10-CM | POA: Diagnosis not present

## 2023-05-13 DIAGNOSIS — M7989 Other specified soft tissue disorders: Secondary | ICD-10-CM | POA: Diagnosis not present

## 2023-05-13 DIAGNOSIS — I872 Venous insufficiency (chronic) (peripheral): Secondary | ICD-10-CM | POA: Diagnosis not present

## 2023-05-13 DIAGNOSIS — M79605 Pain in left leg: Secondary | ICD-10-CM | POA: Diagnosis not present

## 2023-05-13 DIAGNOSIS — I82542 Chronic embolism and thrombosis of left tibial vein: Secondary | ICD-10-CM | POA: Diagnosis not present

## 2023-05-13 DIAGNOSIS — I83812 Varicose veins of left lower extremities with pain: Secondary | ICD-10-CM | POA: Diagnosis not present

## 2023-05-15 DIAGNOSIS — H524 Presbyopia: Secondary | ICD-10-CM | POA: Diagnosis not present

## 2023-05-15 DIAGNOSIS — H52223 Regular astigmatism, bilateral: Secondary | ICD-10-CM | POA: Diagnosis not present

## 2023-05-15 DIAGNOSIS — H5213 Myopia, bilateral: Secondary | ICD-10-CM | POA: Diagnosis not present

## 2023-05-20 DIAGNOSIS — I83892 Varicose veins of left lower extremities with other complications: Secondary | ICD-10-CM | POA: Diagnosis not present

## 2023-06-03 DIAGNOSIS — M7989 Other specified soft tissue disorders: Secondary | ICD-10-CM | POA: Diagnosis not present

## 2023-06-03 DIAGNOSIS — I83812 Varicose veins of left lower extremities with pain: Secondary | ICD-10-CM | POA: Diagnosis not present

## 2023-06-06 DIAGNOSIS — T7840XA Allergy, unspecified, initial encounter: Secondary | ICD-10-CM | POA: Diagnosis not present

## 2023-06-09 DIAGNOSIS — M25531 Pain in right wrist: Secondary | ICD-10-CM | POA: Diagnosis not present

## 2023-06-12 DIAGNOSIS — M13842 Other specified arthritis, left hand: Secondary | ICD-10-CM | POA: Diagnosis not present

## 2023-06-12 DIAGNOSIS — M654 Radial styloid tenosynovitis [de Quervain]: Secondary | ICD-10-CM | POA: Diagnosis not present

## 2023-06-12 DIAGNOSIS — M19042 Primary osteoarthritis, left hand: Secondary | ICD-10-CM | POA: Diagnosis not present

## 2023-06-16 DIAGNOSIS — I83812 Varicose veins of left lower extremities with pain: Secondary | ICD-10-CM | POA: Diagnosis not present

## 2023-06-16 DIAGNOSIS — M7989 Other specified soft tissue disorders: Secondary | ICD-10-CM | POA: Diagnosis not present

## 2023-06-16 DIAGNOSIS — I83892 Varicose veins of left lower extremities with other complications: Secondary | ICD-10-CM | POA: Diagnosis not present

## 2023-09-09 DIAGNOSIS — I83892 Varicose veins of left lower extremities with other complications: Secondary | ICD-10-CM | POA: Diagnosis not present

## 2023-09-09 DIAGNOSIS — I10 Essential (primary) hypertension: Secondary | ICD-10-CM | POA: Diagnosis not present

## 2023-09-09 DIAGNOSIS — R600 Localized edema: Secondary | ICD-10-CM | POA: Diagnosis not present

## 2023-09-09 DIAGNOSIS — I87392 Chronic venous hypertension (idiopathic) with other complications of left lower extremity: Secondary | ICD-10-CM | POA: Diagnosis not present

## 2023-09-26 ENCOUNTER — Telehealth: Payer: Self-pay | Admitting: Psychiatry

## 2023-09-26 DIAGNOSIS — F5101 Primary insomnia: Secondary | ICD-10-CM

## 2023-09-26 NOTE — Telephone Encounter (Signed)
 She needs appt with a new provider, has made no attempt to get appt. Was last seen in November.

## 2023-09-26 NOTE — Telephone Encounter (Signed)
 Pt called requesting Rx for Trazodone  & Dayvigo    HT [address] Previous pt of Harlene Pepper. Wants to stay with [facility]. Needs follow up apt.

## 2023-09-26 NOTE — Telephone Encounter (Signed)
 Dayvigo  LF 4/2 for 90 days.

## 2023-09-26 NOTE — Telephone Encounter (Signed)
 LVM to [facility]

## 2023-09-29 ENCOUNTER — Other Ambulatory Visit: Payer: Self-pay

## 2023-09-29 DIAGNOSIS — F5101 Primary insomnia: Secondary | ICD-10-CM

## 2023-09-29 MED ORDER — TRAZODONE HCL 100 MG PO TABS
ORAL_TABLET | ORAL | 0 refills | Status: DC
Start: 2023-09-29 — End: 2023-10-16

## 2023-09-29 NOTE — Telephone Encounter (Signed)
 Pended Dayvigo .

## 2023-09-29 NOTE — Addendum Note (Signed)
 Addended by: CON BRISTLE T on: 09/29/2023 02:07 PM   Modules accepted: Orders

## 2023-09-29 NOTE — Telephone Encounter (Signed)
 Needs an appt before RF can be granted.  Ok to schedule with [facility] as available.

## 2023-09-30 DIAGNOSIS — Z09 Encounter for follow-up examination after completed treatment for conditions other than malignant neoplasm: Secondary | ICD-10-CM | POA: Diagnosis not present

## 2023-09-30 DIAGNOSIS — I83892 Varicose veins of left lower extremities with other complications: Secondary | ICD-10-CM | POA: Diagnosis not present

## 2023-10-03 ENCOUNTER — Other Ambulatory Visit: Payer: Self-pay

## 2023-10-03 ENCOUNTER — Telehealth: Payer: Self-pay | Admitting: Adult Health

## 2023-10-03 DIAGNOSIS — F5101 Primary insomnia: Secondary | ICD-10-CM

## 2023-10-03 MED ORDER — DAYVIGO 10 MG PO TABS: 10.0000 mg | ORAL_TABLET | Freq: Every day | ORAL | 0 refills | Status: DC

## 2023-10-03 NOTE — Telephone Encounter (Signed)
 Pended enough until appt.

## 2023-10-03 NOTE — Telephone Encounter (Signed)
 Pt made an appt for Aug 7 with Gina. She is out of Dayvigo  10mg  . Can we send it in for her?  [facility] [id number] - [place], [place] - [address] [facility] [address]  [address] [place], [place] [address]

## 2023-10-07 DIAGNOSIS — I83892 Varicose veins of left lower extremities with other complications: Secondary | ICD-10-CM | POA: Diagnosis not present

## 2023-10-16 ENCOUNTER — Ambulatory Visit: Admitting: Adult Health

## 2023-10-16 ENCOUNTER — Encounter: Payer: Self-pay | Admitting: Adult Health

## 2023-10-16 DIAGNOSIS — F5101 Primary insomnia: Secondary | ICD-10-CM

## 2023-10-16 DIAGNOSIS — G4700 Insomnia, unspecified: Secondary | ICD-10-CM | POA: Diagnosis not present

## 2023-10-16 DIAGNOSIS — F32A Depression, unspecified: Secondary | ICD-10-CM

## 2023-10-16 DIAGNOSIS — F3342 Major depressive disorder, recurrent, in full remission: Secondary | ICD-10-CM

## 2023-10-16 DIAGNOSIS — F419 Anxiety disorder, unspecified: Secondary | ICD-10-CM

## 2023-10-16 DIAGNOSIS — F411 Generalized anxiety disorder: Secondary | ICD-10-CM

## 2023-10-16 MED ORDER — TRAZODONE HCL 100 MG PO TABS: ORAL_TABLET | ORAL | 1 refills | Status: AC

## 2023-10-16 MED ORDER — DESVENLAFAXINE SUCCINATE ER 100 MG PO TB24: ORAL_TABLET | ORAL | 1 refills | Status: AC

## 2023-10-16 MED ORDER — DESVENLAFAXINE SUCCINATE ER 50 MG PO TB24: ORAL_TABLET | ORAL | 1 refills | Status: AC

## 2023-10-16 MED ORDER — DEPLIN 15 15-90.314 MG PO CAPS: 1.0000 | ORAL_CAPSULE | Freq: Every day | ORAL | 3 refills | Status: DC

## 2023-10-16 MED ORDER — DAYVIGO 10 MG PO TABS: 10.0000 mg | ORAL_TABLET | Freq: Every day | ORAL | 1 refills | Status: DC

## 2023-10-16 MED ORDER — PROPRANOLOL HCL 10 MG PO TABS: 10.0000 mg | ORAL_TABLET | Freq: Two times a day (BID) | ORAL | 1 refills | Status: AC

## 2023-10-16 NOTE — Progress Notes (Signed)
 Debbie Schultz [id number] June 14, 1950 73 y.o.  Virtual Visit via Video Note  I connected with pt @ on 10/16/23 at 10:00 AM EDT by a video enabled telemedicine application and verified that I am speaking with the correct person using two identifiers.   I discussed the limitations of evaluation and management by telemedicine and the availability of in person appointments. The patient expressed understanding and agreed to proceed.  I discussed the assessment and treatment plan with the patient. The patient was provided an opportunity to ask questions and all were answered. The patient agreed with the plan and demonstrated an understanding of the instructions.   The patient was advised to call back or seek an in-person evaluation if the symptoms worsen or if the condition fails to improve as anticipated.  I provided 25 minutes of non-face-to-face time during this encounter.  The patient was located at home.  The provider was located at [facility].   Debbie LOISE Sayers, NP   Subjective:   Patient ID:  Debbie Schultz is a 73 y.o. (DOB 01-22-1951) female.  Chief Complaint: No chief complaint on file.   HPI [name] presents for follow-up of of anxiety, depression, and insomnia.   Describes mood today as ok. Pleasant. Denies tearfulness. Mood symptoms - depression - reports 8 to 9 out 10. Reports stable interest and motivation. Denies anxiety and irritability. Denies panic attacks. Denies worry, rumination and over thinking. Denies obsessive thoughts or acts. Reports mood as stable. Taking medications as prescribed.  Energy levels stable. Active, has a regular exercise routine.  Enjoys some usual interests and activities. Single. Lives with a sister - cat. Spending time with family. Appetite adequate. Reports weight loss. Sleeps well most nights. Averages 8 hours. Focus and concentration stable. Completing tasks. Managing aspects of household. Retired. Denies SI or  HI.  Denies AH or VH. Denies self harm. Denies substance use.  Previous medication trials:  Silenor  Temazepam  Effexor   Review of Systems:  Review of Systems  Musculoskeletal:  Negative for gait problem.  Neurological:  Negative for tremors.  Psychiatric/Behavioral:         Please refer to HPI    Medications: I have reviewed the patient's current medications.  Current Outpatient Medications  Medication Sig Dispense Refill   Ascorbic Acid (VITAMIN C ER PO) Take by mouth.     Cholecalciferol (VITAMIN D3) 10000 units TABS Take by mouth.     desvenlafaxine  (PRISTIQ ) 100 MG 24 hr tablet TAKE 1 TABLET BY MOUTH DAILY **TAKE ALONG WITH 50MG  TABLET TO EQUAL 150MG  DAILY** 90 tablet 1   desvenlafaxine  (PRISTIQ ) 50 MG 24 hr tablet TAKE 1 TABLET BY MOUTH DAILY **TAKE WITH 100MG  TABLET TO EQUAL 150MG  DAILY** 90 tablet 1   fluticasone  (FLONASE ) 50 MCG/ACT nasal spray Place 1 spray into both nostrils daily. (Patient not taking: Reported on 01/14/2023)  0   L-Methylfolate-Algae (DEPLIN 15) 15-90.314 MG CAPS TAKE 1 CAPSULE BY MOUTH DAILY 90 capsule 3   Lemborexant  (DAYVIGO ) 10 MG TABS Take 1 tablet (10 mg total) by mouth at bedtime. 14 tablet 0   liothyronine  (CYTOMEL ) 5 MCG tablet Take 5 mcg by mouth daily.     methocarbamol (ROBAXIN) 750 MG tablet Take 750 mg by mouth at bedtime.     OMEGA-3 FATTY ACIDS PO Take by mouth.     propranolol  (INDERAL ) 10 MG tablet Take 1 tablet (10 mg total) by mouth 2 (two) times daily. 180 tablet 2   traZODone  (DESYREL ) 100 MG tablet  TAKE 1 TO 2 TABLETS BY MOUTH AT BEDTIME FOR SLEEP 60 tablet 0   zinc gluconate 50 MG tablet Take 50 mg by mouth daily.     No current facility-administered medications for this visit.    Medication Side Effects: None  Allergies:  Allergies  Allergen Reactions   Other Nausea And Vomiting    lobster   Symbicort  [Budesonide -Formoterol  Fumarate] Nausea Only   Bupropion Rash   Sulfamethoxazole Rash    Past Medical History:   Diagnosis Date   Anxiety    Arthritis    bilateral knees   Back pain    Depression    Dysrhythmia    high heart rate.  seen last Dec 2018   Headache    resolved   Hypothyroid    Insomnia     Family History  Problem Relation Age of Onset   Heart disease Mother        PPM   Heart attack Father    Diabetes Maternal Aunt    Stroke Paternal Grandmother    Headache Neg Hx     Social History   Socioeconomic History   Marital status: Unknown    Spouse name: Not on file   Number of children: 0   Years of education: Masters   Highest education level: Not on file  Occupational History   Occupation: Retired    Comment: Runner, broadcasting/film/video  Tobacco Use   Smoking status: Never   Smokeless tobacco: Never  Vaping Use   Vaping status: Never Used  Substance and Sexual Activity   Alcohol use: Yes    Alcohol/week: 0.0 standard drinks of alcohol    Comment: once a month   Drug use: No   Sexual activity: Not on file  Other Topics Concern   Not on file  Social History Narrative   Lives with mother.   Caffeine use: 1 cup coffee every morning   Tea rare   Social Drivers of Corporate investment banker Strain: Not on file  Food Insecurity: Not on file  Transportation Needs: Not on file  Physical Activity: Not on file  Stress: Not on file  Social Connections: Unknown (07/23/2021)   [facility]   Social Network    Social Network: Not on file  Intimate Partner Violence: Unknown (06/14/2021)   Received from [facility]   HITS    Physically Hurt: Not on file    Insult or Talk Down To: Not on file    Threaten Physical Harm: Not on file    Scream or Curse: Not on file    Past Medical History, Surgical history, Social history, and Family history were reviewed and updated as appropriate.   Please see review of systems for further details on the patient's review from today.   Objective:   Physical Exam:  There were no vitals taken for this visit.  Physical  Exam Constitutional:      General: She is not in acute distress. Musculoskeletal:        General: No deformity.  Neurological:     Mental Status: She is alert and oriented to person, place, and time.     Coordination: Coordination normal.  Psychiatric:        Attention and Perception: Attention and perception normal. She does not perceive auditory or visual hallucinations.        Mood and Affect: Mood normal. Mood is not anxious or depressed. Affect is not labile, blunt, angry or inappropriate.  Speech: Speech normal.        Behavior: Behavior normal.        Thought Content: Thought content normal. Thought content is not paranoid or delusional. Thought content does not include homicidal or suicidal ideation. Thought content does not include homicidal or suicidal plan.        Cognition and Memory: Cognition and memory normal.        Judgment: Judgment normal.     Comments: Insight intact     Lab Review:     Component Value Date/Time   NA 140 10/07/2021 0536   K 4.1 10/07/2021 0536   CL 107 10/07/2021 0536   CO2 28 10/07/2021 0536   GLUCOSE 122 (H) 10/07/2021 0536   BUN 7 (L) 10/07/2021 0536   CREATININE 0.89 10/07/2021 0536   CALCIUM 9.0 10/07/2021 0536   PROT 5.9 (L) 10/07/2021 0536   ALBUMIN 3.2 (L) 10/07/2021 0536   AST 36 10/07/2021 0536   ALT 143 (H) 10/07/2021 0536   ALKPHOS 112 10/07/2021 0536   BILITOT 1.0 10/07/2021 0536   GFRNONAA >60 10/07/2021 0536   GFRAA >60 03/19/2018 2330       Component Value Date/Time   WBC 6.4 10/06/2021 0253   RBC 4.63 10/06/2021 0253   HGB 14.5 10/06/2021 0253   HCT 44.0 10/06/2021 0253   PLT 271 10/06/2021 0253   MCV 95.0 10/06/2021 0253   MCH 31.3 10/06/2021 0253   MCHC 33.0 10/06/2021 0253   RDW 12.0 10/06/2021 0253   LYMPHSABS 2.2 03/19/2018 2330   MONOABS 0.6 03/19/2018 2330   EOSABS 0.2 03/19/2018 2330   BASOSABS 0.1 03/19/2018 2330    No results found for: POCLITH, LITHIUM    No results found for:  PHENYTOIN, PHENOBARB, VALPROATE, CBMZ   .res Assessment: Plan:    Will continue current plan of care since target signs and symptoms are well controlled without any tolerability issues.  Pristiq  150 mg daily for depression and anxiety.  Trazodone  100 mg 1-2 tabs at bedtime as needed for insomnia.  Propranolol  10 mg po BID for anxiety.  Dayvigo  10mg  at bedtime  Deplin 15mg  daily  Pt to follow-up in 6 months or sooner if clinically indicated.   25 minutes spent dedicated to the care of this patient on the date of this encounter to include pre-visit review of records, ordering of medication, post visit documentation, and face-to-face time with the patient discussing anxiety, depression, and insomnia. Discussed continuing current medication regimen.  Patient advised to contact office with any questions, adverse effects, or acute worsening in signs and symptoms.  There are no diagnoses linked to this encounter.   Please see After Visit Summary for patient specific instructions.  Future Appointments  Date Time Provider Department Center  10/16/2023 10:00 AM Areeb Corron Nattalie, NP [facility] None    No orders of the defined types were placed in this encounter.     -------------------------------

## 2023-10-21 DIAGNOSIS — I83892 Varicose veins of left lower extremities with other complications: Secondary | ICD-10-CM | POA: Diagnosis not present

## 2023-10-21 DIAGNOSIS — I82412 Acute embolism and thrombosis of left femoral vein: Secondary | ICD-10-CM | POA: Diagnosis not present

## 2023-10-27 DIAGNOSIS — M13841 Other specified arthritis, right hand: Secondary | ICD-10-CM | POA: Diagnosis not present

## 2023-10-27 DIAGNOSIS — M654 Radial styloid tenosynovitis [de Quervain]: Secondary | ICD-10-CM | POA: Diagnosis not present

## 2023-10-27 DIAGNOSIS — M13842 Other specified arthritis, left hand: Secondary | ICD-10-CM | POA: Diagnosis not present

## 2023-10-27 DIAGNOSIS — M79645 Pain in left finger(s): Secondary | ICD-10-CM | POA: Diagnosis not present

## 2023-11-17 DIAGNOSIS — I8002 Phlebitis and thrombophlebitis of superficial vessels of left lower extremity: Secondary | ICD-10-CM | POA: Diagnosis not present

## 2023-11-17 DIAGNOSIS — M79605 Pain in left leg: Secondary | ICD-10-CM | POA: Diagnosis not present

## 2023-11-17 DIAGNOSIS — I83812 Varicose veins of left lower extremities with pain: Secondary | ICD-10-CM | POA: Diagnosis not present

## 2023-11-19 ENCOUNTER — Other Ambulatory Visit: Payer: Self-pay

## 2023-11-19 DIAGNOSIS — F3342 Major depressive disorder, recurrent, in full remission: Secondary | ICD-10-CM

## 2023-11-19 MED ORDER — DEPLIN 15 15-90.314 MG PO CAPS: 1.0000 | ORAL_CAPSULE | Freq: Every day | ORAL | 3 refills | Status: AC

## 2024-01-02 DIAGNOSIS — R051 Acute cough: Secondary | ICD-10-CM | POA: Diagnosis not present

## 2024-01-02 DIAGNOSIS — J069 Acute upper respiratory infection, unspecified: Secondary | ICD-10-CM | POA: Diagnosis not present

## 2024-01-22 DIAGNOSIS — Z1231 Encounter for screening mammogram for malignant neoplasm of breast: Secondary | ICD-10-CM | POA: Diagnosis not present

## 2024-02-02 DIAGNOSIS — Z1331 Encounter for screening for depression: Secondary | ICD-10-CM | POA: Diagnosis not present

## 2024-02-02 DIAGNOSIS — Z0000 Encounter for general adult medical examination without abnormal findings: Secondary | ICD-10-CM | POA: Diagnosis not present

## 2024-02-17 DIAGNOSIS — M7989 Other specified soft tissue disorders: Secondary | ICD-10-CM | POA: Diagnosis not present

## 2024-02-17 DIAGNOSIS — I83892 Varicose veins of left lower extremities with other complications: Secondary | ICD-10-CM | POA: Diagnosis not present

## 2024-02-17 DIAGNOSIS — R600 Localized edema: Secondary | ICD-10-CM | POA: Diagnosis not present

## 2024-02-17 DIAGNOSIS — I83812 Varicose veins of left lower extremities with pain: Secondary | ICD-10-CM | POA: Diagnosis not present

## 2024-02-24 DIAGNOSIS — M5136 Other intervertebral disc degeneration, lumbar region with discogenic back pain only: Secondary | ICD-10-CM | POA: Diagnosis not present

## 2024-04-11 ENCOUNTER — Other Ambulatory Visit: Payer: Self-pay | Admitting: Adult Health

## 2024-04-11 DIAGNOSIS — F5101 Primary insomnia: Secondary | ICD-10-CM

## 2024-04-26 ENCOUNTER — Ambulatory Visit: Admitting: Adult Health
